# Patient Record
Sex: Female | Born: 1935 | Race: White | Hispanic: No | Smoking: Former smoker
Health system: Southern US, Community
[De-identification: ages and names within clinical notes are randomized; demographics above are authoritative.]

## PROBLEM LIST (undated history)

## (undated) DIAGNOSIS — C679 Malignant neoplasm of bladder, unspecified: Secondary | ICD-10-CM

## (undated) DIAGNOSIS — I739 Peripheral vascular disease, unspecified: Secondary | ICD-10-CM

## (undated) DIAGNOSIS — H3530 Unspecified macular degeneration: Secondary | ICD-10-CM

## (undated) DIAGNOSIS — M316 Other giant cell arteritis: Secondary | ICD-10-CM

## (undated) DIAGNOSIS — D649 Anemia, unspecified: Secondary | ICD-10-CM

## (undated) DIAGNOSIS — I700 Atherosclerosis of aorta: Secondary | ICD-10-CM

## (undated) DIAGNOSIS — E785 Hyperlipidemia, unspecified: Secondary | ICD-10-CM

## (undated) DIAGNOSIS — Z955 Presence of coronary angioplasty implant and graft: Secondary | ICD-10-CM

## (undated) DIAGNOSIS — E119 Type 2 diabetes mellitus without complications: Secondary | ICD-10-CM

## (undated) DIAGNOSIS — I10 Essential (primary) hypertension: Secondary | ICD-10-CM

## (undated) DIAGNOSIS — M810 Age-related osteoporosis without current pathological fracture: Secondary | ICD-10-CM

## (undated) DIAGNOSIS — M069 Rheumatoid arthritis, unspecified: Secondary | ICD-10-CM

## (undated) DIAGNOSIS — Z87891 Personal history of nicotine dependence: Secondary | ICD-10-CM

## (undated) DIAGNOSIS — I519 Heart disease, unspecified: Secondary | ICD-10-CM

## (undated) DIAGNOSIS — I2510 Atherosclerotic heart disease of native coronary artery without angina pectoris: Secondary | ICD-10-CM

## (undated) HISTORY — PX: CATARACT EXTRACTION: SUR2

## (undated) HISTORY — PX: AORTO-FEMORAL BYPASS GRAFT: SHX885

## (undated) HISTORY — DX: Anemia, unspecified: D64.9

## (undated) HISTORY — DX: Other giant cell arteritis: M31.6

## (undated) HISTORY — DX: Personal history of nicotine dependence: Z87.891

## (undated) HISTORY — DX: Heart disease, unspecified: I51.9

## (undated) HISTORY — DX: Peripheral vascular disease, unspecified: I73.9

## (undated) HISTORY — DX: Presence of coronary angioplasty implant and graft: Z95.5

## (undated) HISTORY — DX: Malignant neoplasm of bladder, unspecified: C67.9

## (undated) HISTORY — DX: Type 2 diabetes mellitus without complications: E11.9

## (undated) HISTORY — DX: Age-related osteoporosis without current pathological fracture: M81.0

## (undated) HISTORY — PX: HERNIA REPAIR: SHX51

## (undated) HISTORY — DX: Atherosclerosis of aorta: I70.0

## (undated) HISTORY — DX: Unspecified macular degeneration: H35.30

## (undated) HISTORY — DX: Hyperlipidemia, unspecified: E78.5

## (undated) HISTORY — DX: Essential (primary) hypertension: I10

## (undated) HISTORY — DX: Rheumatoid arthritis, unspecified: M06.9

## (undated) HISTORY — DX: Atherosclerotic heart disease of native coronary artery without angina pectoris: I25.10

## (undated) LAB — HM DIABETES EYE EXAM

---

## 2010-02-10 DIAGNOSIS — C679 Malignant neoplasm of bladder, unspecified: Secondary | ICD-10-CM | POA: Insufficient documentation

## 2010-02-10 HISTORY — DX: Malignant neoplasm of bladder, unspecified: C67.9

## 2011-04-06 HISTORY — PX: KIDNEY SURGERY: SHX687

## 2011-05-05 HISTORY — PX: URETER SURGERY: SHX823

## 2016-02-10 DIAGNOSIS — Z0000 Encounter for general adult medical examination without abnormal findings: Secondary | ICD-10-CM | POA: Diagnosis not present

## 2016-02-18 DIAGNOSIS — I259 Chronic ischemic heart disease, unspecified: Secondary | ICD-10-CM | POA: Diagnosis not present

## 2016-02-18 DIAGNOSIS — F79 Unspecified intellectual disabilities: Secondary | ICD-10-CM | POA: Diagnosis not present

## 2016-02-18 DIAGNOSIS — D519 Vitamin B12 deficiency anemia, unspecified: Secondary | ICD-10-CM | POA: Diagnosis not present

## 2016-02-18 DIAGNOSIS — N390 Urinary tract infection, site not specified: Secondary | ICD-10-CM | POA: Diagnosis not present

## 2016-02-18 DIAGNOSIS — N183 Chronic kidney disease, stage 3 (moderate): Secondary | ICD-10-CM | POA: Diagnosis not present

## 2016-02-18 DIAGNOSIS — E119 Type 2 diabetes mellitus without complications: Secondary | ICD-10-CM | POA: Diagnosis not present

## 2016-02-18 DIAGNOSIS — I739 Peripheral vascular disease, unspecified: Secondary | ICD-10-CM | POA: Diagnosis not present

## 2016-02-26 DIAGNOSIS — M316 Other giant cell arteritis: Secondary | ICD-10-CM | POA: Diagnosis not present

## 2016-03-01 DIAGNOSIS — I70203 Unspecified atherosclerosis of native arteries of extremities, bilateral legs: Secondary | ICD-10-CM | POA: Diagnosis not present

## 2016-03-01 DIAGNOSIS — I6529 Occlusion and stenosis of unspecified carotid artery: Secondary | ICD-10-CM | POA: Diagnosis not present

## 2016-03-01 DIAGNOSIS — I739 Peripheral vascular disease, unspecified: Secondary | ICD-10-CM | POA: Diagnosis not present

## 2016-03-09 DIAGNOSIS — Z961 Presence of intraocular lens: Secondary | ICD-10-CM | POA: Diagnosis not present

## 2016-03-09 DIAGNOSIS — C679 Malignant neoplasm of bladder, unspecified: Secondary | ICD-10-CM | POA: Diagnosis not present

## 2016-03-09 DIAGNOSIS — H35372 Puckering of macula, left eye: Secondary | ICD-10-CM | POA: Diagnosis not present

## 2016-03-09 DIAGNOSIS — R8299 Other abnormal findings in urine: Secondary | ICD-10-CM | POA: Diagnosis not present

## 2016-03-09 DIAGNOSIS — H353131 Nonexudative age-related macular degeneration, bilateral, early dry stage: Secondary | ICD-10-CM | POA: Diagnosis not present

## 2016-03-10 DIAGNOSIS — Z0000 Encounter for general adult medical examination without abnormal findings: Secondary | ICD-10-CM | POA: Diagnosis not present

## 2016-03-24 DIAGNOSIS — C679 Malignant neoplasm of bladder, unspecified: Secondary | ICD-10-CM | POA: Diagnosis not present

## 2016-06-15 DIAGNOSIS — I739 Peripheral vascular disease, unspecified: Secondary | ICD-10-CM | POA: Diagnosis not present

## 2016-06-15 DIAGNOSIS — E119 Type 2 diabetes mellitus without complications: Secondary | ICD-10-CM | POA: Diagnosis not present

## 2016-06-15 DIAGNOSIS — Z6822 Body mass index (BMI) 22.0-22.9, adult: Secondary | ICD-10-CM | POA: Diagnosis not present

## 2016-06-15 DIAGNOSIS — I10 Essential (primary) hypertension: Secondary | ICD-10-CM | POA: Diagnosis not present

## 2016-06-25 DIAGNOSIS — R52 Pain, unspecified: Secondary | ICD-10-CM | POA: Diagnosis not present

## 2016-06-25 DIAGNOSIS — S43421D Sprain of right rotator cuff capsule, subsequent encounter: Secondary | ICD-10-CM | POA: Diagnosis not present

## 2016-06-29 DIAGNOSIS — M25562 Pain in left knee: Secondary | ICD-10-CM | POA: Diagnosis not present

## 2016-06-29 DIAGNOSIS — G8929 Other chronic pain: Secondary | ICD-10-CM | POA: Diagnosis not present

## 2016-06-29 DIAGNOSIS — M25561 Pain in right knee: Secondary | ICD-10-CM | POA: Diagnosis not present

## 2016-07-14 DIAGNOSIS — G8929 Other chronic pain: Secondary | ICD-10-CM | POA: Diagnosis not present

## 2016-07-14 DIAGNOSIS — M25561 Pain in right knee: Secondary | ICD-10-CM | POA: Diagnosis not present

## 2016-07-14 DIAGNOSIS — M25562 Pain in left knee: Secondary | ICD-10-CM | POA: Diagnosis not present

## 2016-07-14 DIAGNOSIS — S43421D Sprain of right rotator cuff capsule, subsequent encounter: Secondary | ICD-10-CM | POA: Diagnosis not present

## 2016-08-25 DIAGNOSIS — Z1231 Encounter for screening mammogram for malignant neoplasm of breast: Secondary | ICD-10-CM | POA: Diagnosis not present

## 2016-09-02 DIAGNOSIS — I739 Peripheral vascular disease, unspecified: Secondary | ICD-10-CM | POA: Diagnosis not present

## 2016-09-02 DIAGNOSIS — I6523 Occlusion and stenosis of bilateral carotid arteries: Secondary | ICD-10-CM | POA: Diagnosis not present

## 2016-09-02 DIAGNOSIS — I70311 Atherosclerosis of unspecified type of bypass graft(s) of the extremities with intermittent claudication, right leg: Secondary | ICD-10-CM | POA: Diagnosis not present

## 2016-09-20 DIAGNOSIS — H538 Other visual disturbances: Secondary | ICD-10-CM | POA: Diagnosis not present

## 2016-09-20 DIAGNOSIS — H35372 Puckering of macula, left eye: Secondary | ICD-10-CM | POA: Diagnosis not present

## 2016-09-20 DIAGNOSIS — H353131 Nonexudative age-related macular degeneration, bilateral, early dry stage: Secondary | ICD-10-CM | POA: Diagnosis not present

## 2016-09-20 DIAGNOSIS — H04123 Dry eye syndrome of bilateral lacrimal glands: Secondary | ICD-10-CM | POA: Diagnosis not present

## 2016-09-21 DIAGNOSIS — Z823 Family history of stroke: Secondary | ICD-10-CM | POA: Diagnosis not present

## 2016-09-21 DIAGNOSIS — I10 Essential (primary) hypertension: Secondary | ICD-10-CM | POA: Diagnosis not present

## 2016-09-21 DIAGNOSIS — G43509 Persistent migraine aura without cerebral infarction, not intractable, without status migrainosus: Secondary | ICD-10-CM | POA: Diagnosis not present

## 2016-09-21 DIAGNOSIS — Z888 Allergy status to other drugs, medicaments and biological substances status: Secondary | ICD-10-CM | POA: Diagnosis not present

## 2016-09-21 DIAGNOSIS — R110 Nausea: Secondary | ICD-10-CM | POA: Diagnosis not present

## 2016-09-21 DIAGNOSIS — Z906 Acquired absence of other parts of urinary tract: Secondary | ICD-10-CM | POA: Diagnosis not present

## 2016-09-21 DIAGNOSIS — G43501 Persistent migraine aura without cerebral infarction, not intractable, with status migrainosus: Secondary | ICD-10-CM | POA: Diagnosis not present

## 2016-09-21 DIAGNOSIS — K219 Gastro-esophageal reflux disease without esophagitis: Secondary | ICD-10-CM | POA: Diagnosis not present

## 2016-09-21 DIAGNOSIS — H538 Other visual disturbances: Secondary | ICD-10-CM | POA: Diagnosis not present

## 2016-09-21 DIAGNOSIS — E119 Type 2 diabetes mellitus without complications: Secondary | ICD-10-CM | POA: Diagnosis not present

## 2016-09-21 DIAGNOSIS — R9082 White matter disease, unspecified: Secondary | ICD-10-CM | POA: Diagnosis not present

## 2016-09-21 DIAGNOSIS — F411 Generalized anxiety disorder: Secondary | ICD-10-CM | POA: Diagnosis not present

## 2016-09-21 DIAGNOSIS — I2510 Atherosclerotic heart disease of native coronary artery without angina pectoris: Secondary | ICD-10-CM | POA: Diagnosis not present

## 2016-09-21 DIAGNOSIS — Z8554 Personal history of malignant neoplasm of ureter: Secondary | ICD-10-CM | POA: Diagnosis not present

## 2016-09-21 DIAGNOSIS — E785 Hyperlipidemia, unspecified: Secondary | ICD-10-CM | POA: Diagnosis not present

## 2016-09-21 DIAGNOSIS — Z8551 Personal history of malignant neoplasm of bladder: Secondary | ICD-10-CM | POA: Diagnosis not present

## 2016-09-21 DIAGNOSIS — R51 Headache: Secondary | ICD-10-CM | POA: Diagnosis not present

## 2016-09-21 DIAGNOSIS — Z7984 Long term (current) use of oral hypoglycemic drugs: Secondary | ICD-10-CM | POA: Diagnosis not present

## 2016-09-23 DIAGNOSIS — E785 Hyperlipidemia, unspecified: Secondary | ICD-10-CM | POA: Diagnosis not present

## 2016-09-23 DIAGNOSIS — Z79899 Other long term (current) drug therapy: Secondary | ICD-10-CM | POA: Diagnosis not present

## 2016-09-23 DIAGNOSIS — Z7984 Long term (current) use of oral hypoglycemic drugs: Secondary | ICD-10-CM | POA: Diagnosis not present

## 2016-09-23 DIAGNOSIS — Z01812 Encounter for preprocedural laboratory examination: Secondary | ICD-10-CM | POA: Diagnosis not present

## 2016-09-23 DIAGNOSIS — Z01810 Encounter for preprocedural cardiovascular examination: Secondary | ICD-10-CM | POA: Diagnosis not present

## 2016-09-23 DIAGNOSIS — Z7982 Long term (current) use of aspirin: Secondary | ICD-10-CM | POA: Diagnosis not present

## 2016-09-23 DIAGNOSIS — Z955 Presence of coronary angioplasty implant and graft: Secondary | ICD-10-CM | POA: Diagnosis not present

## 2016-09-23 DIAGNOSIS — E119 Type 2 diabetes mellitus without complications: Secondary | ICD-10-CM | POA: Diagnosis not present

## 2016-09-23 DIAGNOSIS — I10 Essential (primary) hypertension: Secondary | ICD-10-CM | POA: Diagnosis not present

## 2016-09-23 DIAGNOSIS — I2510 Atherosclerotic heart disease of native coronary artery without angina pectoris: Secondary | ICD-10-CM | POA: Diagnosis not present

## 2016-09-23 DIAGNOSIS — I70311 Atherosclerosis of unspecified type of bypass graft(s) of the extremities with intermittent claudication, right leg: Secondary | ICD-10-CM | POA: Diagnosis not present

## 2016-09-24 DIAGNOSIS — I70211 Atherosclerosis of native arteries of extremities with intermittent claudication, right leg: Secondary | ICD-10-CM | POA: Diagnosis not present

## 2016-09-24 DIAGNOSIS — E785 Hyperlipidemia, unspecified: Secondary | ICD-10-CM | POA: Diagnosis not present

## 2016-09-24 DIAGNOSIS — E119 Type 2 diabetes mellitus without complications: Secondary | ICD-10-CM | POA: Diagnosis not present

## 2016-09-24 DIAGNOSIS — Z955 Presence of coronary angioplasty implant and graft: Secondary | ICD-10-CM | POA: Diagnosis not present

## 2016-09-24 DIAGNOSIS — I10 Essential (primary) hypertension: Secondary | ICD-10-CM | POA: Diagnosis not present

## 2016-09-24 DIAGNOSIS — I70311 Atherosclerosis of unspecified type of bypass graft(s) of the extremities with intermittent claudication, right leg: Secondary | ICD-10-CM | POA: Diagnosis not present

## 2016-09-24 DIAGNOSIS — I2510 Atherosclerotic heart disease of native coronary artery without angina pectoris: Secondary | ICD-10-CM | POA: Diagnosis not present

## 2016-09-27 DIAGNOSIS — Z6823 Body mass index (BMI) 23.0-23.9, adult: Secondary | ICD-10-CM | POA: Diagnosis not present

## 2016-09-27 DIAGNOSIS — E119 Type 2 diabetes mellitus without complications: Secondary | ICD-10-CM | POA: Diagnosis not present

## 2016-09-27 DIAGNOSIS — I739 Peripheral vascular disease, unspecified: Secondary | ICD-10-CM | POA: Diagnosis not present

## 2016-09-27 DIAGNOSIS — R51 Headache: Secondary | ICD-10-CM | POA: Diagnosis not present

## 2016-09-27 DIAGNOSIS — I672 Cerebral atherosclerosis: Secondary | ICD-10-CM | POA: Diagnosis not present

## 2016-09-30 DIAGNOSIS — I70311 Atherosclerosis of unspecified type of bypass graft(s) of the extremities with intermittent claudication, right leg: Secondary | ICD-10-CM | POA: Diagnosis not present

## 2016-10-07 DIAGNOSIS — H538 Other visual disturbances: Secondary | ICD-10-CM | POA: Diagnosis not present

## 2016-10-07 DIAGNOSIS — R51 Headache: Secondary | ICD-10-CM | POA: Diagnosis not present

## 2016-10-12 DIAGNOSIS — Z23 Encounter for immunization: Secondary | ICD-10-CM | POA: Diagnosis not present

## 2016-10-20 DIAGNOSIS — H353131 Nonexudative age-related macular degeneration, bilateral, early dry stage: Secondary | ICD-10-CM | POA: Diagnosis not present

## 2016-10-20 DIAGNOSIS — R739 Hyperglycemia, unspecified: Secondary | ICD-10-CM | POA: Diagnosis not present

## 2016-10-20 DIAGNOSIS — H8110 Benign paroxysmal vertigo, unspecified ear: Secondary | ICD-10-CM | POA: Diagnosis not present

## 2016-10-20 DIAGNOSIS — H538 Other visual disturbances: Secondary | ICD-10-CM | POA: Diagnosis not present

## 2016-10-20 DIAGNOSIS — E1165 Type 2 diabetes mellitus with hyperglycemia: Secondary | ICD-10-CM | POA: Diagnosis not present

## 2016-10-20 DIAGNOSIS — H81399 Other peripheral vertigo, unspecified ear: Secondary | ICD-10-CM | POA: Diagnosis not present

## 2016-10-20 DIAGNOSIS — H35372 Puckering of macula, left eye: Secondary | ICD-10-CM | POA: Diagnosis not present

## 2016-10-21 DIAGNOSIS — Z6822 Body mass index (BMI) 22.0-22.9, adult: Secondary | ICD-10-CM | POA: Diagnosis not present

## 2016-10-21 DIAGNOSIS — I672 Cerebral atherosclerosis: Secondary | ICD-10-CM | POA: Diagnosis not present

## 2016-10-21 DIAGNOSIS — E119 Type 2 diabetes mellitus without complications: Secondary | ICD-10-CM | POA: Diagnosis not present

## 2016-10-25 DIAGNOSIS — E118 Type 2 diabetes mellitus with unspecified complications: Secondary | ICD-10-CM | POA: Diagnosis not present

## 2016-10-25 DIAGNOSIS — H35372 Puckering of macula, left eye: Secondary | ICD-10-CM | POA: Diagnosis not present

## 2016-10-25 DIAGNOSIS — H538 Other visual disturbances: Secondary | ICD-10-CM | POA: Diagnosis not present

## 2016-10-25 DIAGNOSIS — F419 Anxiety disorder, unspecified: Secondary | ICD-10-CM | POA: Diagnosis not present

## 2016-10-25 DIAGNOSIS — I10 Essential (primary) hypertension: Secondary | ICD-10-CM | POA: Diagnosis not present

## 2016-10-25 DIAGNOSIS — I739 Peripheral vascular disease, unspecified: Secondary | ICD-10-CM | POA: Diagnosis not present

## 2016-10-25 DIAGNOSIS — I25118 Atherosclerotic heart disease of native coronary artery with other forms of angina pectoris: Secondary | ICD-10-CM | POA: Diagnosis not present

## 2016-10-25 DIAGNOSIS — H4710 Unspecified papilledema: Secondary | ICD-10-CM | POA: Diagnosis not present

## 2016-10-25 DIAGNOSIS — E1139 Type 2 diabetes mellitus with other diabetic ophthalmic complication: Secondary | ICD-10-CM | POA: Diagnosis not present

## 2016-10-25 DIAGNOSIS — M316 Other giant cell arteritis: Secondary | ICD-10-CM | POA: Diagnosis not present

## 2016-10-26 DIAGNOSIS — K219 Gastro-esophageal reflux disease without esophagitis: Secondary | ICD-10-CM | POA: Diagnosis present

## 2016-10-26 DIAGNOSIS — Z9114 Patient's other noncompliance with medication regimen: Secondary | ICD-10-CM | POA: Diagnosis not present

## 2016-10-26 DIAGNOSIS — Z881 Allergy status to other antibiotic agents status: Secondary | ICD-10-CM | POA: Diagnosis not present

## 2016-10-26 DIAGNOSIS — I2510 Atherosclerotic heart disease of native coronary artery without angina pectoris: Secondary | ICD-10-CM | POA: Diagnosis present

## 2016-10-26 DIAGNOSIS — I776 Arteritis, unspecified: Secondary | ICD-10-CM | POA: Diagnosis not present

## 2016-10-26 DIAGNOSIS — Z859 Personal history of malignant neoplasm, unspecified: Secondary | ICD-10-CM | POA: Diagnosis not present

## 2016-10-26 DIAGNOSIS — I10 Essential (primary) hypertension: Secondary | ICD-10-CM | POA: Diagnosis present

## 2016-10-26 DIAGNOSIS — D720 Genetic anomalies of leukocytes: Secondary | ICD-10-CM | POA: Diagnosis present

## 2016-10-26 DIAGNOSIS — R718 Other abnormality of red blood cells: Secondary | ICD-10-CM | POA: Diagnosis present

## 2016-10-26 DIAGNOSIS — Z7982 Long term (current) use of aspirin: Secondary | ICD-10-CM | POA: Diagnosis not present

## 2016-10-26 DIAGNOSIS — E1151 Type 2 diabetes mellitus with diabetic peripheral angiopathy without gangrene: Secondary | ICD-10-CM | POA: Diagnosis present

## 2016-10-26 DIAGNOSIS — Z79899 Other long term (current) drug therapy: Secondary | ICD-10-CM | POA: Diagnosis not present

## 2016-10-26 DIAGNOSIS — H538 Other visual disturbances: Secondary | ICD-10-CM | POA: Diagnosis not present

## 2016-10-26 DIAGNOSIS — Z95828 Presence of other vascular implants and grafts: Secondary | ICD-10-CM | POA: Diagnosis not present

## 2016-10-26 DIAGNOSIS — Z888 Allergy status to other drugs, medicaments and biological substances status: Secondary | ICD-10-CM | POA: Diagnosis not present

## 2016-10-26 DIAGNOSIS — H53132 Sudden visual loss, left eye: Secondary | ICD-10-CM | POA: Diagnosis not present

## 2016-10-26 DIAGNOSIS — F411 Generalized anxiety disorder: Secondary | ICD-10-CM | POA: Diagnosis present

## 2016-10-26 DIAGNOSIS — E785 Hyperlipidemia, unspecified: Secondary | ICD-10-CM | POA: Diagnosis present

## 2016-10-26 DIAGNOSIS — Z7984 Long term (current) use of oral hypoglycemic drugs: Secondary | ICD-10-CM | POA: Diagnosis not present

## 2016-10-26 DIAGNOSIS — E1139 Type 2 diabetes mellitus with other diabetic ophthalmic complication: Secondary | ICD-10-CM | POA: Diagnosis present

## 2016-10-26 DIAGNOSIS — I25118 Atherosclerotic heart disease of native coronary artery with other forms of angina pectoris: Secondary | ICD-10-CM | POA: Diagnosis not present

## 2016-10-26 DIAGNOSIS — M316 Other giant cell arteritis: Secondary | ICD-10-CM | POA: Diagnosis present

## 2016-10-26 DIAGNOSIS — I739 Peripheral vascular disease, unspecified: Secondary | ICD-10-CM | POA: Diagnosis not present

## 2016-10-26 DIAGNOSIS — D649 Anemia, unspecified: Secondary | ICD-10-CM | POA: Diagnosis present

## 2016-10-26 DIAGNOSIS — E118 Type 2 diabetes mellitus with unspecified complications: Secondary | ICD-10-CM | POA: Diagnosis not present

## 2016-10-26 DIAGNOSIS — Z9889 Other specified postprocedural states: Secondary | ICD-10-CM | POA: Diagnosis not present

## 2016-11-02 DIAGNOSIS — I739 Peripheral vascular disease, unspecified: Secondary | ICD-10-CM | POA: Diagnosis present

## 2016-11-02 DIAGNOSIS — Z8551 Personal history of malignant neoplasm of bladder: Secondary | ICD-10-CM | POA: Diagnosis not present

## 2016-11-02 DIAGNOSIS — D649 Anemia, unspecified: Secondary | ICD-10-CM | POA: Diagnosis present

## 2016-11-02 DIAGNOSIS — R6884 Jaw pain: Secondary | ICD-10-CM | POA: Diagnosis present

## 2016-11-02 DIAGNOSIS — E113291 Type 2 diabetes mellitus with mild nonproliferative diabetic retinopathy without macular edema, right eye: Secondary | ICD-10-CM | POA: Diagnosis not present

## 2016-11-02 DIAGNOSIS — E1165 Type 2 diabetes mellitus with hyperglycemia: Secondary | ICD-10-CM | POA: Diagnosis present

## 2016-11-02 DIAGNOSIS — Z7982 Long term (current) use of aspirin: Secondary | ICD-10-CM | POA: Diagnosis not present

## 2016-11-02 DIAGNOSIS — M316 Other giant cell arteritis: Secondary | ICD-10-CM | POA: Diagnosis present

## 2016-11-02 DIAGNOSIS — E118 Type 2 diabetes mellitus with unspecified complications: Secondary | ICD-10-CM | POA: Diagnosis not present

## 2016-11-02 DIAGNOSIS — E785 Hyperlipidemia, unspecified: Secondary | ICD-10-CM | POA: Diagnosis present

## 2016-11-02 DIAGNOSIS — N179 Acute kidney failure, unspecified: Secondary | ICD-10-CM | POA: Diagnosis present

## 2016-11-02 DIAGNOSIS — H547 Unspecified visual loss: Secondary | ICD-10-CM | POA: Diagnosis present

## 2016-11-02 DIAGNOSIS — Z955 Presence of coronary angioplasty implant and graft: Secondary | ICD-10-CM | POA: Diagnosis not present

## 2016-11-02 DIAGNOSIS — Z7952 Long term (current) use of systemic steroids: Secondary | ICD-10-CM | POA: Diagnosis not present

## 2016-11-02 DIAGNOSIS — Z9842 Cataract extraction status, left eye: Secondary | ICD-10-CM | POA: Diagnosis not present

## 2016-11-02 DIAGNOSIS — I2510 Atherosclerotic heart disease of native coronary artery without angina pectoris: Secondary | ICD-10-CM | POA: Diagnosis present

## 2016-11-02 DIAGNOSIS — Z90711 Acquired absence of uterus with remaining cervical stump: Secondary | ICD-10-CM | POA: Diagnosis not present

## 2016-11-02 DIAGNOSIS — H47012 Ischemic optic neuropathy, left eye: Secondary | ICD-10-CM | POA: Diagnosis not present

## 2016-11-02 DIAGNOSIS — Z9841 Cataract extraction status, right eye: Secondary | ICD-10-CM | POA: Diagnosis not present

## 2016-11-02 DIAGNOSIS — Z7902 Long term (current) use of antithrombotics/antiplatelets: Secondary | ICD-10-CM | POA: Diagnosis not present

## 2016-11-02 DIAGNOSIS — Z87891 Personal history of nicotine dependence: Secondary | ICD-10-CM | POA: Diagnosis not present

## 2016-11-02 DIAGNOSIS — Z95828 Presence of other vascular implants and grafts: Secondary | ICD-10-CM | POA: Diagnosis not present

## 2016-11-02 DIAGNOSIS — I10 Essential (primary) hypertension: Secondary | ICD-10-CM | POA: Diagnosis present

## 2016-11-02 DIAGNOSIS — Z7984 Long term (current) use of oral hypoglycemic drugs: Secondary | ICD-10-CM | POA: Diagnosis not present

## 2016-11-02 DIAGNOSIS — Z951 Presence of aortocoronary bypass graft: Secondary | ICD-10-CM | POA: Diagnosis not present

## 2016-11-09 DIAGNOSIS — M2550 Pain in unspecified joint: Secondary | ICD-10-CM | POA: Diagnosis not present

## 2016-11-09 DIAGNOSIS — Z87891 Personal history of nicotine dependence: Secondary | ICD-10-CM | POA: Diagnosis not present

## 2016-11-09 DIAGNOSIS — M316 Other giant cell arteritis: Secondary | ICD-10-CM | POA: Diagnosis not present

## 2016-11-09 DIAGNOSIS — Z111 Encounter for screening for respiratory tuberculosis: Secondary | ICD-10-CM | POA: Diagnosis not present

## 2016-11-15 DIAGNOSIS — H47012 Ischemic optic neuropathy, left eye: Secondary | ICD-10-CM | POA: Diagnosis not present

## 2016-11-15 DIAGNOSIS — M316 Other giant cell arteritis: Secondary | ICD-10-CM | POA: Diagnosis not present

## 2016-11-16 DIAGNOSIS — Z7952 Long term (current) use of systemic steroids: Secondary | ICD-10-CM | POA: Diagnosis not present

## 2016-11-16 LAB — HM DEXA SCAN

## 2016-11-17 DIAGNOSIS — R5383 Other fatigue: Secondary | ICD-10-CM | POA: Diagnosis not present

## 2016-11-17 DIAGNOSIS — Z125 Encounter for screening for malignant neoplasm of prostate: Secondary | ICD-10-CM | POA: Diagnosis not present

## 2016-11-17 DIAGNOSIS — M0609 Rheumatoid arthritis without rheumatoid factor, multiple sites: Secondary | ICD-10-CM | POA: Diagnosis not present

## 2016-11-24 DIAGNOSIS — R7612 Nonspecific reaction to cell mediated immunity measurement of gamma interferon antigen response without active tuberculosis: Secondary | ICD-10-CM | POA: Diagnosis not present

## 2016-12-02 DIAGNOSIS — Z87891 Personal history of nicotine dependence: Secondary | ICD-10-CM | POA: Diagnosis not present

## 2016-12-02 DIAGNOSIS — Z7952 Long term (current) use of systemic steroids: Secondary | ICD-10-CM | POA: Diagnosis not present

## 2016-12-02 DIAGNOSIS — E119 Type 2 diabetes mellitus without complications: Secondary | ICD-10-CM | POA: Diagnosis not present

## 2016-12-02 DIAGNOSIS — M316 Other giant cell arteritis: Secondary | ICD-10-CM | POA: Diagnosis not present

## 2016-12-08 DIAGNOSIS — Z08 Encounter for follow-up examination after completed treatment for malignant neoplasm: Secondary | ICD-10-CM | POA: Diagnosis not present

## 2016-12-08 DIAGNOSIS — C44619 Basal cell carcinoma of skin of left upper limb, including shoulder: Secondary | ICD-10-CM | POA: Diagnosis not present

## 2016-12-08 DIAGNOSIS — Z85828 Personal history of other malignant neoplasm of skin: Secondary | ICD-10-CM | POA: Diagnosis not present

## 2016-12-08 DIAGNOSIS — L821 Other seborrheic keratosis: Secondary | ICD-10-CM | POA: Diagnosis not present

## 2016-12-16 DIAGNOSIS — D509 Iron deficiency anemia, unspecified: Secondary | ICD-10-CM | POA: Diagnosis not present

## 2016-12-16 DIAGNOSIS — R1013 Epigastric pain: Secondary | ICD-10-CM | POA: Diagnosis not present

## 2016-12-16 DIAGNOSIS — K5900 Constipation, unspecified: Secondary | ICD-10-CM | POA: Diagnosis not present

## 2016-12-16 DIAGNOSIS — Z86010 Personal history of colonic polyps: Secondary | ICD-10-CM | POA: Diagnosis not present

## 2016-12-16 DIAGNOSIS — R12 Heartburn: Secondary | ICD-10-CM | POA: Diagnosis not present

## 2016-12-20 DIAGNOSIS — D509 Iron deficiency anemia, unspecified: Secondary | ICD-10-CM | POA: Diagnosis not present

## 2016-12-20 DIAGNOSIS — Z7952 Long term (current) use of systemic steroids: Secondary | ICD-10-CM | POA: Diagnosis not present

## 2016-12-20 DIAGNOSIS — I10 Essential (primary) hypertension: Secondary | ICD-10-CM | POA: Diagnosis not present

## 2016-12-20 DIAGNOSIS — E785 Hyperlipidemia, unspecified: Secondary | ICD-10-CM | POA: Diagnosis not present

## 2016-12-20 DIAGNOSIS — E1165 Type 2 diabetes mellitus with hyperglycemia: Secondary | ICD-10-CM | POA: Diagnosis not present

## 2016-12-20 DIAGNOSIS — M0609 Rheumatoid arthritis without rheumatoid factor, multiple sites: Secondary | ICD-10-CM | POA: Diagnosis not present

## 2016-12-23 DIAGNOSIS — I70203 Unspecified atherosclerosis of native arteries of extremities, bilateral legs: Secondary | ICD-10-CM | POA: Diagnosis not present

## 2016-12-23 DIAGNOSIS — I70311 Atherosclerosis of unspecified type of bypass graft(s) of the extremities with intermittent claudication, right leg: Secondary | ICD-10-CM | POA: Diagnosis not present

## 2016-12-23 DIAGNOSIS — I6523 Occlusion and stenosis of bilateral carotid arteries: Secondary | ICD-10-CM | POA: Diagnosis not present

## 2016-12-27 DIAGNOSIS — H47012 Ischemic optic neuropathy, left eye: Secondary | ICD-10-CM | POA: Diagnosis not present

## 2016-12-27 DIAGNOSIS — I10 Essential (primary) hypertension: Secondary | ICD-10-CM | POA: Diagnosis not present

## 2016-12-27 DIAGNOSIS — M316 Other giant cell arteritis: Secondary | ICD-10-CM | POA: Diagnosis not present

## 2016-12-27 DIAGNOSIS — H5010 Unspecified exotropia: Secondary | ICD-10-CM | POA: Diagnosis not present

## 2016-12-29 DIAGNOSIS — E785 Hyperlipidemia, unspecified: Secondary | ICD-10-CM | POA: Diagnosis not present

## 2016-12-29 DIAGNOSIS — E1165 Type 2 diabetes mellitus with hyperglycemia: Secondary | ICD-10-CM | POA: Diagnosis not present

## 2017-01-06 DIAGNOSIS — R600 Localized edema: Secondary | ICD-10-CM | POA: Diagnosis not present

## 2017-01-06 DIAGNOSIS — Z87891 Personal history of nicotine dependence: Secondary | ICD-10-CM | POA: Diagnosis not present

## 2017-01-06 DIAGNOSIS — M316 Other giant cell arteritis: Secondary | ICD-10-CM | POA: Diagnosis not present

## 2017-01-06 DIAGNOSIS — Z7952 Long term (current) use of systemic steroids: Secondary | ICD-10-CM | POA: Diagnosis not present

## 2017-01-17 DIAGNOSIS — M316 Other giant cell arteritis: Secondary | ICD-10-CM | POA: Diagnosis not present

## 2017-01-21 DIAGNOSIS — Z7952 Long term (current) use of systemic steroids: Secondary | ICD-10-CM | POA: Diagnosis not present

## 2017-01-21 DIAGNOSIS — E1165 Type 2 diabetes mellitus with hyperglycemia: Secondary | ICD-10-CM | POA: Diagnosis not present

## 2017-01-21 DIAGNOSIS — I10 Essential (primary) hypertension: Secondary | ICD-10-CM | POA: Diagnosis not present

## 2017-01-21 DIAGNOSIS — M316 Other giant cell arteritis: Secondary | ICD-10-CM | POA: Diagnosis not present

## 2017-01-25 DIAGNOSIS — M0609 Rheumatoid arthritis without rheumatoid factor, multiple sites: Secondary | ICD-10-CM | POA: Diagnosis not present

## 2017-02-24 DIAGNOSIS — M316 Other giant cell arteritis: Secondary | ICD-10-CM | POA: Diagnosis not present

## 2017-02-24 DIAGNOSIS — E1165 Type 2 diabetes mellitus with hyperglycemia: Secondary | ICD-10-CM | POA: Diagnosis not present

## 2017-02-24 DIAGNOSIS — R600 Localized edema: Secondary | ICD-10-CM | POA: Diagnosis not present

## 2017-02-24 DIAGNOSIS — M25552 Pain in left hip: Secondary | ICD-10-CM | POA: Diagnosis not present

## 2017-02-24 DIAGNOSIS — M0609 Rheumatoid arthritis without rheumatoid factor, multiple sites: Secondary | ICD-10-CM | POA: Diagnosis not present

## 2017-03-01 DIAGNOSIS — E113292 Type 2 diabetes mellitus with mild nonproliferative diabetic retinopathy without macular edema, left eye: Secondary | ICD-10-CM | POA: Diagnosis not present

## 2017-03-01 DIAGNOSIS — H538 Other visual disturbances: Secondary | ICD-10-CM | POA: Diagnosis not present

## 2017-03-01 DIAGNOSIS — H04123 Dry eye syndrome of bilateral lacrimal glands: Secondary | ICD-10-CM | POA: Diagnosis not present

## 2017-03-01 DIAGNOSIS — M316 Other giant cell arteritis: Secondary | ICD-10-CM | POA: Diagnosis not present

## 2017-03-01 DIAGNOSIS — H47012 Ischemic optic neuropathy, left eye: Secondary | ICD-10-CM | POA: Diagnosis not present

## 2017-03-24 DIAGNOSIS — M0609 Rheumatoid arthritis without rheumatoid factor, multiple sites: Secondary | ICD-10-CM | POA: Diagnosis not present

## 2017-03-28 DIAGNOSIS — I70311 Atherosclerosis of unspecified type of bypass graft(s) of the extremities with intermittent claudication, right leg: Secondary | ICD-10-CM | POA: Diagnosis not present

## 2017-03-29 DIAGNOSIS — I70203 Unspecified atherosclerosis of native arteries of extremities, bilateral legs: Secondary | ICD-10-CM | POA: Diagnosis not present

## 2017-03-29 DIAGNOSIS — I872 Venous insufficiency (chronic) (peripheral): Secondary | ICD-10-CM | POA: Diagnosis not present

## 2017-04-28 DIAGNOSIS — M0609 Rheumatoid arthritis without rheumatoid factor, multiple sites: Secondary | ICD-10-CM | POA: Diagnosis not present

## 2017-04-28 DIAGNOSIS — E1165 Type 2 diabetes mellitus with hyperglycemia: Secondary | ICD-10-CM | POA: Diagnosis not present

## 2017-04-28 DIAGNOSIS — I10 Essential (primary) hypertension: Secondary | ICD-10-CM | POA: Diagnosis not present

## 2017-04-28 DIAGNOSIS — R600 Localized edema: Secondary | ICD-10-CM | POA: Diagnosis not present

## 2017-04-28 DIAGNOSIS — M316 Other giant cell arteritis: Secondary | ICD-10-CM | POA: Diagnosis not present

## 2017-05-02 DIAGNOSIS — I6529 Occlusion and stenosis of unspecified carotid artery: Secondary | ICD-10-CM | POA: Diagnosis not present

## 2017-05-09 DIAGNOSIS — I6523 Occlusion and stenosis of bilateral carotid arteries: Secondary | ICD-10-CM | POA: Diagnosis not present

## 2017-05-17 DIAGNOSIS — R1310 Dysphagia, unspecified: Secondary | ICD-10-CM | POA: Diagnosis not present

## 2017-05-17 DIAGNOSIS — M1990 Unspecified osteoarthritis, unspecified site: Secondary | ICD-10-CM | POA: Diagnosis not present

## 2017-05-17 DIAGNOSIS — E119 Type 2 diabetes mellitus without complications: Secondary | ICD-10-CM | POA: Diagnosis not present

## 2017-05-17 DIAGNOSIS — Z86010 Personal history of colonic polyps: Secondary | ICD-10-CM | POA: Diagnosis not present

## 2017-05-17 DIAGNOSIS — K219 Gastro-esophageal reflux disease without esophagitis: Secondary | ICD-10-CM | POA: Diagnosis not present

## 2017-05-23 DIAGNOSIS — K648 Other hemorrhoids: Secondary | ICD-10-CM | POA: Diagnosis not present

## 2017-05-23 DIAGNOSIS — K5730 Diverticulosis of large intestine without perforation or abscess without bleeding: Secondary | ICD-10-CM | POA: Diagnosis not present

## 2017-05-23 DIAGNOSIS — Z86010 Personal history of colonic polyps: Secondary | ICD-10-CM | POA: Diagnosis not present

## 2017-05-23 DIAGNOSIS — Z1211 Encounter for screening for malignant neoplasm of colon: Secondary | ICD-10-CM | POA: Diagnosis not present

## 2017-05-24 DIAGNOSIS — R1310 Dysphagia, unspecified: Secondary | ICD-10-CM | POA: Diagnosis not present

## 2017-05-24 DIAGNOSIS — K2970 Gastritis, unspecified, without bleeding: Secondary | ICD-10-CM | POA: Diagnosis not present

## 2017-05-24 DIAGNOSIS — K314 Gastric diverticulum: Secondary | ICD-10-CM | POA: Diagnosis not present

## 2017-05-24 DIAGNOSIS — K222 Esophageal obstruction: Secondary | ICD-10-CM | POA: Diagnosis not present

## 2017-05-24 DIAGNOSIS — K449 Diaphragmatic hernia without obstruction or gangrene: Secondary | ICD-10-CM | POA: Diagnosis not present

## 2017-06-02 DIAGNOSIS — M0609 Rheumatoid arthritis without rheumatoid factor, multiple sites: Secondary | ICD-10-CM | POA: Diagnosis not present

## 2017-06-07 DIAGNOSIS — K219 Gastro-esophageal reflux disease without esophagitis: Secondary | ICD-10-CM | POA: Diagnosis not present

## 2017-06-07 DIAGNOSIS — K222 Esophageal obstruction: Secondary | ICD-10-CM | POA: Diagnosis not present

## 2017-06-07 DIAGNOSIS — K5900 Constipation, unspecified: Secondary | ICD-10-CM | POA: Diagnosis not present

## 2017-06-07 DIAGNOSIS — K2970 Gastritis, unspecified, without bleeding: Secondary | ICD-10-CM | POA: Diagnosis not present

## 2017-07-05 DIAGNOSIS — I739 Peripheral vascular disease, unspecified: Secondary | ICD-10-CM | POA: Diagnosis not present

## 2017-07-05 DIAGNOSIS — I70203 Unspecified atherosclerosis of native arteries of extremities, bilateral legs: Secondary | ICD-10-CM | POA: Diagnosis not present

## 2017-07-14 DIAGNOSIS — M0609 Rheumatoid arthritis without rheumatoid factor, multiple sites: Secondary | ICD-10-CM | POA: Diagnosis not present

## 2017-07-22 DIAGNOSIS — E782 Mixed hyperlipidemia: Secondary | ICD-10-CM | POA: Diagnosis not present

## 2017-07-22 DIAGNOSIS — E119 Type 2 diabetes mellitus without complications: Secondary | ICD-10-CM | POA: Diagnosis not present

## 2017-07-22 DIAGNOSIS — D509 Iron deficiency anemia, unspecified: Secondary | ICD-10-CM | POA: Diagnosis not present

## 2017-07-22 DIAGNOSIS — I10 Essential (primary) hypertension: Secondary | ICD-10-CM | POA: Diagnosis not present

## 2017-08-25 DIAGNOSIS — C671 Malignant neoplasm of dome of bladder: Secondary | ICD-10-CM | POA: Diagnosis not present

## 2017-08-25 DIAGNOSIS — C661 Malignant neoplasm of right ureter: Secondary | ICD-10-CM | POA: Diagnosis not present

## 2017-08-31 DIAGNOSIS — Z1231 Encounter for screening mammogram for malignant neoplasm of breast: Secondary | ICD-10-CM | POA: Diagnosis not present

## 2017-09-05 DIAGNOSIS — S22080A Wedge compression fracture of T11-T12 vertebra, initial encounter for closed fracture: Secondary | ICD-10-CM | POA: Diagnosis not present

## 2017-09-05 DIAGNOSIS — K439 Ventral hernia without obstruction or gangrene: Secondary | ICD-10-CM | POA: Diagnosis not present

## 2017-09-05 DIAGNOSIS — C671 Malignant neoplasm of dome of bladder: Secondary | ICD-10-CM | POA: Diagnosis not present

## 2017-09-05 DIAGNOSIS — K5730 Diverticulosis of large intestine without perforation or abscess without bleeding: Secondary | ICD-10-CM | POA: Diagnosis not present

## 2017-09-05 DIAGNOSIS — N281 Cyst of kidney, acquired: Secondary | ICD-10-CM | POA: Diagnosis not present

## 2017-09-05 DIAGNOSIS — C661 Malignant neoplasm of right ureter: Secondary | ICD-10-CM | POA: Diagnosis not present

## 2017-09-05 DIAGNOSIS — S72052A Unspecified fracture of head of left femur, initial encounter for closed fracture: Secondary | ICD-10-CM | POA: Diagnosis not present

## 2017-09-07 DIAGNOSIS — D649 Anemia, unspecified: Secondary | ICD-10-CM | POA: Diagnosis not present

## 2017-09-08 DIAGNOSIS — D509 Iron deficiency anemia, unspecified: Secondary | ICD-10-CM | POA: Diagnosis not present

## 2017-09-30 DIAGNOSIS — D509 Iron deficiency anemia, unspecified: Secondary | ICD-10-CM | POA: Diagnosis not present

## 2017-09-30 DIAGNOSIS — E119 Type 2 diabetes mellitus without complications: Secondary | ICD-10-CM | POA: Diagnosis not present

## 2017-09-30 DIAGNOSIS — I10 Essential (primary) hypertension: Secondary | ICD-10-CM | POA: Diagnosis not present

## 2017-09-30 DIAGNOSIS — M316 Other giant cell arteritis: Secondary | ICD-10-CM | POA: Diagnosis not present

## 2017-10-05 DIAGNOSIS — M0609 Rheumatoid arthritis without rheumatoid factor, multiple sites: Secondary | ICD-10-CM | POA: Diagnosis not present

## 2017-10-06 DIAGNOSIS — I70203 Unspecified atherosclerosis of native arteries of extremities, bilateral legs: Secondary | ICD-10-CM | POA: Diagnosis not present

## 2017-10-06 DIAGNOSIS — I70311 Atherosclerosis of unspecified type of bypass graft(s) of the extremities with intermittent claudication, right leg: Secondary | ICD-10-CM | POA: Diagnosis not present

## 2017-10-09 DIAGNOSIS — I10 Essential (primary) hypertension: Secondary | ICD-10-CM | POA: Diagnosis not present

## 2017-10-09 DIAGNOSIS — S299XXA Unspecified injury of thorax, initial encounter: Secondary | ICD-10-CM | POA: Diagnosis not present

## 2017-10-09 DIAGNOSIS — R079 Chest pain, unspecified: Secondary | ICD-10-CM | POA: Diagnosis not present

## 2017-10-09 DIAGNOSIS — Z87891 Personal history of nicotine dependence: Secondary | ICD-10-CM | POA: Diagnosis not present

## 2017-10-09 DIAGNOSIS — M545 Low back pain: Secondary | ICD-10-CM | POA: Diagnosis not present

## 2017-10-09 DIAGNOSIS — W19XXXA Unspecified fall, initial encounter: Secondary | ICD-10-CM | POA: Diagnosis not present

## 2017-10-09 DIAGNOSIS — Z955 Presence of coronary angioplasty implant and graft: Secondary | ICD-10-CM | POA: Diagnosis not present

## 2017-10-09 DIAGNOSIS — Z951 Presence of aortocoronary bypass graft: Secondary | ICD-10-CM | POA: Diagnosis not present

## 2017-10-09 DIAGNOSIS — S20211A Contusion of right front wall of thorax, initial encounter: Secondary | ICD-10-CM | POA: Diagnosis not present

## 2017-10-09 DIAGNOSIS — E785 Hyperlipidemia, unspecified: Secondary | ICD-10-CM | POA: Diagnosis not present

## 2017-10-09 DIAGNOSIS — E1151 Type 2 diabetes mellitus with diabetic peripheral angiopathy without gangrene: Secondary | ICD-10-CM | POA: Diagnosis not present

## 2017-10-11 DIAGNOSIS — Z23 Encounter for immunization: Secondary | ICD-10-CM | POA: Diagnosis not present

## 2017-10-12 DIAGNOSIS — M0609 Rheumatoid arthritis without rheumatoid factor, multiple sites: Secondary | ICD-10-CM | POA: Diagnosis not present

## 2017-10-18 DIAGNOSIS — H353131 Nonexudative age-related macular degeneration, bilateral, early dry stage: Secondary | ICD-10-CM | POA: Diagnosis not present

## 2017-10-18 DIAGNOSIS — Z961 Presence of intraocular lens: Secondary | ICD-10-CM | POA: Diagnosis not present

## 2017-10-18 DIAGNOSIS — H3561 Retinal hemorrhage, right eye: Secondary | ICD-10-CM | POA: Diagnosis not present

## 2017-11-03 DIAGNOSIS — M0609 Rheumatoid arthritis without rheumatoid factor, multiple sites: Secondary | ICD-10-CM | POA: Diagnosis not present

## 2017-11-03 DIAGNOSIS — M316 Other giant cell arteritis: Secondary | ICD-10-CM | POA: Diagnosis not present

## 2017-11-03 DIAGNOSIS — M810 Age-related osteoporosis without current pathological fracture: Secondary | ICD-10-CM | POA: Diagnosis not present

## 2017-11-03 DIAGNOSIS — D509 Iron deficiency anemia, unspecified: Secondary | ICD-10-CM | POA: Diagnosis not present

## 2017-11-16 DIAGNOSIS — I10 Essential (primary) hypertension: Secondary | ICD-10-CM | POA: Diagnosis not present

## 2017-11-16 DIAGNOSIS — I739 Peripheral vascular disease, unspecified: Secondary | ICD-10-CM | POA: Diagnosis not present

## 2017-11-16 DIAGNOSIS — I2510 Atherosclerotic heart disease of native coronary artery without angina pectoris: Secondary | ICD-10-CM | POA: Diagnosis not present

## 2017-11-16 DIAGNOSIS — E782 Mixed hyperlipidemia: Secondary | ICD-10-CM | POA: Diagnosis not present

## 2017-11-16 DIAGNOSIS — D649 Anemia, unspecified: Secondary | ICD-10-CM | POA: Diagnosis not present

## 2017-11-16 DIAGNOSIS — H3560 Retinal hemorrhage, unspecified eye: Secondary | ICD-10-CM | POA: Diagnosis not present

## 2017-12-05 DIAGNOSIS — E1165 Type 2 diabetes mellitus with hyperglycemia: Secondary | ICD-10-CM | POA: Diagnosis not present

## 2017-12-05 DIAGNOSIS — M0609 Rheumatoid arthritis without rheumatoid factor, multiple sites: Secondary | ICD-10-CM | POA: Diagnosis not present

## 2017-12-05 DIAGNOSIS — R05 Cough: Secondary | ICD-10-CM | POA: Diagnosis not present

## 2017-12-05 DIAGNOSIS — I700 Atherosclerosis of aorta: Secondary | ICD-10-CM | POA: Diagnosis not present

## 2017-12-05 DIAGNOSIS — Z111 Encounter for screening for respiratory tuberculosis: Secondary | ICD-10-CM | POA: Diagnosis not present

## 2017-12-08 ENCOUNTER — Encounter: Payer: Self-pay | Admitting: Family Medicine

## 2017-12-08 ENCOUNTER — Ambulatory Visit (INDEPENDENT_AMBULATORY_CARE_PROVIDER_SITE_OTHER): Payer: Medicare Other | Admitting: Family Medicine

## 2017-12-08 VITALS — BP 149/75 | HR 73 | Ht 61.0 in | Wt 103.2 lb

## 2017-12-08 DIAGNOSIS — I10 Essential (primary) hypertension: Secondary | ICD-10-CM | POA: Insufficient documentation

## 2017-12-08 DIAGNOSIS — Z955 Presence of coronary angioplasty implant and graft: Secondary | ICD-10-CM | POA: Diagnosis not present

## 2017-12-08 DIAGNOSIS — I152 Hypertension secondary to endocrine disorders: Secondary | ICD-10-CM | POA: Insufficient documentation

## 2017-12-08 DIAGNOSIS — F4322 Adjustment disorder with anxiety: Secondary | ICD-10-CM

## 2017-12-08 DIAGNOSIS — Z7689 Persons encountering health services in other specified circumstances: Secondary | ICD-10-CM | POA: Diagnosis not present

## 2017-12-08 DIAGNOSIS — E1169 Type 2 diabetes mellitus with other specified complication: Secondary | ICD-10-CM

## 2017-12-08 DIAGNOSIS — E1159 Type 2 diabetes mellitus with other circulatory complications: Secondary | ICD-10-CM | POA: Diagnosis not present

## 2017-12-08 DIAGNOSIS — C679 Malignant neoplasm of bladder, unspecified: Secondary | ICD-10-CM

## 2017-12-08 DIAGNOSIS — I739 Peripheral vascular disease, unspecified: Secondary | ICD-10-CM | POA: Insufficient documentation

## 2017-12-08 DIAGNOSIS — Z95828 Presence of other vascular implants and grafts: Secondary | ICD-10-CM | POA: Diagnosis not present

## 2017-12-08 DIAGNOSIS — E782 Mixed hyperlipidemia: Secondary | ICD-10-CM | POA: Diagnosis not present

## 2017-12-08 DIAGNOSIS — E0822 Diabetes mellitus due to underlying condition with diabetic chronic kidney disease: Secondary | ICD-10-CM | POA: Insufficient documentation

## 2017-12-08 DIAGNOSIS — E119 Type 2 diabetes mellitus without complications: Secondary | ICD-10-CM | POA: Insufficient documentation

## 2017-12-08 DIAGNOSIS — G4700 Insomnia, unspecified: Secondary | ICD-10-CM | POA: Diagnosis not present

## 2017-12-08 NOTE — Patient Instructions (Signed)

## 2017-12-08 NOTE — Progress Notes (Signed)
New patient office visit note:  Impression and Recommendations:    1. Establishing care with new doctor, encounter for   2. Type 2 diabetes mellitus with other specified complication, without long-term current use of insulin ([place])   3. Hypertension associated with diabetes ([place])   4. Mixed diabetic hyperlipidemia associated with type 2 diabetes mellitus ([place])   5. Peripheral vascular disease ([place])   6. Peripheral arterial disease ([place])   7. Malignant neoplasm of urinary bladder, unspecified site ([place])   8. Presence of stent in left circumflex coronary artery   9. S/P aorto-bifemoral bypass surgery   10. Adjustment disorder with anxious mood   11. Insomnia, unspecified type     Cardiological & Vascular Health - (CAD, PVD) - New patient with several stents in place, in need of cardiological follow-up. - Referral placed today (Dr. Gwenlyn Found).  Need for evaluation of CAD and LE vascular disease. - Encouraged patient to establish with Cardiology and follow up as recommended.  - Strongly encouraged patient to call cardiology office and ask to send medical records there.  Diabetes - Stable at this time. - Continue treatment as established.  See med list today. - Will continue to monitor and evaluate at next appointment. - Patient tolerating meds well without complication.  Denies S-E  Hypertension - Ambulatory blood pressure monitoring encouraged. - Sit quietly for 15-20 minutes, then check it, with no stimulation prior. - Check it on Monday morning, Wednesday afternoon, Friday night. - If needed, encouraged patient to have HTN managed by cardiology.  - Discussed goal of 150/90 and less, at her age.  Xanax Use - Sleep Aid, Anxious Mood - Advised patient to discontinue use of xanax. - Reduce xanax to half dose and wean down slowly to discontinue use. - Educated patient in office today about importance of avoiding use of xanax.  - Encouraged patient to use melatonin as  recommended.  BMI Counseling Explained to patient what BMI refers to, and what it means medically.  Told patient to think about it as a "medical risk stratification measurement" and how increasing BMI (or very low BMI) is associated with increasing risk/ or worsening state of various diseases, including premature OA, depression etc.  American Heart Association guidelines for healthy diet, basically Mediterranean diet, and exercise guidelines of 30 minutes 5 days per week or more discussed in detail.  Health counseling performed.  All questions answered.  Lifestyle & Preventative Health Maintenance - Advised patient to continue working toward exercising to improve overall mental, physical, and emotional health.    - Encouraged patient to engage in daily physical activity, especially a formal exercise routine.  Recommended that the patient eventually strive for at least 150 minutes of moderate cardiovascular activity per week according to guidelines established by the [facility]   - Healthy dietary habits encouraged, including low-carb, and high amounts of lean protein in diet.   - Patient should also consume adequate amounts of water.   Education and routine counseling performed. Handouts provided.  Follow-Up - Obtain fasting lab work as recommended. - Patient will establish with rheumatology as desired.  - Strongly advised patient to follow up with specialists as recommended. - Otherwise, continue to return for CPE and chronic follow-up as scheduled.   - Patient knows to call in sooner if desired to address acute concerns.  Pt was in the office today for 32.5+ minutes, with over 50% time spent in face to face counseling of patients various medical conditions, treatment  plans of those medical conditions including medicine management and lifestyle modification, strategies to improve health and well being; and in coordination of care. SEE ABOVE TREATMENT PLAN FOR DETAILS    Do not remove these  below:  Orders Placed This Encounter  Procedures  . Ambulatory referral to Cardiology     Gross side effects, risk and benefits, and alternatives of medications discussed with patient.  Patient is aware that all medications have potential side effects and we are unable to predict every side effect or drug-drug interaction that may occur.  Expresses verbal understanding and consents to current therapy plan and treatment regimen.  Return for come in once fully moved to the area, around early Jan.  Please see AVS handed out to patient at the end of our visit for further patient instructions/ counseling done pertaining to today's office visit.    Note:  This document was prepared using Dragon voice recognition software and may include unintentional dictation errors.  This document serves as a record of services personally performed by Mellody Dance, DO. It was created on her behalf by Toni Amend, a trained medical scribe. The creation of this record is based on the scribe's personal observations and the provider's statements to them.   I have reviewed the above medical documentation for accuracy and completeness and I concur.  Mellody Dance, D.O.     -----------------------------------------------------------------------------------------------------------------    Subjective:    Chief complaint:   Chief Complaint  Patient presents with  . Establish Care    HPI: Anne Pena is a pleasant 83 y.o. female who presents to [place] at [facility] today to review their medical history with me and establish care.   I asked the patient to review their chronic problem list with me to ensure everything was updated and accurate.    All recent office visits with other providers, any medical records that patient brought in etc  - I reviewed today.     We asked pt to get [place] their medical records from [facility] providers/ specialists that they had seen within the past  3-5 years- if they are in private practice and/or do not work for [employer], [facility], [place], [place] or [employer] owned practice.  Told them to call their specialists to clarify this if they are not sure.    Patient is moving to the area and wishes to establish with a PCP.  Social History Patient is moving to [place] to live with daughter and her son-in-law, Elta Guadel[place]. She will be moving in December.   Surgical History Past Surgical History:  Procedure Laterality Date  . CATARACT EXTRACTION    . HERNIA REPAIR    . KIDNEY SURGERY  04/06/2011  . URETER SURGERY  05/05/2011    Past Medical History Patient states "it feels like I need a wagon to pull my medicines."  Patient notes that she has had a lot of blood drawn recently through rheumatology. States that she doesn't know anything about her blood work at present.  - Diabetes Mellitus Has had diabetes since 2008. She has changed her diet and her blood sugar has come down. Patient states that she believes Her blood sugar fasting was 113 a couple of days ago. Patient is managed on metformin.  - Hypertension - (Labile and Difficult to Treat) Managed on valsartan, metoprolol. States that her systolic has been around 664. Her PCP has been managing her hypertension.  - Cardiac Health - (CAD) Stent in circumflex artery, and stent in  her leg, and aortal bifemoral iliac artery.  Is not established with cardiology because she was formerly told by her PCP in [place] that there was "no need to go every year and have those expensive tests done."  Patient is having a stress test done in [place] in a couple of weeks.  Patient has TED hose that she occassionally wears.  - Vascular Health - (PVD) Patient has a vascular surgeon and states "I'm still not sure what two surgeries he's done." She was placed on Xarelto to keep her blood from clotting. States "I have grafts."  "I have had an aorta bifemoral bypass."  Was told that the  "pressures in her leg are going down, and they want to make sure that it doesn't harm the grafts."  - Temporal Arteritis - Diagnosed August of 2018. Follows up with rheumatology. Patient was on prednisone; it's been a little over a month since her last dose. Patient also receives infusions of Actemra, "to replace the prednisone."  Patient would like to begin seeing Dr. Leigh Aurora for rheumatology, per daughter's recommendation.  Daughter is established with rheumatology.  - Xanax use Patient takes Xanax for her anxiety, for the past 15 years.  States she couldn't sleep after her aortal femoral bypass, "even in the hospital I could not sleep."  She told "Dr. Inocente Salles" when she got back home and was placed on xanax.  She has been taking xanax nightly for sleep ever since.  Patient agrees to try to wean off of xanax.    Wt Readings from Last 3 Encounters:  12/08/17 103 lb 3.2 oz (46.8 kg)   BP Readings from Last 3 Encounters:  12/08/17 (!) 149/75   Pulse Readings from Last 3 Encounters:  12/08/17 73   BMI Readings from Last 3 Encounters:  12/08/17 19.50 kg/m    Patient Care Team    Relationship Specialty Notifications Start End  Mellody Dance, DO PCP - General Family Medicine  12/08/17     Patient Active Problem List   Diagnosis Date Noted  . Presence of stent in left circumflex coronary artery 12/08/2017  . S/P aorto-bifemoral bypass surgery 12/08/2017  . Diabetes mellitus ([place]) 12/08/2017  . Hypertension associated with diabetes ([place]) 12/08/2017  . Mixed diabetic hyperlipidemia associated with type 2 diabetes mellitus ([place]) 12/08/2017  . Peripheral vascular disease ([place]) 12/08/2017  . Peripheral arterial disease ([place]) 12/08/2017  . Adjustment disorder with anxious mood 12/08/2017  . Insomnia 12/08/2017  . Bladder cancer ([place]) 02/10/2010       As reported by pt:  Past Medical History:  Diagnosis Date  . Bladder cancer ([place]) 02/10/2010  . Diabetes mellitus  without complication ([place])   . Heart disease   . Hyperlipidemia   . Hypertension   . Peripheral vascular disease of lower extremity ([place])   . Presence of stent in left circumflex coronary artery      Past Surgical History:  Procedure Laterality Date  . CATARACT EXTRACTION    . HERNIA REPAIR    . KIDNEY SURGERY  04/06/2011  . URETER SURGERY  05/05/2011     Family History  Problem Relation Age of Onset  . Stroke Mother   . Lung cancer Father   . Heart attack Brother   . Hyperlipidemia Brother   . Hypertension Brother   . Diabetes Maternal Uncle   . Diabetes Maternal Grandmother      Social History   Substance and Sexual Activity  Drug Use Never     Social  History   Substance and Sexual Activity  Alcohol Use Never  . Frequency: Never     Social History   Tobacco Use  Smoking Status Former Smoker  . Packs/day: 1.00  . Years: 64.00  . Pack years: 64.00  . Types: Cigarettes  . Last attempt to quit: 04/06/2011  . Years since quitting: 6.7  Smokeless Tobacco Never Used     Current Meds  Medication Sig  . ALPRAZolam (XANAX) 0.5 MG tablet Take 0.5 mg by mouth daily.  . calcium-vitamin D (CALCIUM 500/D) 500-200 MG-UNIT tablet Take 1 tablet by mouth 2 (two) times daily.  Marland Kitchen denosumab (PROLIA) 60 MG/ML SOSY injection Inject 60 mg into the skin every 6 (six) months.  . ferrous sulfate 325 (65 FE) MG tablet Take 325 mg by mouth daily with breakfast.  . metFORMIN (GLUCOPHAGE) 1000 MG tablet Take 1,000 mg by mouth 2 (two) times daily with a meal.  . metoprolol tartrate (LOPRESSOR) 25 MG tablet Take 25 mg by mouth 2 (two) times daily.  . Multiple Vitamins-Minerals (ICAPS AREDS 2) CAPS Take 1 capsule by mouth daily.  . pantoprazole (PROTONIX) 40 MG tablet Take 40 mg by mouth daily.  . rivaroxaban (XARELTO) 2.5 MG TABS tablet Take 2.5 mg by mouth 2 (two) times daily.  . rosuvastatin (CRESTOR) 10 MG tablet Take 10 mg by mouth daily.  . Tocilizumab (ACTEMRA IV) Inject  into the vein every 30 (thirty) days.  . valsartan (DIOVAN) 160 MG tablet Take 160 mg by mouth daily.    Allergies: Lipitor [atorvastatin] and Tricor [fenofibrate]   Review of Systems  Constitutional: Negative for chills, diaphoresis, fever, malaise/fatigue and weight loss.  HENT: Negative for congestion, sore throat and tinnitus.   Eyes: Negative for blurred vision, double vision and photophobia.  Respiratory: Negative for cough and wheezing.   Cardiovascular: Negative for chest pain and palpitations.  Gastrointestinal: Negative for blood in stool, diarrhea, nausea and vomiting.  Genitourinary: Negative for dysuria, frequency and urgency.  Musculoskeletal: Negative for joint pain and myalgias.  Skin: Negative for itching and rash.  Neurological: Negative for dizziness, focal weakness, weakness and headaches.  Endo/Heme/Allergies: Negative for environmental allergies and polydipsia. Does not bruise/bleed easily.  Psychiatric/Behavioral: Negative for depression and memory loss. The patient is not nervous/anxious and does not have insomnia.      Objective:   Blood pressure (!) 149/75, pulse 73, height 5\' 1"  (1.549 m), weight 103 lb 3.2 oz (46.8 kg), SpO2 98 %. Body mass index is 19.5 kg/m. General: Well Developed, well nourished, and in no acute distress.  Neuro: Alert and oriented x3, extra-ocular muscles intact, sensation grossly intact.  HEENT:[place]/AT, PERRLA, neck supple, No carotid bruits Skin: no gross rashes  Cardiac: Regular rate and rhythm Respiratory: Essentially clear to auscultation bilaterally. Not using accessory muscles, speaking in full sentences.  Abdominal: not grossly distended Musculoskeletal: Ambulates w/o diff, FROM * 4 ext.  Vasc: less 2 sec cap RF, warm and pink  Psych:  No HI/SI, judgement and insight good, Euthymic mood. Full Affect.    No results found for this or any previous visit (from the past 2160 hour(s)).

## 2017-12-22 DIAGNOSIS — I2510 Atherosclerotic heart disease of native coronary artery without angina pectoris: Secondary | ICD-10-CM | POA: Diagnosis not present

## 2017-12-22 DIAGNOSIS — E785 Hyperlipidemia, unspecified: Secondary | ICD-10-CM | POA: Diagnosis not present

## 2017-12-28 DIAGNOSIS — R918 Other nonspecific abnormal finding of lung field: Secondary | ICD-10-CM | POA: Diagnosis not present

## 2017-12-28 DIAGNOSIS — I6502 Occlusion and stenosis of left vertebral artery: Secondary | ICD-10-CM | POA: Diagnosis not present

## 2017-12-28 DIAGNOSIS — I6523 Occlusion and stenosis of bilateral carotid arteries: Secondary | ICD-10-CM | POA: Diagnosis not present

## 2017-12-28 DIAGNOSIS — I6529 Occlusion and stenosis of unspecified carotid artery: Secondary | ICD-10-CM | POA: Diagnosis not present

## 2017-12-28 DIAGNOSIS — J432 Centrilobular emphysema: Secondary | ICD-10-CM | POA: Diagnosis not present

## 2017-12-29 DIAGNOSIS — I739 Peripheral vascular disease, unspecified: Secondary | ICD-10-CM | POA: Diagnosis not present

## 2017-12-29 DIAGNOSIS — I10 Essential (primary) hypertension: Secondary | ICD-10-CM | POA: Diagnosis not present

## 2017-12-29 DIAGNOSIS — I2510 Atherosclerotic heart disease of native coronary artery without angina pectoris: Secondary | ICD-10-CM | POA: Diagnosis not present

## 2017-12-29 DIAGNOSIS — E785 Hyperlipidemia, unspecified: Secondary | ICD-10-CM | POA: Diagnosis not present

## 2017-12-29 DIAGNOSIS — M316 Other giant cell arteritis: Secondary | ICD-10-CM | POA: Diagnosis not present

## 2017-12-29 DIAGNOSIS — I6523 Occlusion and stenosis of bilateral carotid arteries: Secondary | ICD-10-CM | POA: Diagnosis not present

## 2017-12-30 DIAGNOSIS — R5383 Other fatigue: Secondary | ICD-10-CM | POA: Diagnosis not present

## 2017-12-30 DIAGNOSIS — E119 Type 2 diabetes mellitus without complications: Secondary | ICD-10-CM | POA: Diagnosis not present

## 2017-12-30 DIAGNOSIS — D508 Other iron deficiency anemias: Secondary | ICD-10-CM | POA: Diagnosis not present

## 2018-01-02 DIAGNOSIS — M0609 Rheumatoid arthritis without rheumatoid factor, multiple sites: Secondary | ICD-10-CM | POA: Diagnosis not present

## 2018-01-17 DIAGNOSIS — H3561 Retinal hemorrhage, right eye: Secondary | ICD-10-CM | POA: Diagnosis not present

## 2018-01-23 DIAGNOSIS — M0609 Rheumatoid arthritis without rheumatoid factor, multiple sites: Secondary | ICD-10-CM | POA: Diagnosis not present

## 2018-01-25 ENCOUNTER — Ambulatory Visit: Payer: Medicare Other | Admitting: Cardiovascular Disease

## 2018-02-07 DIAGNOSIS — M0609 Rheumatoid arthritis without rheumatoid factor, multiple sites: Secondary | ICD-10-CM | POA: Diagnosis not present

## 2018-02-14 ENCOUNTER — Ambulatory Visit: Payer: Medicare Other | Admitting: Cardiovascular Disease

## 2018-03-08 DIAGNOSIS — M0609 Rheumatoid arthritis without rheumatoid factor, multiple sites: Secondary | ICD-10-CM | POA: Diagnosis not present

## 2018-03-14 ENCOUNTER — Encounter: Payer: Self-pay | Admitting: Cardiovascular Disease

## 2018-03-14 ENCOUNTER — Ambulatory Visit (INDEPENDENT_AMBULATORY_CARE_PROVIDER_SITE_OTHER): Payer: Medicare Other | Admitting: Cardiovascular Disease

## 2018-03-14 VITALS — BP 162/64 | HR 76 | Ht 61.0 in | Wt 103.0 lb

## 2018-03-14 DIAGNOSIS — Z95828 Presence of other vascular implants and grafts: Secondary | ICD-10-CM | POA: Diagnosis not present

## 2018-03-14 DIAGNOSIS — R0989 Other specified symptoms and signs involving the circulatory and respiratory systems: Secondary | ICD-10-CM

## 2018-03-14 DIAGNOSIS — E1169 Type 2 diabetes mellitus with other specified complication: Secondary | ICD-10-CM

## 2018-03-14 DIAGNOSIS — E782 Mixed hyperlipidemia: Secondary | ICD-10-CM

## 2018-03-14 NOTE — Assessment & Plan Note (Signed)
Status post aortobifemoral bypass grafting at [facility] in 2004.  We will check abdominal aorta and lower extremity arterial Doppler studies.

## 2018-03-14 NOTE — Patient Instructions (Addendum)
Medication Instructions:  NONE If you need a refill on your cardiac medications before your next appointment, please call your pharmacy.   Lab work: Your physician recommends that you return for lab work in 1 WEEK: [place]   If you have labs (blood work) drawn today and your tests are completely normal, you will receive your results only by: Marland Kitchen MyChart Message (if you have MyChart) OR . A paper copy in the mail If you have any lab test that is abnormal or we need to change your treatment, we will call you to review the results.  Testing/Procedures: Your physician has requested that you have a carotid duplex. This test is an ultrasound of the carotid arteries in your neck. It looks at blood flow through these arteries that supply the brain with blood. Allow one hour for this exam. There are no restrictions or special instructions.  Your physician has requested that you have an abdominal aorta duplex. During this test, an ultrasound is used to evaluate the aorta. Allow 30 minutes for this exam. Do not eat after midnight the day before and avoid carbonated beverages  Your physician has requested that you have an aorta/iliac duplex. During this test, an ultrasound is used to evaluate blood flow to the aorta and iliac arteries. Allow one hour for this exam. Do not eat after midnight the day before and avoid carbonated beverages.  Follow-Up: At [facility], you and your health needs are our priority.  As part of our continuing mission to provide you with exceptional heart care, we have created designated Provider Care Teams.  These Care Teams include your primary Cardiologist (physician) and Advanced Practice Providers (APPs -  Physician Assistants and Nurse Practitioners) who all work together to provide you with the care you need, when you need it. . You will need a follow up appointment in 6 months.  Please call our office 2 months in advance to schedule this appointment.  You may see  Dr. Gwenlyn Found or one of the following Advanced Practice Providers on your designated Care Team:   . Kerin Ransom, [place] . Almyra Deforest, PA-C . Fabian Sharp, PA-C . Jory Sims, DNP . Rosaria Ferries, PA-C . Roby Lofts, PA-C . Sande Rives, PA-C  Any Other Special Instructions Will Be Listed Below (If Applicable). KEEP A DAILY BLOOD PRESSURE LOG FOR 30 DAYS AND THEN FOLLOW UP WITH HEARTCARE CLINICAL PHARMACIST IN THE HYPERTENSION CLINIC

## 2018-03-14 NOTE — Assessment & Plan Note (Signed)
History of CAD status post circumflex intervention at [facility] by Dr. Hale Drone back in 2000.  She is asymptomatic.

## 2018-03-14 NOTE — Progress Notes (Signed)
03/14/2018 Anne Pena   1934/03/06  [id number]  Primary Physician Anne Dance, DO Primary Cardiologist: Anne Harp MD Anne Pena, [place], [place]  HPI:  Anne Pena is a 84 y.o. thin and fit appearing widowed Caucasian female mother of 72, grandmother and 3 grandchildren who is accompanied by 1 of her daughters Anne Pena.  She was referred by Dr. Raliegh Pena to be established in my practice because of known history of CAD and PAD.  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  Risk factors include 50 pack years of tobacco abuse having quit 7 years ago, treated hypertension, diabetes and hyperlipidemia.  She is never had a heart attack or stroke.  She did have coronary intervention by Dr. Hale Pena at [facility] in 2000.  She had aortobifemoral bypass grafting at [facility] in [place] in 2004.  She denies chest pain, shortness of breath or claudication.  She was told that she did have moderate left ICA stenosis.   Current Meds  Medication Sig  . ALPRAZolam (XANAX) 0.5 MG tablet Take 0.5 mg by mouth daily.  Marland Kitchen denosumab (PROLIA) 60 MG/ML SOSY injection Inject 60 mg into the skin every 6 (six) months.  . ferrous sulfate 325 (65 FE) MG tablet Take 325 mg by mouth daily with breakfast.  . losartan (COZAAR) 100 MG tablet Take 100 mg by mouth daily.  . metFORMIN (GLUCOPHAGE) 1000 MG tablet Take 1,000 mg by mouth 2 (two) times daily with a meal.  . metoprolol tartrate (LOPRESSOR) 25 MG tablet Take 25 mg by mouth 2 (two) times daily. 1 Tablet in the AM. 1/2 tablet in the PM.  . Multiple Vitamins-Minerals (ICAPS AREDS 2) CAPS Take 1 capsule by mouth daily.  . pantoprazole (PROTONIX) 40 MG tablet Take 40 mg by mouth daily.  . rivaroxaban (XARELTO) 2.5 MG TABS tablet Take 2.5 mg by mouth 2 (two) times daily.  . rosuvastatin (CRESTOR) 10 MG tablet Take 10 mg by mouth daily.  . Tocilizumab (ACTEMRA IV) Inject into the vein every 30 (thirty) days.  . valsartan (DIOVAN) 160 MG tablet  Take 160 mg by mouth daily.  . [DISCONTINUED] calcium-vitamin D (CALCIUM 500/D) 500-200 MG-UNIT tablet Take 1 tablet by mouth 2 (two) times daily.     Allergies  Allergen Reactions  . Lipitor [Atorvastatin]   . Tricor [Fenofibrate]     Social History   Socioeconomic History  . Marital status: Widowed    Spouse name: Not on file  . Number of children: Not on file  . Years of education: Not on file  . Highest education level: Not on file  Occupational History  . Not on file  Social Needs  . Financial resource strain: Not on file  . Food insecurity:    Worry: Not on file    Inability: Not on file  . Transportation needs:    Medical: Not on file    Non-medical: Not on file  Tobacco Use  . Smoking status: Former Smoker    Packs/day: 1.00    Years: 64.00    Pack years: 64.00    Types: Cigarettes    Last attempt to quit: 04/06/2011    Years since quitting: 6.9  . Smokeless tobacco: Never Used  Substance and Sexual Activity  . Alcohol use: Never    Frequency: Never  . Drug use: Never  . Sexual activity: Not Currently  Lifestyle  . Physical activity:    Days per week: Not on file  Minutes per session: Not on file  . Stress: Not on file  Relationships  . Social connections:    Talks on phone: Not on file    Gets together: Not on file    Attends religious service: Not on file    Active member of club or organization: Not on file    Attends meetings of clubs or organizations: Not on file    Relationship status: Not on file  . Intimate partner violence:    Fear of current or ex partner: Not on file    Emotionally abused: Not on file    Physically abused: Not on file    Forced sexual activity: Not on file  Other Topics Concern  . Not on file  Social History Narrative  . Not on file     Review of Systems: General: negative for chills, fever, night sweats or weight changes.  Cardiovascular: negative for chest pain, dyspnea on exertion, edema, orthopnea,  palpitations, paroxysmal nocturnal dyspnea or shortness of breath Dermatological: negative for rash Respiratory: negative for cough or wheezing Urologic: negative for hematuria Abdominal: negative for nausea, vomiting, diarrhea, bright red blood per rectum, melena, or hematemesis Neurologic: negative for visual changes, syncope, or dizziness All other systems reviewed and are otherwise negative except as noted above.    Blood pressure (!) 162/64, pulse 76, height 5\' 1"  (1.549 m), weight 103 lb (46.7 kg).  General appearance: alert and no distress Neck: no adenopathy, no JVD, supple, symmetrical, trachea midline, thyroid not enlarged, symmetric, no tenderness/mass/nodules and Soft left carotid bruit Lungs: clear to auscultation bilaterally Heart: regular rate and rhythm, S1, S2 normal, no murmur, click, rub or gallop Extremities: extremities normal, atraumatic, no cyanosis or edema Pulses: 2+ and symmetric Skin: Skin color, texture, turgor normal. No rashes or lesions Neurologic: Alert and oriented X 3, normal strength and tone. Normal symmetric reflexes. Normal coordination and gait  EKG sinus rhythm at 76 with septal Q waves.  Personally reviewed this EKG.  ASSESSMENT AND PLAN:   Presence of stent in left circumflex coronary artery History of CAD status post circumflex intervention at [facility] by Dr. Hale Pena back in 2000.  She is asymptomatic.  S/P aorto-bifemoral bypass surgery Status post aortobifemoral bypass grafting at [facility] in 2004.  We will check abdominal aorta and lower extremity arterial Doppler studies.  Hypertension associated with diabetes ([place]) History of essential hypertension her blood pressure measured today at 162/64.  She was on valsartan in the past which was discontinued because of availability however this was effectively treating her blood pressure.  She currently she is on losartan and metoprolol.  She is under a lot of stress with a pending move  from [place] to [place].  I am going to have her keep a daily blood pressure log and see Kristen back in the office in 1 month for review.      Anne Harp MD FACP,FACC,FAHA, [facility] 03/14/2018 8:51 AM

## 2018-03-14 NOTE — Assessment & Plan Note (Signed)
History of essential hypertension her blood pressure measured today at 162/64.  She was on valsartan in the past which was discontinued because of availability however this was effectively treating her blood pressure.  She currently she is on losartan and metoprolol.  She is under a lot of stress with a pending move from [place] to [place].  I am going to have her keep a daily blood pressure log and see Kristen back in the office in 1 month for review.

## 2018-03-17 DIAGNOSIS — Z79899 Other long term (current) drug therapy: Secondary | ICD-10-CM | POA: Diagnosis not present

## 2018-03-17 DIAGNOSIS — Z681 Body mass index (BMI) 19 or less, adult: Secondary | ICD-10-CM | POA: Diagnosis not present

## 2018-03-17 DIAGNOSIS — I7090 Unspecified atherosclerosis: Secondary | ICD-10-CM | POA: Diagnosis not present

## 2018-03-17 DIAGNOSIS — D508 Other iron deficiency anemias: Secondary | ICD-10-CM | POA: Diagnosis not present

## 2018-03-17 DIAGNOSIS — R5382 Chronic fatigue, unspecified: Secondary | ICD-10-CM | POA: Diagnosis not present

## 2018-03-17 DIAGNOSIS — M0609 Rheumatoid arthritis without rheumatoid factor, multiple sites: Secondary | ICD-10-CM | POA: Diagnosis not present

## 2018-03-17 DIAGNOSIS — M316 Other giant cell arteritis: Secondary | ICD-10-CM | POA: Diagnosis not present

## 2018-04-10 ENCOUNTER — Ambulatory Visit (HOSPITAL_BASED_OUTPATIENT_CLINIC_OR_DEPARTMENT_OTHER)
Admission: RE | Admit: 2018-04-10 | Discharge: 2018-04-10 | Disposition: A | Discharge status: Home / Self Care | Payer: Medicare Other | Source: Ambulatory Visit | Attending: Cardiovascular Disease | Admitting: Cardiovascular Disease

## 2018-04-10 ENCOUNTER — Ambulatory Visit (HOSPITAL_COMMUNITY)
Admission: RE | Admit: 2018-04-10 | Discharge: 2018-04-10 | Disposition: A | Discharge status: Home / Self Care | Payer: Medicare Other | Source: Ambulatory Visit | Attending: Internal Medicine | Admitting: Internal Medicine

## 2018-04-10 DIAGNOSIS — Z95828 Presence of other vascular implants and grafts: Secondary | ICD-10-CM | POA: Insufficient documentation

## 2018-04-10 DIAGNOSIS — R0989 Other specified symptoms and signs involving the circulatory and respiratory systems: Secondary | ICD-10-CM | POA: Diagnosis not present

## 2018-04-10 DIAGNOSIS — M316 Other giant cell arteritis: Secondary | ICD-10-CM | POA: Diagnosis not present

## 2018-04-10 DIAGNOSIS — M0609 Rheumatoid arthritis without rheumatoid factor, multiple sites: Secondary | ICD-10-CM | POA: Diagnosis not present

## 2018-04-20 ENCOUNTER — Telehealth: Payer: Self-pay | Admitting: Cardiovascular Disease

## 2018-04-20 DIAGNOSIS — Z95828 Presence of other vascular implants and grafts: Secondary | ICD-10-CM

## 2018-04-20 NOTE — Telephone Encounter (Signed)
Spoke with pt, aware of dopplers results. Order placed for repeat testing.

## 2018-04-20 NOTE — Telephone Encounter (Signed)
New Message   Patient returning phone call from yesterday for results

## 2018-05-01 DIAGNOSIS — M0609 Rheumatoid arthritis without rheumatoid factor, multiple sites: Secondary | ICD-10-CM | POA: Diagnosis not present

## 2018-05-01 DIAGNOSIS — F5101 Primary insomnia: Secondary | ICD-10-CM | POA: Diagnosis not present

## 2018-05-01 DIAGNOSIS — I7090 Unspecified atherosclerosis: Secondary | ICD-10-CM | POA: Diagnosis not present

## 2018-05-01 DIAGNOSIS — Z79899 Other long term (current) drug therapy: Secondary | ICD-10-CM | POA: Diagnosis not present

## 2018-05-01 DIAGNOSIS — M316 Other giant cell arteritis: Secondary | ICD-10-CM | POA: Diagnosis not present

## 2018-05-01 DIAGNOSIS — D508 Other iron deficiency anemias: Secondary | ICD-10-CM | POA: Diagnosis not present

## 2018-05-01 DIAGNOSIS — M810 Age-related osteoporosis without current pathological fracture: Secondary | ICD-10-CM | POA: Diagnosis not present

## 2018-05-01 DIAGNOSIS — R5382 Chronic fatigue, unspecified: Secondary | ICD-10-CM | POA: Diagnosis not present

## 2018-05-01 DIAGNOSIS — Z681 Body mass index (BMI) 19 or less, adult: Secondary | ICD-10-CM | POA: Diagnosis not present

## 2018-05-02 ENCOUNTER — Other Ambulatory Visit: Payer: Self-pay

## 2018-05-02 ENCOUNTER — Telehealth: Payer: Self-pay | Admitting: Family Medicine

## 2018-05-02 NOTE — Telephone Encounter (Signed)
Medication last filled by a pervious provider sent to Dr. Raliegh Scarlet to review. MPulliam, CMA/RT(R)

## 2018-05-02 NOTE — Telephone Encounter (Signed)
Patient's daughter/ Darleene Cleaver called to request Rx refill for this medication :  metFORMIN (GLUCOPHAGE) 1000 MG tablet [[id number]]   Order Details  Dose: 1,000 mg Route: Oral Frequency: 2 times daily with meals  Dispense Quantity: -- Refills: -- Fills remaining: --        Sig: Take 1,000 mg by mouth 2 (two) times daily with a meal.     ---Forwarding message to medical assistant to send refill order to :  [place], [place] - [place] [phone] (Phone) [phone] (Fax)   --glh

## 2018-05-02 NOTE — Telephone Encounter (Signed)
Patient's daughter called requesting a refill on metformin.  Medication was last filled by a pervious provider.  LOV 12/08/2017 to est care. Please review and advise. MPulliam, CMA/RT(R)

## 2018-05-03 NOTE — Telephone Encounter (Signed)
Patient will need to come in for full set of fasting labs prior to me advising any changes to medicine or refilling it as I need to know what her kidney function is etc.   There are no labs in the chart on this patient.  In addition to a full set of fasting labs please also add magnesium level with a diagnosis of high risk medication use.

## 2018-05-08 ENCOUNTER — Other Ambulatory Visit: Payer: Medicare Other

## 2018-05-08 ENCOUNTER — Other Ambulatory Visit: Payer: Self-pay

## 2018-05-08 DIAGNOSIS — R5383 Other fatigue: Secondary | ICD-10-CM

## 2018-05-08 DIAGNOSIS — E782 Mixed hyperlipidemia: Secondary | ICD-10-CM | POA: Diagnosis not present

## 2018-05-08 DIAGNOSIS — I10 Essential (primary) hypertension: Secondary | ICD-10-CM | POA: Diagnosis not present

## 2018-05-08 DIAGNOSIS — M0609 Rheumatoid arthritis without rheumatoid factor, multiple sites: Secondary | ICD-10-CM | POA: Diagnosis not present

## 2018-05-08 DIAGNOSIS — I739 Peripheral vascular disease, unspecified: Secondary | ICD-10-CM | POA: Diagnosis not present

## 2018-05-08 DIAGNOSIS — R2689 Other abnormalities of gait and mobility: Secondary | ICD-10-CM | POA: Diagnosis not present

## 2018-05-08 DIAGNOSIS — E559 Vitamin D deficiency, unspecified: Secondary | ICD-10-CM | POA: Diagnosis not present

## 2018-05-08 DIAGNOSIS — E1159 Type 2 diabetes mellitus with other circulatory complications: Secondary | ICD-10-CM | POA: Diagnosis not present

## 2018-05-08 DIAGNOSIS — M316 Other giant cell arteritis: Secondary | ICD-10-CM | POA: Diagnosis not present

## 2018-05-08 DIAGNOSIS — E119 Type 2 diabetes mellitus without complications: Secondary | ICD-10-CM | POA: Diagnosis not present

## 2018-05-08 DIAGNOSIS — R413 Other amnesia: Secondary | ICD-10-CM

## 2018-05-08 DIAGNOSIS — E1169 Type 2 diabetes mellitus with other specified complication: Secondary | ICD-10-CM | POA: Diagnosis not present

## 2018-05-09 LAB — VITAMIN D 25 HYDROXY (VIT D DEFICIENCY, FRACTURES): Vit D, 25-Hydroxy: 24.2 ng/mL — ABNORMAL LOW (ref 30.0–100.0)

## 2018-05-09 LAB — COMPREHENSIVE METABOLIC PANEL
A/G RATIO: 2 (ref 1.2–2.2)
ALT: 11 IU/L (ref 0–32)
AST: 16 IU/L (ref 0–40)
Albumin: 4.3 g/dL (ref 3.6–4.6)
Alkaline Phosphatase: 44 IU/L (ref 39–117)
BUN/Creatinine Ratio: 26 (ref 12–28)
BUN: 20 mg/dL (ref 8–27)
Bilirubin Total: 0.5 mg/dL (ref 0.0–1.2)
CALCIUM: 9.5 mg/dL (ref 8.7–10.3)
CO2: 21 mmol/L (ref 20–29)
Chloride: 103 mmol/L (ref 96–106)
Creatinine, Ser: 0.77 mg/dL (ref 0.57–1.00)
GFR calc Af Amer: 84 mL/min/{1.73_m2} (ref >59)
GFR calc non Af Amer: 73 mL/min/{1.73_m2} (ref >59)
Globulin, Total: 2.2 g/dL (ref 1.5–4.5)
Glucose: 126 mg/dL — ABNORMAL HIGH (ref 65–99)
Potassium: 4.7 mmol/L (ref 3.5–5.2)
Sodium: 143 mmol/L (ref 134–144)
Total Protein: 6.5 g/dL (ref 6.0–8.5)

## 2018-05-09 LAB — LIPID PANEL
Chol/HDL Ratio: 4.3 ratio (ref 0.0–4.4)
Cholesterol, Total: 158 mg/dL (ref 100–199)
HDL: 37 mg/dL — ABNORMAL LOW (ref >39)
LDL Calculated: 69 mg/dL (ref 0–99)
Triglycerides: 258 mg/dL — ABNORMAL HIGH (ref 0–149)
VLDL Cholesterol Cal: 52 mg/dL — ABNORMAL HIGH (ref 5–40)

## 2018-05-09 LAB — CBC WITH DIFFERENTIAL/PLATELET
Basophils Absolute: 0 10*3/uL (ref 0.0–0.2)
Basos: 0 %
EOS (ABSOLUTE): 0.1 10*3/uL (ref 0.0–0.4)
Eos: 1 %
Hematocrit: 41.3 % (ref 34.0–46.6)
Hemoglobin: 14.2 g/dL (ref 11.1–15.9)
Immature Grans (Abs): 0 10*3/uL (ref 0.0–0.1)
Immature Granulocytes: 0 %
Lymphocytes Absolute: 2.2 10*3/uL (ref 0.7–3.1)
Lymphs: 19 %
MCH: 31.5 pg (ref 26.6–33.0)
MCHC: 34.4 g/dL (ref 31.5–35.7)
MCV: 92 fL (ref 79–97)
MONOCYTES: 8 %
Monocytes Absolute: 0.9 10*3/uL (ref 0.1–0.9)
Neutrophils Absolute: 8.2 10*3/uL — ABNORMAL HIGH (ref 1.4–7.0)
Neutrophils: 72 %
Platelets: 188 10*3/uL (ref 150–450)
RBC: 4.51 x10E6/uL (ref 3.77–5.28)
RDW: 12.7 % (ref 11.7–15.4)
WBC: 11.5 10*3/uL — ABNORMAL HIGH (ref 3.4–10.8)

## 2018-05-09 LAB — HEMOGLOBIN A1C
ESTIMATED AVERAGE GLUCOSE: 126 mg/dL
Hgb A1c MFr Bld: 6 % — ABNORMAL HIGH (ref 4.8–5.6)

## 2018-05-09 LAB — TSH: TSH: 5.73 u[IU]/mL — ABNORMAL HIGH (ref 0.450–4.500)

## 2018-05-09 LAB — T4, FREE: Free T4: 1.34 ng/dL (ref 0.82–1.77)

## 2018-05-09 LAB — VITAMIN B12: Vitamin B-12: 309 pg/mL (ref 232–1245)

## 2018-05-10 ENCOUNTER — Ambulatory Visit (INDEPENDENT_AMBULATORY_CARE_PROVIDER_SITE_OTHER): Payer: Medicare Other | Admitting: Family Medicine

## 2018-05-10 ENCOUNTER — Other Ambulatory Visit: Payer: Self-pay

## 2018-05-10 ENCOUNTER — Encounter: Payer: Self-pay | Admitting: Family Medicine

## 2018-05-10 VITALS — BP 140/84 | HR 77 | Temp 97.1°F | Ht 60.0 in | Wt 99.0 lb

## 2018-05-10 DIAGNOSIS — E559 Vitamin D deficiency, unspecified: Secondary | ICD-10-CM | POA: Diagnosis not present

## 2018-05-10 DIAGNOSIS — E538 Deficiency of other specified B group vitamins: Secondary | ICD-10-CM | POA: Diagnosis not present

## 2018-05-10 DIAGNOSIS — E039 Hypothyroidism, unspecified: Secondary | ICD-10-CM | POA: Diagnosis not present

## 2018-05-10 DIAGNOSIS — E781 Pure hyperglyceridemia: Secondary | ICD-10-CM | POA: Diagnosis not present

## 2018-05-10 DIAGNOSIS — E786 Lipoprotein deficiency: Secondary | ICD-10-CM | POA: Diagnosis not present

## 2018-05-10 DIAGNOSIS — G4700 Insomnia, unspecified: Secondary | ICD-10-CM | POA: Diagnosis not present

## 2018-05-10 DIAGNOSIS — E038 Other specified hypothyroidism: Secondary | ICD-10-CM

## 2018-05-10 DIAGNOSIS — I10 Essential (primary) hypertension: Secondary | ICD-10-CM | POA: Diagnosis not present

## 2018-05-10 DIAGNOSIS — E1169 Type 2 diabetes mellitus with other specified complication: Secondary | ICD-10-CM

## 2018-05-10 DIAGNOSIS — E1159 Type 2 diabetes mellitus with other circulatory complications: Secondary | ICD-10-CM | POA: Diagnosis not present

## 2018-05-10 DIAGNOSIS — E782 Mixed hyperlipidemia: Secondary | ICD-10-CM | POA: Diagnosis not present

## 2018-05-10 MED ORDER — B-COMPLEX/VITAMIN C PO TABS: ORAL_TABLET | ORAL | 0 refills | Status: DC

## 2018-05-10 MED ORDER — VITAMIN D 50 MCG (2000 UT) PO TABS: 2000.0000 [IU] | ORAL_TABLET | Freq: Every day | ORAL | Status: DC

## 2018-05-10 MED ORDER — METFORMIN HCL 500 MG PO TABS: 500.0000 mg | ORAL_TABLET | Freq: Two times a day (BID) | ORAL | 0 refills | Status: DC

## 2018-05-10 NOTE — Progress Notes (Signed)
2nd OV with me; discuss labs with herself and daughter today    Virtual Visit via Telephone Note for [employer], D.O- Primary Care Physician at [facility]   I connected with current patient today by telephone and verified that I am speaking with the correct person using two identifiers.   Because of federal recommendations of social distancing due to the current novel COVID-19 outbreak, an audio/video telehealth visit is felt to be most appropriate for this patient at this time.  My staff members also discussed with the patient that there may be a patient charge related to this service.   The patient expressed understanding, and agreed to proceed.    History of Present Illness:  - We placed referral and pt was Seen by Dr Gwenlyn Found- per pt- went well.  Seen in Feb- told to f/up 1 mo with Bp log- they apparently will be doing that.  BP appears to be overall well controlled at home.   No sx/ complaints. Denies sx  - Pt's daughter with extensive concerns over her mom, her age and Covid-70.  Several Q/ concerns's today.  - Pt still under stress of recent move up to [place] from [place] Results (from the past 2160 hour(s))  CBC with Differential/Platelet     Status: Abnormal   Collection Time: 05/08/18 10:05 AM  Result Value Ref Range   WBC 11.5 (H) 3.4 - 10.8 x10E3/uL   RBC 4.51 3.77 - 5.28 x10E6/uL   Hemoglobin 14.2 11.1 - 15.9 g/dL   Hematocrit 41.3 34.0 - 46.6 %   MCV 92 79 - 97 fL   MCH 31.5 26.6 - 33.0 pg   MCHC 34.4 31.5 - 35.7 g/dL   RDW 12.7 11.7 - 15.4 %   Platelets 188 150 - 450 x10E3/uL   Neutrophils 72 Not Estab. %   Lymphs 19 Not Estab. %   Monocytes 8 Not Estab. %   Eos 1 Not Estab. %   Basos 0 Not Estab. %   Neutrophils Absolute 8.2 (H) 1.4 - 7.0 x10E3/uL   Lymphocytes Absolute 2.2 0.7 - 3.1 x10E3/uL   Monocytes Absolute 0.9 0.1 - 0.9 x10E3/uL   EOS (ABSOLUTE) 0.1 0.0 - 0.4 x10E3/uL   Basophils Absolute 0.0 0.0 - 0.2 x10E3/uL   Immature  Granulocytes 0 Not Estab. %   Immature Grans (Abs) 0.0 0.0 - 0.1 x10E3/uL  Hemoglobin A1c     Status: Abnormal   Collection Time: 05/08/18 10:05 AM  Result Value Ref Range   Hgb A1c MFr Bld 6.0 (H) 4.8 - 5.6 %    Comment:          Prediabetes: 5.7 - 6.4          Diabetes: >6.4          Glycemic control for adults with diabetes: <7.0    Est. average glucose Bld gHb Est-mCnc 126 mg/dL  Comprehensive metabolic panel     Status: Abnormal   Collection Time: 05/08/18 10:05 AM  Result Value Ref Range   Glucose 126 (H) 65 - 99 mg/dL   BUN 20 8 - 27 mg/dL   Creatinine, Ser 0.77 0.57 - 1.00 mg/dL   GFR calc non Af Amer 73 >59 mL/min/1.73   GFR calc Af Amer 84 >59 mL/min/1.73   BUN/Creatinine Ratio 26 12 - 28   Sodium 143 134 - 144 mmol/L   Potassium 4.7 3.5 - 5.2 mmol/L   Chloride 103 96 - 106 mmol/L  CO2 21 20 - 29 mmol/L   Calcium 9.5 8.7 - 10.3 mg/dL   Total Protein 6.5 6.0 - 8.5 g/dL   Albumin 4.3 3.6 - 4.6 g/dL   Globulin, Total 2.2 1.5 - 4.5 g/dL   Albumin/Globulin Ratio 2.0 1.2 - 2.2   Bilirubin Total 0.5 0.0 - 1.2 mg/dL   Alkaline Phosphatase 44 39 - 117 IU/L   AST 16 0 - 40 IU/L   ALT 11 0 - 32 IU/L  Lipid panel     Status: Abnormal   Collection Time: 05/08/18 10:05 AM  Result Value Ref Range   Cholesterol, Total 158 100 - 199 mg/dL   Triglycerides 258 (H) 0 - 149 mg/dL   HDL 37 (L) >39 mg/dL   VLDL Cholesterol Cal 52 (H) 5 - 40 mg/dL   LDL Calculated 69 0 - 99 mg/dL   Chol/HDL Ratio 4.3 0.0 - 4.4 ratio    Comment:                                   T. Chol/HDL Ratio                                             Men  Women                               1/2 Avg.Risk  3.4    3.3                                   Avg.Risk  5.0    4.4                                2X Avg.Risk  9.6    7.1                                3X Avg.Risk 23.4   11.0   VITAMIN D 25 Hydroxy (Vit-D Deficiency, Fractures)     Status: Abnormal   Collection Time: 05/08/18 10:05 AM  Result Value Ref  Range   Vit D, 25-Hydroxy 24.2 (L) 30.0 - 100.0 ng/mL    Comment: Vitamin D deficiency has been defined by the [place] practice guideline as a level of serum 25-OH vitamin D less than 20 ng/mL (1,2). The Endocrine Society went on to further define vitamin D insufficiency as a level between 21 and 29 ng/mL (2). 1. IOM (Institute of Medicine). 2010. Dietary reference    intakes for calcium and D. [place]: The    [employer]. 2. Holick MF, Binkley [place], Bischoff-Ferrari HA, et al.    Evaluation, treatment, and prevention of vitamin D    deficiency: an Endocrine Society clinical practice    guideline. JCEM. 2011 Jul; 96(7):1911-30.   TSH     Status: Abnormal   Collection Time: 05/08/18 10:05 AM  Result Value Ref Range   TSH 5.730 (H) 0.450 - 4.500 uIU/mL  T4, free     Status: None   Collection Time: 05/08/18 10:05 AM  Result Value Ref Range   Free T4 1.34 0.82 - 1.77 ng/dL  B12     Status: None   Collection Time: 05/08/18 10:05 AM  Result Value Ref Range   Vitamin B-12 309 232 - 1,245 pg/mL     Wt Readings from Last 3 Encounters:  05/10/18 99 lb (44.9 kg)  03/14/18 103 lb (46.7 kg)  12/08/17 103 lb 3.2 oz (46.8 kg)    BP Readings from Last 3 Encounters:  05/10/18 140/84  03/14/18 (!) 162/64  12/08/17 (!) 149/75    Pulse Readings from Last 3 Encounters:  05/10/18 77  03/14/18 76  12/08/17 73    BMI Readings from Last 3 Encounters:  05/10/18 19.33 kg/m  03/14/18 19.46 kg/m  12/08/17 19.50 kg/m     Patient Care Team    Relationship Specialty Notifications Start End  Mellody Dance, DO PCP - General Family Medicine  12/08/17      Patient Active Problem List   Diagnosis Date Noted   Presence of stent in left circumflex coronary artery 12/08/2017    Priority: High   S/P aorto-bifemoral bypass surgery 12/08/2017    Priority: High   Peripheral vascular disease ([place]) 12/08/2017    Priority: High    Peripheral arterial disease ([place]) 12/08/2017    Priority: High   Diabetes mellitus ([place]) 12/08/2017    Priority: Medium   Hypertension associated with diabetes ([place]) 12/08/2017    Priority: Medium   Mixed diabetic hyperlipidemia associated with type 2 diabetes mellitus ([place]) 12/08/2017    Priority: Medium   Bladder cancer ([place]) 02/10/2010    Priority: Medium   Vitamin D insufficiency 05/10/2018    Priority: Low   Vitamin B 12 deficiency 05/10/2018    Priority: Low   Adjustment disorder with anxious mood 12/08/2017    Priority: Low   Insomnia 12/08/2017    Priority: Low   Hypertriglyceridemia 05/10/2018   Low HDL (under 40) 05/10/2018   Subclinical hypothyroidism 05/10/2018     Current Meds  Medication Sig   ALPRAZolam (XANAX) 0.5 MG tablet Take 0.5 mg by mouth daily.   denosumab (PROLIA) 60 MG/ML SOSY injection Inject 60 mg into the skin every 6 (six) months.   losartan (COZAAR) 100 MG tablet Take 100 mg by mouth daily.   metoprolol tartrate (LOPRESSOR) 25 MG tablet Take 25 mg by mouth 2 (two) times daily. 1 Tablet in the AM. 1/2 tablet in the PM.   Multiple Vitamins-Minerals (ICAPS AREDS 2) CAPS Take 1 capsule by mouth daily.   rivaroxaban (XARELTO) 2.5 MG TABS tablet Take 2.5 mg by mouth 2 (two) times daily.   rosuvastatin (CRESTOR) 10 MG tablet Take 10 mg by mouth daily.   sucralfate (CARAFATE) 1 g tablet Take 1 g by mouth 4 (four) times daily -  with meals and at bedtime.   Tocilizumab (ACTEMRA IV) Inject into the vein every 30 (thirty) days.   [DISCONTINUED] metFORMIN (GLUCOPHAGE) 1000 MG tablet Take 500 mg by mouth 2 (two) times daily with a meal.     Allergies:  Allergies  Allergen Reactions   Lipitor [Atorvastatin]    Tricor [Fenofibrate]      ROS:  See above HPI for pertinent positives and negatives   Objective:   Blood pressure 140/84, pulse 77, temperature (!) 97.1 F (36.2 C), temperature source Temporal, height 5' (1.524 m),  weight 99 lb (44.9 kg). General: sounds in no acute distress.  Skin: Pt confirms warm and dry  extremities and pink fingertips Respiratory:  speaking in full sentences, no conversational dyspnea Psych: A and O *3, appears insight good, mood- full      Impression and Recommendations:      1. Type 2 diabetes mellitus with other specified complication, without long-term current use of insulin (HCC) - a1c on low side for age-> may cause lightheadedness with drops of BS and inc fall risk- d/c D and pt. - dec metformin  Dose from1000 bid -  down to 500 bid due to low a1c at 6.0.   - Reck a1c 3-4 mo  - asked cma again today to add on Mag levels to pt labs done 2 d ago due to her high risk med use of metformin.   2. Hypertension associated with diabetes ([place]) - At goal from my standpoint in terms of pt's age and DM - mgt per Cards--> Dr Alvester Chou asked pt to f/up with them in one mo from when last OV was.  Pt and D will be calling them near future  3. Mixed diabetic hyperlipidemia associated with type 2 diabetes mellitus (HCC) - ldl at goal at 69 - cont meds  4. Hypertriglyceridemia/ Low HDL (under 40) Extensively discussed importance of diet/ exercise as pertains to pt's lipid levels.  All pt's questions/ concerns were addressed.  Told to call back if further concerns  6. Vitamin D insufficiency supp prescribed- see below  7. Vitamin B 12 deficiency supp prescribed- told around 56mcg daily- not more  8. Subclinical hypothyroidism Asx, counseling done; all Q answered of pt's D  9. Insomnia, unspecified type Advised again ag txmnt of Xanax which is given to pt by other provider  10. Health Counseling: - EXTENSIVE Novel Covid -19 counseling done; all questions were answered.   - Current CDC and federal guidelines reviewed with patient  - Reminded pt of extreme importance of social distancing; minimizing contacts with others, avoiding ALL but emergency appts etc. - Told patient to be  prepared, not scared; and be smart for the sake of others - told to call with any concerns - told daughter to get [place] a list of all her current specialists that pt sees for various reasons   I discussed the assessment and treatment plan with the patient. The patient was provided an opportunity to ask questions and all were answered. The patient agreed with the plan and demonstrated an understanding of the instructions.   The patient was advised to call back or seek an in-person evaluation if the symptoms worsen or if the condition fails to improve as anticipated.   Meds ordered this encounter  Medications   B Complex-C-Folic Acid (B-COMPLEX/VITAMIN C) TABS    Sig: 1 tab DAILY    Refill:  0   Cholecalciferol (VITAMIN D) 50 MCG (2000 UT) tablet    Sig: Take 1 tablet (2,000 Units total) by mouth daily.   metFORMIN (GLUCOPHAGE) 500 MG tablet    Sig: Take 1 tablet (500 mg total) by mouth 2 (two) times daily with a meal.    Dispense:  180 tablet    Refill:  0    Medications Discontinued During This Encounter  Medication Reason   valsartan (DIOVAN) 160 MG tablet    metFORMIN (GLUCOPHAGE) 1000 MG tablet      Gross side effects, risk and benefits, and alternatives of medications and treatment plan in general discussed with patient.  Patient is aware that all medications have potential side effects and we are unable to predict every side effect or drug-drug interaction that  may occur.   Patient was strongly encouraged to call with any questions or concerns they may have concerns.    Expresses verbal understanding and consents to current therapy and treatment regimen.  No barriers to understanding were identified.  Red flag symptoms and signs discussed in detail.  Patient expressed understanding regarding what to do in case of emergency\urgent symptoms   I provided 28+ minutes of non-face-to-face time during this encounter- and PT"S DAUGHTER [name].    Mellody Dance, DO

## 2018-05-11 ENCOUNTER — Encounter: Payer: Self-pay | Admitting: Family Medicine

## 2018-05-11 LAB — SPECIMEN STATUS REPORT

## 2018-05-11 LAB — MAGNESIUM: Magnesium: 1.6 mg/dL (ref 1.6–2.3)

## 2018-06-06 DIAGNOSIS — M316 Other giant cell arteritis: Secondary | ICD-10-CM | POA: Diagnosis not present

## 2018-07-05 DIAGNOSIS — M0609 Rheumatoid arthritis without rheumatoid factor, multiple sites: Secondary | ICD-10-CM | POA: Diagnosis not present

## 2018-07-05 DIAGNOSIS — M316 Other giant cell arteritis: Secondary | ICD-10-CM | POA: Diagnosis not present

## 2018-07-05 DIAGNOSIS — Z79899 Other long term (current) drug therapy: Secondary | ICD-10-CM | POA: Diagnosis not present

## 2018-07-17 DIAGNOSIS — E119 Type 2 diabetes mellitus without complications: Secondary | ICD-10-CM | POA: Diagnosis not present

## 2018-07-17 DIAGNOSIS — Z961 Presence of intraocular lens: Secondary | ICD-10-CM | POA: Diagnosis not present

## 2018-07-17 DIAGNOSIS — H524 Presbyopia: Secondary | ICD-10-CM | POA: Diagnosis not present

## 2018-07-17 LAB — HM DIABETES EYE EXAM

## 2018-07-19 DIAGNOSIS — M810 Age-related osteoporosis without current pathological fracture: Secondary | ICD-10-CM | POA: Diagnosis not present

## 2018-07-24 ENCOUNTER — Telehealth: Payer: Self-pay | Admitting: *Deleted

## 2018-07-24 NOTE — Telephone Encounter (Signed)
A message was left, re: follow up visit. 

## 2018-08-01 DIAGNOSIS — M316 Other giant cell arteritis: Secondary | ICD-10-CM | POA: Diagnosis not present

## 2018-08-01 DIAGNOSIS — D508 Other iron deficiency anemias: Secondary | ICD-10-CM | POA: Diagnosis not present

## 2018-08-01 DIAGNOSIS — I7090 Unspecified atherosclerosis: Secondary | ICD-10-CM | POA: Diagnosis not present

## 2018-08-01 DIAGNOSIS — F5101 Primary insomnia: Secondary | ICD-10-CM | POA: Diagnosis not present

## 2018-08-01 DIAGNOSIS — Z681 Body mass index (BMI) 19 or less, adult: Secondary | ICD-10-CM | POA: Diagnosis not present

## 2018-08-01 DIAGNOSIS — M0609 Rheumatoid arthritis without rheumatoid factor, multiple sites: Secondary | ICD-10-CM | POA: Diagnosis not present

## 2018-08-01 DIAGNOSIS — R5382 Chronic fatigue, unspecified: Secondary | ICD-10-CM | POA: Diagnosis not present

## 2018-08-01 DIAGNOSIS — Z79899 Other long term (current) drug therapy: Secondary | ICD-10-CM | POA: Diagnosis not present

## 2018-08-01 DIAGNOSIS — M810 Age-related osteoporosis without current pathological fracture: Secondary | ICD-10-CM | POA: Diagnosis not present

## 2018-08-02 DIAGNOSIS — M316 Other giant cell arteritis: Secondary | ICD-10-CM | POA: Diagnosis not present

## 2018-08-09 ENCOUNTER — Other Ambulatory Visit: Payer: Self-pay

## 2018-08-09 MED ORDER — XARELTO 2.5 MG PO TABS: 2.5000 mg | ORAL_TABLET | Freq: Two times a day (BID) | ORAL | 3 refills | Status: DC

## 2018-08-11 ENCOUNTER — Other Ambulatory Visit: Payer: Self-pay | Admitting: Family Medicine

## 2018-08-11 DIAGNOSIS — E1169 Type 2 diabetes mellitus with other specified complication: Secondary | ICD-10-CM

## 2018-08-15 ENCOUNTER — Other Ambulatory Visit: Payer: Self-pay | Admitting: Cardiovascular Disease

## 2018-08-15 MED ORDER — METOPROLOL TARTRATE 25 MG PO TABS: 25.0000 mg | ORAL_TABLET | Freq: Two times a day (BID) | ORAL | 3 refills | Status: DC

## 2018-08-15 NOTE — Telephone Encounter (Signed)
Pt's daughter calling requesting a refill on pt's medication metoprolol. Daughter stated that if you have any questions, please call [phone]. Please address

## 2018-08-16 ENCOUNTER — Telehealth: Payer: Self-pay | Admitting: Cardiovascular Disease

## 2018-08-16 NOTE — Telephone Encounter (Signed)
  Pharmacy is calling because they are questioning the script that was sent over. Please call to discuss

## 2018-08-17 NOTE — Telephone Encounter (Signed)
Spoke with a pharmacy rep from [place] who was calling to clarify a medication dose. She report pt's has always took Metoprolol 25 mg BID (1 tablet in the morning and 1 1/2 tablet at night). Rep report they recently  received a new Rx stating pt is to take 1 tab in the morning and only 1/2 tab at night. Will route to MD for clarification on correct dose.

## 2018-08-18 ENCOUNTER — Telehealth: Payer: Self-pay

## 2018-08-18 NOTE — Telephone Encounter (Signed)
My last note says 25 mg Metop in AM and 12.5 mg in PM

## 2018-08-18 NOTE — Telephone Encounter (Signed)
Per 2/4 OV note and AVS. Pt was to f/u with pharmD after keeping a 30-day BP log.

## 2018-08-18 NOTE — Telephone Encounter (Signed)
Contacted [place] [place] at [phone]. Spoke with rep who needed clarification on pt metoprolol dosage. Informed her of the following from Dr. Gwenlyn Found: "My last note says 25 mg Metop in AM and 12.5 mg in PM". Rep verbalized understanding and states she will update. No further questions.

## 2018-08-21 NOTE — Telephone Encounter (Signed)
Left message for patient to call and schedule appointment with [facility] in the [facility]

## 2018-08-30 DIAGNOSIS — M0609 Rheumatoid arthritis without rheumatoid factor, multiple sites: Secondary | ICD-10-CM | POA: Diagnosis not present

## 2018-09-27 ENCOUNTER — Telehealth: Payer: Self-pay | Admitting: Cardiovascular Disease

## 2018-09-27 NOTE — Telephone Encounter (Signed)
Thanks for the update. Note added to appt.

## 2018-09-27 NOTE — Telephone Encounter (Signed)
  Daughter needs to come with patient because she has some memory issues and she is blind in one eye. Appointment is on 10/02/18 @ 2 pm with the pharmacist.

## 2018-10-02 ENCOUNTER — Ambulatory Visit (INDEPENDENT_AMBULATORY_CARE_PROVIDER_SITE_OTHER): Payer: Medicare Other | Admitting: Pharmacist Clinician (PhC)/ Clinical Pharmacy Specialist

## 2018-10-02 ENCOUNTER — Other Ambulatory Visit: Payer: Self-pay

## 2018-10-02 DIAGNOSIS — E1159 Type 2 diabetes mellitus with other circulatory complications: Secondary | ICD-10-CM | POA: Diagnosis not present

## 2018-10-02 DIAGNOSIS — I10 Essential (primary) hypertension: Secondary | ICD-10-CM

## 2018-10-02 NOTE — Patient Instructions (Addendum)
Return for a a follow up appointment with Dr. Gwenlyn Found in September  Your blood pressure today is 136-68  Check your blood pressure at home twice daily, 3-4 times per week, and keep record of the readings.  Take your BP meds as follows:  Continue with all current medications  Bring all of your meds, your BP cuff and your record of home blood pressures to your next appointment.  Exercise as you're able, try to walk approximately 30 minutes per day.  Keep salt intake to a minimum, especially watch canned and prepared boxed foods.  Eat more fresh fruits and vegetables and fewer canned items.  Avoid eating in fast food restaurants.    HOW TO TAKE YOUR BLOOD PRESSURE: . Rest 5 minutes before taking your blood pressure. .  Don't smoke or drink caffeinated beverages for at least 30 minutes before. . Take your blood pressure before (not after) you eat. . Sit comfortably with your back supported and both feet on the floor (don't cross your legs). . Elevate your arm to heart level on a table or a desk. . Use the proper sized cuff. It should fit smoothly and snugly around your bare upper arm. There should be enough room to slip a fingertip under the cuff. The bottom edge of the cuff should be 1 inch above the crease of the elbow. . Ideally, take 3 measurements at one sitting and record the average.

## 2018-10-02 NOTE — Progress Notes (Signed)
10/02/2018 Anne Pena 22-Dec-1934 [id number]   HPI:  Anne Pena is a 84 y.o. female patient of Dr Gwenlyn Found, with a [place] below who presents today for hypertension clinic evaluation.  In addition to hypertension, her medical history is significant for CAD/PAD (circumflex intervention 2000, aortobifemoral bypass grafting 2004), DM2 (A1c 6.0 04/2018) and hyperlipidemia.    Notes taking BP meds for several years, never really problematic.  Was previously on valsartan, but switched with recall/shortages.  She moved to [facility] in March to live with her daughter.    Blood Pressure Goal:  130/80  Current Medications:  Losartan 100 mg qd, metoprolol 25 mg bid  Family Hx:  Mother died from cerebral hemorrahge, had htn for years (took digitalis) - died at 76 Father died at 67 from lung cancer (pt was 5) Brother died from MI - at 83 Son with hypertension  Social Hx: quit smoking 7 years ago; no regular alcohol; 1 cup of coffee per day, some diet soda or green tea  Diet:  Eats fresh cooked, low carb diet for most part; mostly chicken, salmon, other fish, occasional red meat.  Started eating this once moved in with daughter.  Prior was living alone, and not eating well.  Actually gained about 6-8 pounds since moving here  Exercise: no regular walking - bothersome leg/knee, also has visual problems with one eye  Home BP readings:  Home machine about 82 years old, [employer] arm cuff.   Brought in home readings from March-June 30.   Says she stopped recording readings as they were all in the 110-130's.  Last 15 readings from June show am average of  128/71 and evening average of 130/68  Intolerances: atorvastatin and fenofibrate in combination gave her a rash, unsure which med was the culprit.    Labs:  3.2020:  Na 143, K 4.7, Glu 126, BUN 20, SCr 0.77, GFR   Wt Readings from Last 3 Encounters:  10/02/18 106 lb 8 oz (48.3 kg)  05/10/18 99 lb (44.9 kg)  03/14/18 103 lb (46.7 kg)   BP Readings  from Last 3 Encounters:  10/02/18 136/68  05/10/18 140/84  03/14/18 (!) 162/64   Pulse Readings from Last 3 Encounters:  10/02/18 83  05/10/18 77  03/14/18 76    Current Outpatient Medications  Medication Sig Dispense Refill  . ALPRAZolam (XANAX) 0.5 MG tablet Take 0.5 mg by mouth daily.    . B Complex-C-Folic Acid (B-COMPLEX/VITAMIN C) TABS 1 tab DAILY  0  . Cholecalciferol (VITAMIN D) 50 MCG (2000 UT) tablet Take 1 tablet (2,000 Units total) by mouth daily.    [place] denosumab (PROLIA) 60 MG/ML SOSY injection Inject 60 mg into the skin every 6 (six) months.    . ferrous sulfate 325 (65 FE) MG tablet Take 325 mg by mouth daily with breakfast.    . losartan (COZAAR) 100 MG tablet Take 100 mg by mouth daily.    . metFORMIN (GLUCOPHAGE) 500 MG tablet TAKE 1 TABLET BY MOUTH 2 TIMES DAILY WITH A MEAL. 180 tablet 0  . metoprolol tartrate (LOPRESSOR) 25 MG tablet Take 1 tablet (25 mg total) by mouth 2 (two) times daily. 1 Tablet in the AM. 1/2 tablet in the PM. 60 tablet 3  . Multiple Vitamins-Minerals (ICAPS AREDS 2) CAPS Take 1 capsule by mouth daily.    . pantoprazole (PROTONIX) 40 MG tablet Take 40 mg by mouth daily.    . rivaroxaban (XARELTO) 2.5 MG TABS tablet Take 1 tablet (  2.5 mg total) by mouth 2 (two) times daily. 60 tablet 3  . rosuvastatin (CRESTOR) 10 MG tablet Take 10 mg by mouth daily.    . sucralfate (CARAFATE) 1 g tablet Take 1 g by mouth 4 (four) times daily -  with meals and at bedtime.    . Tocilizumab (ACTEMRA IV) Inject into the vein every 30 (thirty) days.     No current facility-administered medications for this visit.     Allergies  Allergen Reactions  . Lipitor [Atorvastatin]   . Tricor [Fenofibrate]     Past Medical History:  Diagnosis Date  . Bladder cancer ([place]) 02/10/2010  . Diabetes mellitus without complication ([place])   . Heart disease   . Hyperlipidemia   . Hypertension   . Peripheral vascular disease of lower extremity ([place])   . Presence of stent in  left circumflex coronary artery     Blood pressure 136/68, pulse 83, resp. rate 15, height 5\' 1"  (1.549 m), weight 106 lb 8 oz (48.3 kg), SpO2 96 %.  Hypertension associated with diabetes [facility]) Patient with essential hypertension, doing well with current regimen.   She is to continue with losartan and metoprolol.  Continue checking home BP readings 3-4 times per week.  Follow up with Dr. Gwenlyn Found in September.     Tommy Medal PharmD CPP [facility] [address] [place], [place] [address] [phone]

## 2018-10-02 NOTE — Assessment & Plan Note (Signed)
Patient with essential hypertension, doing well with current regimen.   She is to continue with losartan and metoprolol.  Continue checking home BP readings 3-4 times per week.  Follow up with Dr. Gwenlyn Found in September.

## 2018-10-04 DIAGNOSIS — M316 Other giant cell arteritis: Secondary | ICD-10-CM | POA: Diagnosis not present

## 2018-10-24 ENCOUNTER — Encounter: Payer: Self-pay | Admitting: Cardiovascular Disease

## 2018-10-24 ENCOUNTER — Other Ambulatory Visit: Payer: Self-pay

## 2018-10-24 ENCOUNTER — Other Ambulatory Visit (HOSPITAL_COMMUNITY)
Admission: RE | Admit: 2018-10-24 | Discharge: 2018-10-24 | Disposition: A | Discharge status: Home / Self Care | Payer: Medicare Other | Source: Other Acute Inpatient Hospital | Attending: Ophthalmology | Admitting: Ophthalmology

## 2018-10-24 ENCOUNTER — Ambulatory Visit (INDEPENDENT_AMBULATORY_CARE_PROVIDER_SITE_OTHER): Payer: Medicare Other | Admitting: Cardiovascular Disease

## 2018-10-24 VITALS — BP 124/62 | HR 68 | Temp 96.8°F | Ht 61.0 in | Wt 103.0 lb

## 2018-10-24 DIAGNOSIS — Z955 Presence of coronary angioplasty implant and graft: Secondary | ICD-10-CM

## 2018-10-24 DIAGNOSIS — Z95828 Presence of other vascular implants and grafts: Secondary | ICD-10-CM

## 2018-10-24 DIAGNOSIS — M316 Other giant cell arteritis: Secondary | ICD-10-CM | POA: Diagnosis not present

## 2018-10-24 DIAGNOSIS — Z008 Encounter for other general examination: Secondary | ICD-10-CM | POA: Diagnosis not present

## 2018-10-24 DIAGNOSIS — E781 Pure hyperglyceridemia: Secondary | ICD-10-CM

## 2018-10-24 DIAGNOSIS — I10 Essential (primary) hypertension: Secondary | ICD-10-CM | POA: Diagnosis not present

## 2018-10-24 DIAGNOSIS — E1159 Type 2 diabetes mellitus with other circulatory complications: Secondary | ICD-10-CM | POA: Diagnosis not present

## 2018-10-24 DIAGNOSIS — H5310 Unspecified subjective visual disturbances: Secondary | ICD-10-CM | POA: Diagnosis not present

## 2018-10-24 LAB — CBC WITH DIFFERENTIAL/PLATELET
Abs Immature Granulocytes: 0.02 10*3/uL (ref 0.00–0.07)
Basophils Absolute: 0 10*3/uL (ref 0.0–0.1)
Basophils Relative: 0 %
Eosinophils Absolute: 0.5 10*3/uL (ref 0.0–0.5)
Eosinophils Relative: 7 %
HCT: 43.4 % (ref 36.0–46.0)
Hemoglobin: 14.2 g/dL (ref 12.0–15.0)
Immature Granulocytes: 0 %
Lymphocytes Relative: 27 %
Lymphs Abs: 1.8 10*3/uL (ref 0.7–4.0)
MCH: 31.5 pg (ref 26.0–34.0)
MCHC: 32.7 g/dL (ref 30.0–36.0)
MCV: 96.2 fL (ref 80.0–100.0)
Monocytes Absolute: 0.7 10*3/uL (ref 0.1–1.0)
Monocytes Relative: 10 %
Neutro Abs: 3.9 10*3/uL (ref 1.7–7.7)
Neutrophils Relative %: 56 %
Platelets: 160 10*3/uL (ref 150–400)
RBC: 4.51 MIL/uL (ref 3.87–5.11)
RDW: 12.8 % (ref 11.5–15.5)
WBC: 6.9 10*3/uL (ref 4.0–10.5)
nRBC: 0 % (ref 0.0–0.2)

## 2018-10-24 LAB — C-REACTIVE PROTEIN: CRP: <0.8 mg/dL (ref <1.0)

## 2018-10-24 LAB — SEDIMENTATION RATE: Sed Rate: 1 mm/hr (ref 0–22)

## 2018-10-24 NOTE — Assessment & Plan Note (Signed)
History of left circumflex stenting by Dr. Hale Drone at [facility] in 2000.  She is remained asymptomatic since.

## 2018-10-24 NOTE — Assessment & Plan Note (Signed)
History of aortobifemoral bypass grafting at [facility] in [place] in 2004.  Recent Doppler studies revealed revealed this to be patent although she did have right and left anastomotic stenoses.  She really denies claudication.

## 2018-10-24 NOTE — Assessment & Plan Note (Signed)
History of essential hypertension with blood pressure measured today at 124/62.  She is on losartan and metoprolol.

## 2018-10-24 NOTE — Progress Notes (Signed)
10/24/2018 Anne Pena   1934/03/26  [id number]  Primary Physician Mellody Dance, DO Primary Cardiologist: [name] MD Garret Reddish, [place], [place]  HPI:  Anne Pena is a 84 y.o.  thin and fit appearing widowed Caucasian female mother of 4, grandmother and 3 grandchildren who is accompanied by 1 of her daughters Anne Pena.  She was referred by Dr. Raliegh Scarlet to be established in my practice because of known history of CAD and PAD.  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    I last saw her in the office 03/14/2018.  Risk factors include 50 pack years of tobacco abuse having quit 7 years ago, treated hypertension, diabetes and hyperlipidemia.  She is never had a heart attack or stroke.  She did have coronary intervention by Dr. Hale Drone at [facility] in 2000.  She had aortobifemoral bypass grafting at [facility] in [place] in 2004.  She denies chest pain, shortness of breath or claudication.  She was told that she did have moderate left ICA stenosis.  Since I saw her 6 months ago she is remained stable.  She denies chest pain or shortness of breath.  Carotid Doppler showed no evidence of ICA stenosis.  Aortoiliac.Doppler showed a patent aortobifemoral bypass graft with distal anastomotic stenoses although she denies claudication.  Current Meds  Medication Sig  . ALPRAZolam (XANAX) 0.5 MG tablet Take 0.5 mg by mouth daily.  . B Complex-C-Folic Acid (B-COMPLEX/VITAMIN C) TABS 1 tab DAILY  . Calcium Carbonate-Vitamin D (CALCIUM 500/D PO) Take 500 mg by mouth 2 (two) times daily.  . Cholecalciferol (VITAMIN D) 50 MCG (2000 UT) tablet Take 1 tablet (2,000 Units total) by mouth daily.  Marland Kitchen denosumab (PROLIA) 60 MG/ML SOSY injection Inject 60 mg into the skin every 6 (six) months.  Marland Kitchen losartan (COZAAR) 100 MG tablet Take 100 mg by mouth daily.  . metFORMIN (GLUCOPHAGE) 500 MG tablet TAKE 1 TABLET BY MOUTH 2 TIMES DAILY WITH A MEAL.  . metoprolol tartrate (LOPRESSOR) 25 MG  tablet Take 1 tablet (25 mg total) by mouth 2 (two) times daily. 1 Tablet in the AM. 1/2 tablet in the PM.  . Multiple Vitamins-Minerals (ICAPS AREDS 2) CAPS Take 1 capsule by mouth daily.  . Omega-3 300 MG CAPS Take 300 mg by mouth daily.  . Omega-3 Fatty Acids (FISH OIL) 1000 MG CPDR Take 1,000 mg by mouth daily.  . rivaroxaban (XARELTO) 2.5 MG TABS tablet Take 1 tablet (2.5 mg total) by mouth 2 (two) times daily.  . rosuvastatin (CRESTOR) 10 MG tablet Take 10 mg by mouth daily.  . Tocilizumab (ACTEMRA IV) Inject into the vein every 30 (thirty) days.  . [DISCONTINUED] ferrous sulfate 325 (65 FE) MG tablet Take 325 mg by mouth daily with breakfast.  . [DISCONTINUED] pantoprazole (PROTONIX) 40 MG tablet Take 40 mg by mouth daily.  . [DISCONTINUED] sucralfate (CARAFATE) 1 g tablet Take 1 g by mouth 4 (four) times daily -  with meals and at bedtime.     Allergies  Allergen Reactions  . Lipitor [Atorvastatin]   . Tricor [Fenofibrate]     Social History   Socioeconomic History  . Marital status: Widowed    Spouse name: Not on file  . Number of children: Not on file  . Years of education: Not on file  . Highest education level: Not on file  Occupational History  . Not on file  Social Needs  . Financial resource strain:  Not on file  . Food insecurity    Worry: Not on file    Inability: Not on file  . Transportation needs    Medical: Not on file    Non-medical: Not on file  Tobacco Use  . Smoking status: Former Smoker    Packs/day: 1.00    Years: 64.00    Pack years: 64.00    Types: Cigarettes    Quit date: 04/06/2011    Years since quitting: 7.5  . Smokeless tobacco: Never Used  Substance and Sexual Activity  . Alcohol use: Never    Frequency: Never  . Drug use: Never  . Sexual activity: Not Currently  Lifestyle  . Physical activity    Days per week: Not on file    Minutes per session: Not on file  . Stress: Not on file  Relationships  . Social Herbalist  on phone: Not on file    Gets together: Not on file    Attends religious service: Not on file    Active member of club or organization: Not on file    Attends meetings of clubs or organizations: Not on file    Relationship status: Not on file  . Intimate partner violence    Fear of current or ex partner: Not on file    Emotionally abused: Not on file    Physically abused: Not on file    Forced sexual activity: Not on file  Other Topics Concern  . Not on file  Social History Narrative  . Not on file     Review of Systems: General: negative for chills, fever, night sweats or weight changes.  Cardiovascular: negative for chest pain, dyspnea on exertion, edema, orthopnea, palpitations, paroxysmal nocturnal dyspnea or shortness of breath Dermatological: negative for rash Respiratory: negative for cough or wheezing Urologic: negative for hematuria Abdominal: negative for nausea, vomiting, diarrhea, bright red blood per rectum, melena, or hematemesis Neurologic: negative for visual changes, syncope, or dizziness All other systems reviewed and are otherwise negative except as noted above.    Blood pressure 124/62, pulse 68, temperature (!) 96.8 F (36 C), height 5\' 1"  (1.549 m), weight 103 lb (46.7 kg).  General appearance: alert and no distress Neck: no adenopathy, no carotid bruit, no JVD, supple, symmetrical, trachea midline and thyroid not enlarged, symmetric, no tenderness/mass/nodules Lungs: clear to auscultation bilaterally Heart: regular rate and rhythm, S1, S2 normal, no murmur, click, rub or gallop Extremities: extremities normal, atraumatic, no cyanosis or edema Pulses: 2+ and symmetric Skin: Skin color, texture, turgor normal. No rashes or lesions Neurologic: Alert and oriented X 3, normal strength and tone. Normal symmetric reflexes. Normal coordination and gait  EKG sinus rhythm at 68 without ST or T wave changes.  I personally reviewed this EKG.  ASSESSMENT AND PLAN:    Presence of stent in left circumflex coronary artery History of left circumflex stenting by Dr. Hale Drone at [facility] in [facility].  She is remained asymptomatic since.  S/P aorto-bifemoral bypass surgery History of aortobifemoral bypass grafting at [facility] in [place] in 2004.  Recent Doppler studies revealed revealed this to be patent although she did have right and left anastomotic stenoses.  She really denies claudication.  Hypertension associated with diabetes ([place]) History of essential hypertension with blood pressure measured today at 124/62.  She is on losartan and metoprolol.  Hypertriglyceridemia History of hyperlipidemia on Crestor with lipid profile performed 05/08/2018 revealing total cholesterol 158, LDL 69 HDL 37.  [name] MD FACP,FACC,FAHA, [facility] 10/24/2018 3:28 PM

## 2018-10-24 NOTE — Patient Instructions (Signed)

## 2018-10-24 NOTE — Assessment & Plan Note (Signed)
History of hyperlipidemia on Crestor with lipid profile performed 05/08/2018 revealing total cholesterol 158, LDL 69 HDL 37.

## 2018-11-01 DIAGNOSIS — M316 Other giant cell arteritis: Secondary | ICD-10-CM | POA: Diagnosis not present

## 2018-11-08 ENCOUNTER — Other Ambulatory Visit: Payer: Self-pay | Admitting: Family Medicine

## 2018-11-08 DIAGNOSIS — E1169 Type 2 diabetes mellitus with other specified complication: Secondary | ICD-10-CM

## 2018-11-17 ENCOUNTER — Other Ambulatory Visit: Payer: Self-pay | Admitting: Cardiovascular Disease

## 2018-11-29 DIAGNOSIS — M0609 Rheumatoid arthritis without rheumatoid factor, multiple sites: Secondary | ICD-10-CM | POA: Diagnosis not present

## 2018-11-29 DIAGNOSIS — M316 Other giant cell arteritis: Secondary | ICD-10-CM | POA: Diagnosis not present

## 2018-12-04 DIAGNOSIS — R5382 Chronic fatigue, unspecified: Secondary | ICD-10-CM | POA: Diagnosis not present

## 2018-12-04 DIAGNOSIS — I7090 Unspecified atherosclerosis: Secondary | ICD-10-CM | POA: Diagnosis not present

## 2018-12-04 DIAGNOSIS — M0609 Rheumatoid arthritis without rheumatoid factor, multiple sites: Secondary | ICD-10-CM | POA: Diagnosis not present

## 2018-12-04 DIAGNOSIS — M810 Age-related osteoporosis without current pathological fracture: Secondary | ICD-10-CM | POA: Diagnosis not present

## 2018-12-04 DIAGNOSIS — F5101 Primary insomnia: Secondary | ICD-10-CM | POA: Diagnosis not present

## 2018-12-04 DIAGNOSIS — Z79899 Other long term (current) drug therapy: Secondary | ICD-10-CM | POA: Diagnosis not present

## 2018-12-04 DIAGNOSIS — D508 Other iron deficiency anemias: Secondary | ICD-10-CM | POA: Diagnosis not present

## 2018-12-04 DIAGNOSIS — M316 Other giant cell arteritis: Secondary | ICD-10-CM | POA: Diagnosis not present

## 2018-12-04 DIAGNOSIS — Z6820 Body mass index (BMI) 20.0-20.9, adult: Secondary | ICD-10-CM | POA: Diagnosis not present

## 2018-12-05 ENCOUNTER — Other Ambulatory Visit: Payer: Self-pay | Admitting: Cardiovascular Disease

## 2018-12-05 NOTE — Telephone Encounter (Signed)
40f 46.7kg 0.77scr (05/08/18) ccr 42.2 Lovw/berry 10/24/18

## 2018-12-05 NOTE — Telephone Encounter (Signed)
Please review for refill. Thanks!  

## 2018-12-05 NOTE — Telephone Encounter (Signed)
41f 46.7kg 0.77scr (05/08/18) ccr 42.2 Lovw/berry 10/24/18

## 2018-12-08 DIAGNOSIS — Z23 Encounter for immunization: Secondary | ICD-10-CM | POA: Diagnosis not present

## 2018-12-19 ENCOUNTER — Other Ambulatory Visit: Payer: Self-pay

## 2018-12-19 ENCOUNTER — Encounter: Payer: Self-pay | Admitting: Family Medicine

## 2018-12-19 ENCOUNTER — Ambulatory Visit (INDEPENDENT_AMBULATORY_CARE_PROVIDER_SITE_OTHER): Payer: Medicare Other | Admitting: Family Medicine

## 2018-12-19 VITALS — BP 166/79 | HR 76 | Temp 98.3°F | Resp 12 | Ht 61.0 in | Wt 113.4 lb

## 2018-12-19 DIAGNOSIS — E782 Mixed hyperlipidemia: Secondary | ICD-10-CM | POA: Diagnosis not present

## 2018-12-19 DIAGNOSIS — E559 Vitamin D deficiency, unspecified: Secondary | ICD-10-CM

## 2018-12-19 DIAGNOSIS — E1159 Type 2 diabetes mellitus with other circulatory complications: Secondary | ICD-10-CM | POA: Diagnosis not present

## 2018-12-19 DIAGNOSIS — E1169 Type 2 diabetes mellitus with other specified complication: Secondary | ICD-10-CM

## 2018-12-19 DIAGNOSIS — I10 Essential (primary) hypertension: Secondary | ICD-10-CM

## 2018-12-19 LAB — POCT GLYCOSYLATED HEMOGLOBIN (HGB A1C): Hemoglobin A1C: 6.5 % — AB (ref 4.0–5.6)

## 2018-12-19 LAB — POCT UA - MICROALBUMIN
Albumin/Creatinine Ratio, Urine, POC: <30
Creatinine, POC: 200 mg/dL
Microalbumin Ur, POC: 10 mg/L

## 2018-12-19 MED ORDER — METFORMIN HCL 500 MG PO TABS: 500.0000 mg | ORAL_TABLET | Freq: Two times a day (BID) | ORAL | 1 refills | Status: DC

## 2018-12-19 NOTE — Progress Notes (Signed)
Impression and Recommendations:    1. Type 2 diabetes mellitus with other specified complication, without long-term current use of insulin ([place])   2. Hypertension associated with diabetes ([place])   3. Mixed diabetic hyperlipidemia associated with type 2 diabetes mellitus ([place])   4. Vitamin D deficiency      Type 2 Diabetes Mellitus - Well controlled, stable at this time. - A1c today measured at 6.5, at goal. - No significant amount of protein found in urine today.  - Continue treatment plan as established.  - Counseled patient on pathophysiology of disease and discussed various treatment options, which always includes dietary and lifestyle modification as first line.    - Importance of low carb, heart-healthy diet discussed with patient in addition to regular aerobic exercise of 38min 5d/week or more.   - Check FBS and 2 hours after the biggest meal of your day.  Keep log and bring in next OV for my review.     - Also told patient if you ever feel poorly, please check your blood pressure and blood sugar, as one or the other could be the cause of your symptoms.  - Pt reminded about need for yearly eye and foot exams.  Told patient to make appt.for diabetic eye exam, CMAs here will do foot exams  - Handouts provided at patient's desire and or told to go online at the American Diabetes Association website for further information  - Will continue to monitor.  Hypertension associated with DM - Treatment plan managed by Dr. Gwenlyn Found. - Stable at this time.  - Continue management and follow-up with cardiology as established.  - Lifestyle changes such as dash and heart healthy diets and engaging in a regular exercise program discussed extensively with patient.   - Discussed prudent ambulatory blood pressure monitoring during appointment today. - Advised her to check BP after sitting still for 15-20 minutes, and avoiding stimulation.  - Ambulatory blood pressure monitoring  encouraged at least 3 times weekly.  Keep log and bring in every office visit.  Reminded patient that if they ever feel poorly in any way, to check their blood pressure and pulse.  - Handout provided today.  - Will continue to monitor.  Mixed Diabetic Hyperlipidemia - Treatment plan managed by Dr. Gwenlyn Found. - Continue medications as established. - Tolerating well without S-E.  - Cholesterol at goal last check. - Last LDL less than 70, last checked 05/08/2018  - Will continue to monitor and re-check as recommended.  Vitamin D Deficiency - Continue supplementation as established. - Will continue to monitor and re-check as recommended.  Lifestyle & Preventative Health Maintenance - Advised patient to continue working toward exercising to improve overall mental, physical, and emotional health.    - To avoid hurting her back, encouraged the patient to walk on flat, even, forgiving surfaces.  - At night, to avoid screen exposure, advised patient to engage in activities other than watching TV, such as reading from an actual book instead of an e-book.  - Reviewed the "spokes of the wheel" of mood and health management.  Stressed the importance of ongoing prudent habits, including regular exercise, appropriate sleep hygiene, healthful dietary habits, and prayer/meditation to relax.  - Encouraged patient to engage in daily physical activity as tolerated, especially a formal exercise routine.  Recommended that the patient eventually strive for at least 150 minutes of moderate cardiovascular activity per week according to guidelines established by the [facility].   - Healthy dietary  habits encouraged, including low-carb, and high amounts of lean protein in diet.   - Patient should also consume adequate amounts of water.  - Health counseling performed.  All questions answered.  Recommendations - Return in 3 months for Medicare Wellness. - In 6 months, return for DM chronic f/up.   Orders Placed  This Encounter  Procedures  . POC Hemoglobin A1c (dx code Z13.1)  . POCT UA - Microalbumin    Meds ordered this encounter  Medications  . metFORMIN (GLUCOPHAGE) 500 MG tablet    Sig: Take 1 tablet (500 mg total) by mouth 2 (two) times daily with a meal.    Dispense:  180 tablet    Refill:  1    Ov needed for future RF    Medications Discontinued During This Encounter  Medication Reason  . Omega-3 300 MG CAPS Error  . metFORMIN (GLUCOPHAGE) 500 MG tablet Reorder     Gross side effects, risk and benefits, and alternatives of medications and treatment plan in general discussed with patient.  Patient is aware that all medications have potential side effects and we are unable to predict every side effect or drug-drug interaction that may occur.   Patient will call with any questions prior to using medication if they have concerns.    Expresses verbal understanding and consents to current therapy and treatment regimen.  No barriers to understanding were identified.  Red flag symptoms and signs discussed in detail.  Patient expressed understanding regarding what to do in case of emergency\urgent symptoms  Please see AVS handed out to patient at the end of our visit for further patient instructions/ counseling done pertaining to today's office visit.   Return for Medicare Wellness in 3 months; and 6 months for chronic f/up- Bp, DM,.     Note:  This note was prepared with assistance of Dragon voice recognition software. Occasional wrong-word or sound-a-like substitutions may have occurred due to the inherent limitations of voice recognition software.   This document serves as a record of services personally performed by Mellody Dance, DO. It was created on her behalf by Toni Amend, a trained medical scribe. The creation of this record is based on the scribe's personal observations and the provider's statements to them.   This case required medical decision making of at least  moderate complexity. The above documentation has been reviewed to be accurate and was completed by Marjory Sneddon, D.O.      --------------------------------------------------------------------------------------------------------------------------------------------------------------------------------------------------------------------------------------------    Subjective:    Phillips Odor, am serving as scribe for Dr. Mellody Dance.  HPI: Anne Pena is a 84 y.o. female who presents to [facility] [facility] today for issues as discussed below.  - Eating Notes she doesn't typically eat much for lunch because she usually eats raisin bran with blueberries around 10 AM for breakfast.  Says for lunch she may eat a yogurt.  She snacks on peanuts sometimes; "I like Ritz crackers."  Notes her main concern in life nowadays has been her adjustment to living with family members other than her husband.  - Physical Activity For activity and fun at home, notes "I'm kinda lazy on that."  Says "I don't walk like I should because I'm afraid of dogs."  - Sleep Notes likes to go to bed around 10-11.  Doesn't go to sleep as quickly after using her ipad, and recognizes that the issue is screen time.  Notes "gets up at least once during the night" and  usually sleeps until 9 AM the next morning.  HPI:  Hypertension:  -  Her blood pressure at home has been running: "Maybe 140's-150's."  Says "to be honest with you, I kept it for two months for [Dr. Berry] and took it to him and he was not concerned with anything he saw."  Says "since I did that, I don't ever think about checking it anymore.  Every now and then, I get a little lightheaded, and I take my blood sugar and blood pressure."  Thinks it's "usually 140-158 or something like that."  Dr. Gwenlyn Found continues to manage her treatment plan.  - Patient reports good compliance with medication and/or lifestyle modification   - Her denies acute concerns or problems related to treatment plan  - She denies new onset of: chest pain, exercise intolerance, shortness of breath, dizziness, visual changes, headache, lower extremity swelling or claudication.   Last 3 blood pressure readings in our office are as follows: BP Readings from Last 3 Encounters:  12/19/18 (!) 166/79  10/24/18 124/62  10/02/18 136/68   Filed Weights   12/19/18 1457  Weight: 113 lb 6.4 oz (51.4 kg)    HPI:   Diabetes Mellitus:  Home glucose readings:  Doesn't check it every day; notes "when I try to check it, it runs 130-140."  Denies low blood sugars.   - Patient reports good compliance with therapy plan: medication and/or lifestyle modification  - Her denies acute concerns or problems related to treatment plan  - She denies new concerns.  Denies polyuria/polydipsia, hypo/ hyperglycemia symptoms.  Denies new onset of: chest pain, exercise intolerance, shortness of breath, dizziness, visual changes, headache, lower extremity swelling or claudication.   Last A1C in the office was:  Lab Results  Component Value Date   HGBA1C 6.5 (A) 12/19/2018   HGBA1C 6.0 (H) 05/08/2018   Lab Results  Component Value Date   MICROALBUR 10 12/19/2018   LDLCALC 69 05/08/2018   CREATININE 0.77 05/08/2018   BP Readings from Last 3 Encounters:  12/19/18 (!) 166/79  10/24/18 124/62  10/02/18 136/68   Wt Readings from Last 3 Encounters:  12/19/18 113 lb 6.4 oz (51.4 kg)  10/24/18 103 lb (46.7 kg)  10/02/18 106 lb 8 oz (48.3 kg)      Wt Readings from Last 3 Encounters:  12/19/18 113 lb 6.4 oz (51.4 kg)  10/24/18 103 lb (46.7 kg)  10/02/18 106 lb 8 oz (48.3 kg)   BP Readings from Last 3 Encounters:  12/19/18 (!) 166/79  10/24/18 124/62  10/02/18 136/68   Pulse Readings from Last 3 Encounters:  12/19/18 76  10/24/18 68  10/02/18 83   BMI Readings from Last 3 Encounters:  12/19/18 21.43 kg/m  10/24/18 19.46 kg/m  10/02/18 20.12  kg/m     Patient Care Team    Relationship Specialty Notifications Start End  Mellody Dance, DO PCP - General Family Medicine  12/08/17      Patient Active Problem List   Diagnosis Date Noted  . Presence of stent in left circumflex coronary artery 12/08/2017    Priority: High  . S/P aorto-bifemoral bypass surgery 12/08/2017    Priority: High  . Peripheral vascular disease ([place]) 12/08/2017    Priority: High  . Peripheral arterial disease ([place]) 12/08/2017    Priority: High  . Diabetes mellitus ([place]) 12/08/2017    Priority: Medium  . Hypertension associated with diabetes ([place]) 12/08/2017    Priority: Medium  . Mixed diabetic hyperlipidemia associated with  type 2 diabetes mellitus ([place]) 12/08/2017    Priority: Medium  . Bladder cancer ([place]) 02/10/2010    Priority: Medium  . Vitamin D deficiency 05/10/2018    Priority: Low  . Vitamin B 12 deficiency 05/10/2018    Priority: Low  . Adjustment disorder with anxious mood 12/08/2017    Priority: Low  . Insomnia 12/08/2017    Priority: Low  . Hypertriglyceridemia 05/10/2018  . Low HDL (under 40) 05/10/2018  . Subclinical hypothyroidism 05/10/2018    Past Medical history, Surgical history, Family history, Social history, Allergies and Medications have been entered into the medical record, reviewed and changed as needed.    Current Meds  Medication Sig  . ALPRAZolam (XANAX) 0.5 MG tablet Take 0.5 mg by mouth daily.  . B Complex-C-Folic Acid (B-COMPLEX/VITAMIN C) TABS 1 tab DAILY  . Calcium Carbonate-Vitamin D (CALCIUM 500/D PO) Take 500 mg by mouth 2 (two) times daily.  . Cholecalciferol (VITAMIN D) 50 MCG (2000 UT) tablet Take 1 tablet (2,000 Units total) by mouth daily.  Marland Kitchen denosumab (PROLIA) 60 MG/ML SOSY injection Inject 60 mg into the skin every 6 (six) months.  Marland Kitchen losartan (COZAAR) 100 MG tablet TAKE 1 TABLET BY MOUTH DAILY. (PRELACES VALSARTAN THAT WAS ON BACKORDER)  . metFORMIN (GLUCOPHAGE) 500 MG tablet Take 1 tablet  (500 mg total) by mouth 2 (two) times daily with a meal.  . metoprolol tartrate (LOPRESSOR) 25 MG tablet Take 1 tablet (25 mg total) by mouth 2 (two) times daily. 1 Tablet in the AM. 1/2 tablet in the PM.  . Multiple Vitamins-Minerals (ICAPS AREDS 2) CAPS Take 1 capsule by mouth daily.  . Omega-3 Fatty Acids (FISH OIL) 1000 MG CPDR Take 1,000 mg by mouth daily.  . rosuvastatin (CRESTOR) 10 MG tablet Take 10 mg by mouth daily.  . Tocilizumab (ACTEMRA IV) Inject into the vein every 30 (thirty) days.  Alveda Reasons 2.5 MG TABS tablet TAKE 1 TABLET (2.5 MG TOTAL) BY MOUTH 2 (TWO) TIMES DAILY  . [DISCONTINUED] metFORMIN (GLUCOPHAGE) 500 MG tablet Take 1 tablet (500 mg total) by mouth 2 (two) times daily with a meal. OFFICE VISIT REQUIRED PRIOR TO ANY FURTHER REFILLS    Allergies:  Allergies  Allergen Reactions  . Lipitor [Atorvastatin]   . Tricor [Fenofibrate]      Review of Systems:  A fourteen system review of systems was performed and found to be positive as per HPI.   Objective:   Blood pressure (!) 166/79, pulse 76, temperature 98.3 F (36.8 C), temperature source Oral, resp. rate 12, height 5\' 1"  (1.549 m), weight 113 lb 6.4 oz (51.4 kg), SpO2 97 %. Body mass index is 21.43 kg/m. General:  Well Developed, well nourished, appropriate for stated age.  Neuro:  Alert and oriented,  extra-ocular muscles intact  HEENT:  Normocephalic, atraumatic, neck supple, no carotid bruits appreciated  Skin:  no gross rash, warm, pink. Cardiac:  RRR, S1 S2 Respiratory:  ECTA B/L and A/P, Not using accessory muscles, speaking in full sentences- unlabored. Vascular:  Ext warm, no cyanosis apprec.; cap RF less 2 sec. Psych:  No HI/SI, judgement and insight good, Euthymic mood. Full Affect.

## 2018-12-19 NOTE — Patient Instructions (Addendum)
Check your blood pressure and pulse (heart rate) at home once weekly at least. Write your blood pressure and pulse down in a notebook that you keep and bring to all doctor visits.  Your goal blood pressure should be 135/85 or less on a regular basis, her medications should be started.  Normal blood pressure is 120/80 or less.    Hypertension Hypertension, commonly called high blood pressure, is when the force of blood pumping through the arteries is too strong. The arteries are the blood vessels that carry blood from the heart throughout the body. Hypertension forces the heart to work harder to pump blood and may cause arteries to become narrow or stiff. Having untreated or uncontrolled hypertension can cause heart attacks, strokes, kidney disease, and other problems. A blood pressure reading consists of a higher number over a lower number. Ideally, your blood pressure should be below 120/80. The first ("top") number is called the systolic pressure. It is a measure of the pressure in your arteries as your heart beats. The second ("bottom") number is called the diastolic pressure. It is a measure of the pressure in your arteries as the heart relaxes. What are the causes? The cause of this condition is not known. What increases the risk? Some risk factors for high blood pressure are under your control. Others are not. Factors you can change  Smoking.  Having type 2 diabetes mellitus, high cholesterol, or both.  Not getting enough exercise or physical activity.  Being overweight.  Having too much fat, sugar, calories, or salt (sodium) in your diet.  Drinking too much alcohol. Factors that are difficult or impossible to change  Having chronic kidney disease.  Having a family history of high blood pressure.  Age. Risk increases with age.  Race. You may be at higher risk if you are African-American.  Gender. Men are at higher risk than women before age 90. After age 57, women are at  higher risk than men.  Having obstructive sleep apnea.  Stress. What are the signs or symptoms? Extremely high blood pressure (hypertensive crisis) may cause:  Headache.  Anxiety.  Shortness of breath.  Nosebleed.  Nausea and vomiting.  Severe chest pain.  Jerky movements you cannot control (seizures).  How is this diagnosed? This condition is diagnosed by measuring your blood pressure while you are seated, with your arm resting on a surface. The cuff of the blood pressure monitor will be placed directly against the skin of your upper arm at the level of your heart. It should be measured at least twice using the same arm. Certain conditions can cause a difference in blood pressure between your right and left arms. Certain factors can cause blood pressure readings to be lower or higher than normal (elevated) for a short period of time:  When your blood pressure is higher when you are in a health care provider's office than when you are at home, this is called white coat hypertension. Most people with this condition do not need medicines.  When your blood pressure is higher at home than when you are in a health care provider's office, this is called masked hypertension. Most people with this condition may need medicines to control blood pressure.  If you have a high blood pressure reading during one visit or you have normal blood pressure with other risk factors:  You may be asked to return on a different day to have your blood pressure checked again.  You may be asked to monitor your  blood pressure at home for 1 week or longer.  If you are diagnosed with hypertension, you may have other blood or imaging tests to help your health care provider understand your overall risk for other conditions. How is this treated? This condition is treated by making healthy lifestyle changes, such as eating healthy foods, exercising more, and reducing your alcohol intake. Your health care provider  may prescribe medicine if lifestyle changes are not enough to get your blood pressure under control, and if:  Your systolic blood pressure is above 130.  Your diastolic blood pressure is above 80.  Your personal target blood pressure may vary depending on your medical conditions, your age, and other factors. Follow these instructions at home: Eating and drinking  Eat a diet that is high in fiber and potassium, and low in sodium, added sugar, and fat. An example eating plan is called the DASH (Dietary Approaches to Stop Hypertension) diet. To eat this way: ? Eat plenty of fresh fruits and vegetables. Try to fill half of your plate at each meal with fruits and vegetables. ? Eat whole grains, such as whole wheat pasta, brown rice, or whole grain bread. Fill about one quarter of your plate with whole grains. ? Eat or drink low-fat dairy products, such as skim milk or low-fat yogurt. ? Avoid fatty cuts of meat, processed or cured meats, and poultry with skin. Fill about one quarter of your plate with lean proteins, such as fish, chicken without skin, beans, eggs, and tofu. ? Avoid premade and processed foods. These tend to be higher in sodium, added sugar, and fat.  Reduce your daily sodium intake. Most people with hypertension should eat less than 1,500 mg of sodium a day.  Limit alcohol intake to no more than 1 drink a day for nonpregnant women and 2 drinks a day for men. One drink equals 12 oz of beer, 5 oz of wine, or 1 oz of hard liquor. Lifestyle  Work with your health care provider to maintain a healthy body weight or to lose weight. Ask what an ideal weight is for you.  Get at least 30 minutes of exercise that causes your heart to beat faster (aerobic exercise) most days of the week. Activities may include walking, swimming, or biking.  Include exercise to strengthen your muscles (resistance exercise), such as pilates or lifting weights, as part of your weekly exercise routine. Try to  do these types of exercises for 30 minutes at least 3 days a week.  Do not use any products that contain nicotine or tobacco, such as cigarettes and e-cigarettes. If you need help quitting, ask your health care provider.  Monitor your blood pressure at home as told by your health care provider.  Keep all follow-up visits as told by your health care provider. This is important. Medicines  Take over-the-counter and prescription medicines only as told by your health care provider. Follow directions carefully. Blood pressure medicines must be taken as prescribed.  Do not skip doses of blood pressure medicine. Doing this puts you at risk for problems and can make the medicine less effective.  Ask your health care provider about side effects or reactions to medicines that you should watch for. Contact a health care provider if:  You think you are having a reaction to a medicine you are taking.  You have headaches that keep coming back (recurring).  You feel dizzy.  You have swelling in your ankles.  You have trouble with your vision. Get  help right away if:  You develop a severe headache or confusion.  You have unusual weakness or numbness.  You feel faint.  You have severe pain in your chest or abdomen.  You vomit repeatedly.  You have trouble breathing. Summary  Hypertension is when the force of blood pumping through your arteries is too strong. If this condition is not controlled, it may put you at risk for serious complications.  Your personal target blood pressure may vary depending on your medical conditions, your age, and other factors. For most people, a normal blood pressure is less than 120/80.  Hypertension is treated with lifestyle changes, medicines, or a combination of both. Lifestyle changes include weight loss, eating a healthy, low-sodium diet, exercising more, and limiting alcohol. This information is not intended to replace advice given to you by your health  care provider. Make sure you discuss any questions you have with your health care provider. Document Released: 01/25/2005 Document Revised: 12/24/2015 Document Reviewed: 12/24/2015 Elsevier Interactive Patient Education  2018 [employer].    How to Take Your Blood Pressure   Blood pressure is a measurement of how strongly your blood is pressing against the walls of your arteries. Arteries are blood vessels that carry blood from your heart throughout your body. Your health care provider takes your blood pressure at each office visit. You can also take your own blood pressure at home with a blood pressure machine. You may need to take your own blood pressure:  To confirm a diagnosis of high blood pressure (hypertension).  To monitor your blood pressure over time.  To make sure your blood pressure medicine is working.  Supplies needed: To take your blood pressure, you will need a blood pressure machine. You can buy a blood pressure machine, or blood pressure monitor, at most drugstores or online. There are several types of home blood pressure monitors. When choosing one, consider the following:  Choose a monitor that has an arm cuff.  Choose a monitor that wraps snugly around your upper arm. You should be able to fit only one finger between your arm and the cuff.  Do not choose a monitor that measures your blood pressure from your wrist or finger.  Your health care provider can suggest a reliable monitor that will meet your needs. How to prepare To get the most accurate reading, avoid the following for 30 minutes before you check your blood pressure:  Drinking caffeine.  Drinking alcohol.  Eating.  Smoking.  Exercising.  Five minutes before you check your blood pressure:  Empty your bladder.  Sit quietly without talking in a dining chair, rather than in a soft couch or armchair.  How to take your blood pressure To check your blood pressure, follow the instructions in the  manual that came with your blood pressure monitor. If you have a digital blood pressure monitor, the instructions may be as follows: 1. Sit up straight. 2. Place your feet on the floor. Do not cross your ankles or legs. 3. Rest your left arm at the level of your heart on a table or desk or on the arm of a chair. 4. Pull up your shirt sleeve. 5. Wrap the blood pressure cuff around the upper part of your left arm, 1 inch (2.5 cm) above your elbow. It is best to wrap the cuff around bare skin. 6. Fit the cuff snugly around your arm. You should be able to place only one finger between the cuff and your arm. 7.  Position the cord inside the groove of your elbow. 8. Press the power button. 9. Sit quietly while the cuff inflates and deflates. 10. Read the digital reading on the monitor screen and write it down (record it). 11. Wait 2-3 minutes, then repeat the steps, starting at step 1.  What does my blood pressure reading mean? A blood pressure reading consists of a higher number over a lower number. Ideally, your blood pressure should be below 120/80. The first ("top") number is called the systolic pressure. It is a measure of the pressure in your arteries as your heart beats. The second ("bottom") number is called the diastolic pressure. It is a measure of the pressure in your arteries as the heart relaxes. Blood pressure is classified into four stages. The following are the stages for adults who do not have a short-term serious illness or a chronic condition. Systolic pressure and diastolic pressure are measured in a unit called mm Hg. Normal  Systolic pressure: below 123456.  Diastolic pressure: below 80. Elevated  Systolic pressure: Q000111Q.  Diastolic pressure: below 80. Hypertension stage 1  Systolic pressure: 0000000.  Diastolic pressure: [id number]. Hypertension stage 2  Systolic pressure: XX123456 or above.  Diastolic pressure: 90 or above. You can have prehypertension or hypertension even  if only the systolic or only the diastolic number in your reading is higher than normal. Follow these instructions at home:  Check your blood pressure as often as recommended by your health care provider.  Take your monitor to the next appointment with your health care provider to make sure: ? That you are using it correctly. ? That it provides accurate readings.  Be sure you understand what your goal blood pressure numbers are.  Tell your health care provider if you are having any side effects from blood pressure medicine. Contact a health care provider if:  Your blood pressure is consistently high. Get help right away if:  Your systolic blood pressure is higher than 180.  Your diastolic blood pressure is higher than 110. This information is not intended to replace advice given to you by your health care provider. Make sure you discuss any questions you have with your health care provider. Document Released: 07/04/2015 Document Revised: 09/16/2015 Document Reviewed: 07/04/2015 Elsevier Interactive Patient Education  [employer].

## 2018-12-27 DIAGNOSIS — M0609 Rheumatoid arthritis without rheumatoid factor, multiple sites: Secondary | ICD-10-CM | POA: Diagnosis not present

## 2018-12-27 DIAGNOSIS — Z79899 Other long term (current) drug therapy: Secondary | ICD-10-CM | POA: Diagnosis not present

## 2018-12-27 DIAGNOSIS — M316 Other giant cell arteritis: Secondary | ICD-10-CM | POA: Diagnosis not present

## 2018-12-27 LAB — CBC AND DIFFERENTIAL
HCT: 41 (ref 36–46)
Hemoglobin: 14.3 (ref 12.0–16.0)
Platelets: 162 (ref 150–399)
WBC: 8

## 2018-12-27 LAB — HEPATIC FUNCTION PANEL
ALT: 20 (ref 7–35)
AST: 18 (ref 13–35)

## 2018-12-27 LAB — BASIC METABOLIC PANEL
CO2: 20 (ref 13–22)
Chloride: 104 (ref 99–108)
Creatinine: 1 (ref 0.5–1.1)
Glucose: 140
Potassium: 4.7 (ref 3.4–5.3)
Sodium: 142 (ref 137–147)

## 2018-12-27 LAB — COMPREHENSIVE METABOLIC PANEL
Calcium: 10 (ref 8.7–10.7)
GFR calc non Af Amer: 53

## 2019-01-10 ENCOUNTER — Other Ambulatory Visit: Payer: Self-pay

## 2019-01-11 ENCOUNTER — Telehealth: Payer: Self-pay | Admitting: Cardiovascular Disease

## 2019-01-11 MED ORDER — ROSUVASTATIN CALCIUM 10 MG PO TABS: 10.0000 mg | ORAL_TABLET | Freq: Every day | ORAL | 2 refills | Status: DC

## 2019-01-11 NOTE — Telephone Encounter (Signed)
Requested Prescriptions   Signed Prescriptions Disp Refills  . rosuvastatin (CRESTOR) 10 MG tablet 90 tablet 2    Sig: Take 1 tablet (10 mg total) by mouth daily.    Authorizing Provider: [name]    Ordering User: Raelene Bott, BRANDY L

## 2019-01-11 NOTE — Telephone Encounter (Signed)
Pharmacy called following up on refill of ROSUVASTATIN  For this pt please.

## 2019-01-26 ENCOUNTER — Other Ambulatory Visit: Payer: Self-pay | Admitting: Cardiovascular Disease

## 2019-01-29 ENCOUNTER — Telehealth: Payer: Self-pay

## 2019-01-29 NOTE — Telephone Encounter (Signed)
   [facility] Pre-operative Risk Assessment    Request for surgical clearance:  1. What type of surgery is being performed? EXTRACTUION OF 1   2. When is this surgery scheduled? TBD   3. What type of clearance is required (medical clearance vs. Pharmacy clearance to hold med vs. Both)? MEDICAL  4. Are there any medications that need to be held prior to surgery and how long?NONE   5. Practice name and name of physician performing surgery? [place]    6. What is your office phone number  [phone]    7.   What is your office fax number  [phone]  8.   Anesthesia type (None, local, MAC, general) ? GENERAL

## 2019-01-30 ENCOUNTER — Encounter: Payer: Self-pay | Admitting: Cardiology

## 2019-01-30 ENCOUNTER — Other Ambulatory Visit: Payer: Self-pay

## 2019-01-30 MED ORDER — METOPROLOL TARTRATE 25 MG PO TABS: ORAL_TABLET | ORAL | 5 refills | Status: DC

## 2019-01-30 NOTE — Telephone Encounter (Signed)
Pharmacy called in and stated they had not received prescription. Resending electronically.

## 2019-01-30 NOTE — Telephone Encounter (Signed)
   Primary Cardiologist: Quay Burow, MD  Chart reviewed as part of pre-operative protocol coverage. Simple dental extractions are considered low risk procedures per guidelines and generally do not require any specific cardiac clearance. It is also generally accepted that for simple extractions and dental cleanings, there is no need to interrupt blood thinner therapy.   SBE prophylaxis is not required for the patient.  I will route this recommendation to the requesting party via Epic fax function and remove from pre-op pool.  Please call with questions.  Daune Perch, NP 01/30/2019, 8:26 AM

## 2019-01-31 NOTE — Telephone Encounter (Signed)
    Dental provider requesting office note and labs

## 2019-01-31 NOTE — Telephone Encounter (Signed)
Labs

## 2019-01-31 NOTE — Telephone Encounter (Signed)
Office note from 10/24/2018 and labs efaxed to [facility] of the [facility]

## 2019-03-06 DIAGNOSIS — M316 Other giant cell arteritis: Secondary | ICD-10-CM | POA: Diagnosis not present

## 2019-03-09 DIAGNOSIS — Z6820 Body mass index (BMI) 20.0-20.9, adult: Secondary | ICD-10-CM | POA: Diagnosis not present

## 2019-03-09 DIAGNOSIS — F5101 Primary insomnia: Secondary | ICD-10-CM | POA: Diagnosis not present

## 2019-03-09 DIAGNOSIS — I7090 Unspecified atherosclerosis: Secondary | ICD-10-CM | POA: Diagnosis not present

## 2019-03-09 DIAGNOSIS — M810 Age-related osteoporosis without current pathological fracture: Secondary | ICD-10-CM | POA: Diagnosis not present

## 2019-03-09 DIAGNOSIS — M0609 Rheumatoid arthritis without rheumatoid factor, multiple sites: Secondary | ICD-10-CM | POA: Diagnosis not present

## 2019-03-09 DIAGNOSIS — M316 Other giant cell arteritis: Secondary | ICD-10-CM | POA: Diagnosis not present

## 2019-03-09 DIAGNOSIS — Z79899 Other long term (current) drug therapy: Secondary | ICD-10-CM | POA: Diagnosis not present

## 2019-03-09 DIAGNOSIS — R5382 Chronic fatigue, unspecified: Secondary | ICD-10-CM | POA: Diagnosis not present

## 2019-03-09 DIAGNOSIS — D508 Other iron deficiency anemias: Secondary | ICD-10-CM | POA: Diagnosis not present

## 2019-03-27 ENCOUNTER — Other Ambulatory Visit (HOSPITAL_COMMUNITY): Payer: Self-pay | Admitting: Cardiovascular Disease

## 2019-03-27 DIAGNOSIS — Z95828 Presence of other vascular implants and grafts: Secondary | ICD-10-CM

## 2019-04-03 DIAGNOSIS — M0609 Rheumatoid arthritis without rheumatoid factor, multiple sites: Secondary | ICD-10-CM | POA: Diagnosis not present

## 2019-04-03 DIAGNOSIS — Z79899 Other long term (current) drug therapy: Secondary | ICD-10-CM | POA: Diagnosis not present

## 2019-04-06 LAB — HEPATIC FUNCTION PANEL
ALT: 15 (ref 7–35)
AST: 17 (ref 13–35)
Alkaline Phosphatase: 57 (ref 25–125)
Bilirubin, Total: 0.9

## 2019-04-06 LAB — CBC AND DIFFERENTIAL
HCT: 40 (ref 36–46)
Hemoglobin: 14.5 (ref 12.0–16.0)
Platelets: 182 (ref 150–399)
WBC: 10

## 2019-04-06 LAB — BASIC METABOLIC PANEL
BUN: 25 — AB (ref 4–21)
CO2: 23 — AB (ref 13–22)
Chloride: 101 (ref 99–108)
Creatinine: 1.1 (ref 0.5–1.1)
Glucose: 131
Potassium: 4.1 (ref 3.4–5.3)
Sodium: 142 (ref 137–147)

## 2019-04-06 LAB — CBC: RBC: 4.47 (ref 3.87–5.11)

## 2019-04-07 ENCOUNTER — Other Ambulatory Visit: Payer: Self-pay | Admitting: Cardiovascular Disease

## 2019-04-09 ENCOUNTER — Telehealth: Payer: Self-pay | Admitting: Cardiovascular Disease

## 2019-04-09 NOTE — Telephone Encounter (Signed)
Daughter of the patient called and states that Medicare will not cover her Xarelto any more. The daughter wanted to know if she needed to get an rx for a new medication in its place or if there is any paperwork Dr. Gwenlyn Found would need to file so Medicare will cover the medication.  Either way, the patient only has 3 days worth of medication, and will need to come up with a solution soon. Please let the daughter know what needs to happen.

## 2019-04-09 NOTE — Telephone Encounter (Signed)
xarelto 2.5 unsure of how to dose this will route to the pharm d pool

## 2019-04-13 ENCOUNTER — Ambulatory Visit (HOSPITAL_COMMUNITY)
Admission: RE | Admit: 2019-04-13 | Discharge: 2019-04-13 | Disposition: A | Discharge status: Home / Self Care | Payer: Medicare Other | Source: Ambulatory Visit | Attending: Cardiovascular Disease | Admitting: Cardiovascular Disease

## 2019-04-13 ENCOUNTER — Other Ambulatory Visit: Payer: Self-pay

## 2019-04-13 ENCOUNTER — Telehealth: Payer: Self-pay

## 2019-04-13 ENCOUNTER — Other Ambulatory Visit (HOSPITAL_COMMUNITY): Payer: Self-pay | Admitting: Cardiovascular Disease

## 2019-04-13 DIAGNOSIS — Z95828 Presence of other vascular implants and grafts: Secondary | ICD-10-CM | POA: Insufficient documentation

## 2019-04-13 DIAGNOSIS — I739 Peripheral vascular disease, unspecified: Secondary | ICD-10-CM

## 2019-04-13 NOTE — Telephone Encounter (Signed)
Called pt and LVM letting her know that her PA for Xarelto has been approved.

## 2019-04-18 ENCOUNTER — Encounter: Payer: Self-pay | Admitting: Cardiovascular Disease

## 2019-04-18 ENCOUNTER — Other Ambulatory Visit: Payer: Self-pay

## 2019-04-18 ENCOUNTER — Ambulatory Visit (INDEPENDENT_AMBULATORY_CARE_PROVIDER_SITE_OTHER): Payer: Medicare Other | Admitting: Cardiovascular Disease

## 2019-04-18 VITALS — BP 140/64 | HR 80 | Temp 96.4°F | Ht 61.0 in | Wt 112.0 lb

## 2019-04-18 DIAGNOSIS — I739 Peripheral vascular disease, unspecified: Secondary | ICD-10-CM | POA: Diagnosis not present

## 2019-04-18 NOTE — Progress Notes (Signed)
Anne Pena returns a for follow-up of her lower extremity arterial Doppler studies performed 04/13/2019.  Her ABIs were essentially normal.  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  She is on low-dose Xarelto per the [facility] study which I endorse.  I will see her back in 12 months.  [name], M.D., [place], [facility], [place] [address] [place], [place]  [address]  [phone] 04/18/2019 3:10 PM

## 2019-04-18 NOTE — Patient Instructions (Addendum)
Medication Instructions:  Your physician recommends that you continue on your current medications as directed. Please refer to the Current Medication list given to you today.  If you need a refill on your cardiac medications before your next appointment, please call your pharmacy.   Lab work: NONE  Testing/Procedures: Your physician has requested that you have a lower extremity arterial exercise duplex in one year. During this test, exercise and ultrasound are used to evaluate arterial blood flow in the legs. Allow one hour for this exam. There are no restrictions or special instructions.  AND  Your physician has requested that you have an ankle brachial index (ABI). During this test an ultrasound and blood pressure cuff are used to evaluate the arteries that supply the arms and legs with blood. Allow thirty minutes for this exam. There are no restrictions or special instructions.    Follow-Up: At [facility], you and your health needs are our priority.  As part of our continuing mission to provide you with exceptional heart care, we have created designated Provider Care Teams.  These Care Teams include your primary Cardiologist (physician) and Advanced Practice Providers (APPs -  Physician Assistants and Nurse Practitioners) who all work together to provide you with the care you need, when you need it. You may see Quay Burow, MD or one of the following Advanced Practice Providers on your designated Care Team:    Kerin Ransom, PA-C  [place], [place]  Coletta Memos, [place]  Your physician wants you to follow-up in: in ONE year with Dr. Gwenlyn Found. You will receive a reminder letter in the mail two months in advance. If you don't receive a letter, please call our office to schedule the follow-up appointment.

## 2019-05-02 DIAGNOSIS — M316 Other giant cell arteritis: Secondary | ICD-10-CM | POA: Diagnosis not present

## 2019-05-02 DIAGNOSIS — M0609 Rheumatoid arthritis without rheumatoid factor, multiple sites: Secondary | ICD-10-CM | POA: Diagnosis not present

## 2019-05-09 DIAGNOSIS — M810 Age-related osteoporosis without current pathological fracture: Secondary | ICD-10-CM | POA: Diagnosis not present

## 2019-05-31 DIAGNOSIS — M316 Other giant cell arteritis: Secondary | ICD-10-CM | POA: Diagnosis not present

## 2019-06-19 ENCOUNTER — Other Ambulatory Visit: Payer: Self-pay | Admitting: Physician Assistant

## 2019-06-19 DIAGNOSIS — E1169 Type 2 diabetes mellitus with other specified complication: Secondary | ICD-10-CM

## 2019-06-19 MED ORDER — METFORMIN HCL 500 MG PO TABS: 500.0000 mg | ORAL_TABLET | Freq: Two times a day (BID) | ORAL | 0 refills | Status: DC

## 2019-06-19 NOTE — Telephone Encounter (Signed)
#  30 day supply sent to [facility]. AS, CMA

## 2019-06-19 NOTE — Telephone Encounter (Signed)
Pt's daughter called states Patient has only 5 dys left of :   metFORMIN (GLUCOPHAGE) 500 MG tablet [id number]   Order Details Dose: 500 mg Route: Oral Frequency: 2 times daily with meals  Dispense Quantity: 180 tablet Refills: 1   Note to Pharmacy: Ov needed for future RF      Sig: Take 1 tablet (500 mg total) by mouth 2 (two) times daily with a meal.   ---Advised pt's last OV 12/2018 & scheduled 1st available appt for patient on 07/02/19.  --Forwarding request that if refill granted to send refill order to :     [place], [place] [phone] (Phone) [phone] (Fax)   --pls call pt if there are any questions or concerns @ [phone]  --glh

## 2019-06-28 DIAGNOSIS — M316 Other giant cell arteritis: Secondary | ICD-10-CM | POA: Diagnosis not present

## 2019-06-28 DIAGNOSIS — M0609 Rheumatoid arthritis without rheumatoid factor, multiple sites: Secondary | ICD-10-CM | POA: Diagnosis not present

## 2019-07-02 ENCOUNTER — Ambulatory Visit: Payer: Medicare Other | Admitting: Physician Assistant

## 2019-07-11 ENCOUNTER — Encounter: Payer: Self-pay | Admitting: Nurse Practitioner

## 2019-07-11 ENCOUNTER — Ambulatory Visit (INDEPENDENT_AMBULATORY_CARE_PROVIDER_SITE_OTHER): Payer: Medicare Other | Admitting: Nurse Practitioner

## 2019-07-11 ENCOUNTER — Other Ambulatory Visit: Payer: Self-pay

## 2019-07-11 VITALS — BP 140/80 | HR 81 | Temp 97.3°F | Ht 61.0 in | Wt 110.4 lb

## 2019-07-11 DIAGNOSIS — M316 Other giant cell arteritis: Secondary | ICD-10-CM

## 2019-07-11 DIAGNOSIS — G4700 Insomnia, unspecified: Secondary | ICD-10-CM | POA: Diagnosis not present

## 2019-07-11 DIAGNOSIS — M810 Age-related osteoporosis without current pathological fracture: Secondary | ICD-10-CM | POA: Diagnosis not present

## 2019-07-11 DIAGNOSIS — E782 Mixed hyperlipidemia: Secondary | ICD-10-CM

## 2019-07-11 DIAGNOSIS — F320 Major depressive disorder, single episode, mild: Secondary | ICD-10-CM

## 2019-07-11 DIAGNOSIS — Z95828 Presence of other vascular implants and grafts: Secondary | ICD-10-CM | POA: Diagnosis not present

## 2019-07-11 DIAGNOSIS — E1159 Type 2 diabetes mellitus with other circulatory complications: Secondary | ICD-10-CM

## 2019-07-11 DIAGNOSIS — E039 Hypothyroidism, unspecified: Secondary | ICD-10-CM

## 2019-07-11 DIAGNOSIS — E1169 Type 2 diabetes mellitus with other specified complication: Secondary | ICD-10-CM | POA: Diagnosis not present

## 2019-07-11 DIAGNOSIS — K219 Gastro-esophageal reflux disease without esophagitis: Secondary | ICD-10-CM

## 2019-07-11 DIAGNOSIS — E559 Vitamin D deficiency, unspecified: Secondary | ICD-10-CM

## 2019-07-11 DIAGNOSIS — I25118 Atherosclerotic heart disease of native coronary artery with other forms of angina pectoris: Secondary | ICD-10-CM | POA: Diagnosis not present

## 2019-07-11 DIAGNOSIS — E538 Deficiency of other specified B group vitamins: Secondary | ICD-10-CM

## 2019-07-11 DIAGNOSIS — E038 Other specified hypothyroidism: Secondary | ICD-10-CM

## 2019-07-11 DIAGNOSIS — Z8551 Personal history of malignant neoplasm of bladder: Secondary | ICD-10-CM | POA: Diagnosis not present

## 2019-07-11 DIAGNOSIS — I10 Essential (primary) hypertension: Secondary | ICD-10-CM

## 2019-07-11 LAB — POCT URINALYSIS DIPSTICK
Bilirubin, UA: NEGATIVE
Blood, UA: NEGATIVE
Glucose, UA: NEGATIVE
Ketones, UA: NEGATIVE
Leukocytes, UA: NEGATIVE
Nitrite, UA: NEGATIVE
Protein, UA: NEGATIVE
Spec Grav, UA: 1.02 (ref 1.010–1.025)
Urobilinogen, UA: 0.2 E.U./dL
pH, UA: 5 (ref 5.0–8.0)

## 2019-07-11 MED ORDER — OMEPRAZOLE 20 MG PO CPDR: 20.0000 mg | DELAYED_RELEASE_CAPSULE | Freq: Every day | ORAL | 3 refills | Status: DC

## 2019-07-11 MED ORDER — TRAZODONE HCL 50 MG PO TABS: 50.0000 mg | ORAL_TABLET | Freq: Every day | ORAL | 1 refills | Status: DC

## 2019-07-11 NOTE — Patient Instructions (Addendum)
Omeprazole sent to pharmacy for acid reflux  To start trazodone 50 mg by mouth at bedtime for sleep/mood Can decrease alprazolam to 1/2 tablet for 4 nights then take 1/2 tablet every other night for 4 nights then stop   To schedule AWV -via telephone visit  Follow up in 3 months for new patient follow up

## 2019-07-11 NOTE — Progress Notes (Signed)
Careteam: Patient Care Team: [name], NP as PCP - General (Geriatric Medicine) [name], MD as PCP - Cardiology (Cardiology)  PLACE OF SERVICE:  [place] Directive information Does Patient Have a Medical Advance Directive?: Yes, Type of Advance Directive: Living will, Does patient want to make changes to medical advance directive?: No - Patient declined  Allergies  Allergen Reactions  . Lipitor [Atorvastatin]     rash  . Tricor [Fenofibrate]     rash    Chief Complaint  Patient presents with  . Establish Care    New patient to establish care     HPI: Patient is a 85 y.o. female to establish care.  She moved to [facility] from [place] in March 2020. She lives with her daughter and son-in-law.  Daughter here today   htn- does not check blood pressure at home.  Following with Dr Gwenlyn Found, he felt like blood pressure was appropriate for age. Losartan 100 mg daily, lopressor 25 mg in the am and 12.5 mg in the pm  Insomnia- using alprazolam 0.5 mg daily at bedtime for sleep. (prescribed by rheumatologist) insurance no longer going to pay for medication. Less than 10$ a month.   Aorto-femoral bypass graft- on xarelto- insurance not paying for but discussed with cardiologist and he would like her to remain on xarelto.   Osteoporosis- on cal and vit d twice and prolia every 6 months, unable to walk.   Diabetes- metformin 500 mg twice daily- down from 1000 mg twice daily last visit with her prior PCP, check blood sugar occasionally, ~130 fasting.   Hyperlipidemia- crestor 10 mg daily.  Not fasting today.   GCA- actemra IV monthly, blindness in left eye.   Bladder cancer- previously seen by urologist but he retired. She was living in [place]. Previous urologist retired and not able to get records they monitor for blood in urine.   She previously had a GI- had colonoscopy due to hx of polys and endoscopy  Noted to have hx diverticulosis      GERD- problems with heartburn but sometimes has to eat a little spice. Used ranitidine which was helpful. Pepcid not. Omeprazole was effective.    Review of Systems:  Review of Systems  Constitutional: Positive for weight loss. Negative for chills and fever.  Respiratory: Negative for cough, sputum production and shortness of breath.   Cardiovascular: Negative for chest pain, palpitations and leg swelling.  Gastrointestinal: Positive for heartburn. Negative for abdominal pain, constipation and diarrhea.  Genitourinary: Negative for dysuria, frequency and urgency.  Musculoskeletal: Positive for back pain and joint pain. Negative for falls and myalgias.  Skin: Negative.   Neurological: Negative for dizziness and headaches.  Psychiatric/Behavioral: Positive for memory loss. Negative for depression. The patient is nervous/anxious and has insomnia.     Past Medical History:  Diagnosis Date  . Bladder cancer ([place]) 02/10/2010  . CAD (coronary artery disease)    Coronary intervention [facility] in 2000.  . Diabetes mellitus without complication ([place])   . GCA (giant cell arteritis) ([place])   . Hyperlipidemia   . Hypertension   . Peripheral vascular disease of lower extremity ([place])    aortobifemoral bypass grafting at [facility] in [place] in 2004.   . Presence of stent in left circumflex coronary artery    Past Surgical History:  Procedure Laterality Date  . AORTO-FEMORAL BYPASS GRAFT     aortobifemoral bypass grafting at [facility] in [place] in 2004.   Marland Kitchen  CATARACT EXTRACTION    . HERNIA REPAIR    . KIDNEY SURGERY  04/06/2011  . URETER SURGERY  05/05/2011   Social History:   reports that she quit smoking about 8 years ago. Her smoking use included cigarettes. She has a 64.00 pack-year smoking history. She has never used smokeless tobacco. She reports that she does not drink alcohol or use drugs.  Family History  Problem Relation Age of Onset  . Stroke Mother   . Lung cancer  Father   . Heart attack Brother   . Hyperlipidemia Brother   . Hypertension Brother   . Diabetes Maternal Uncle   . Diabetes Maternal Grandmother     Medications: Patient's Medications  New Prescriptions   OMEPRAZOLE (PRILOSEC) 20 MG CAPSULE    Take 1 capsule (20 mg total) by mouth daily.   TRAZODONE (DESYREL) 50 MG TABLET    Take 1 tablet (50 mg total) by mouth at bedtime.  Previous Medications   ALPRAZOLAM (XANAX) 0.5 MG TABLET    Take 0.5 mg by mouth daily.   B COMPLEX-C-FOLIC ACID (B-COMPLEX/VITAMIN C) TABS    1 tab DAILY   CALCIUM CARBONATE-VITAMIN D (CALCIUM 500/D PO)    Take 500 mg by mouth 2 (two) times daily.   DENOSUMAB (PROLIA) 60 MG/ML SOSY INJECTION    Inject 60 mg into the skin every 6 (six) months.   LOSARTAN (COZAAR) 100 MG TABLET    TAKE 1 TABLET BY MOUTH DAILY. (PRELACES VALSARTAN THAT WAS ON BACKORDER)   METFORMIN (GLUCOPHAGE) 500 MG TABLET    Take 1 tablet (500 mg total) by mouth 2 (two) times daily with a meal. **PATIENT NEEDS APT FOR FURTHER REFILLS**   METOPROLOL TARTRATE (LOPRESSOR) 25 MG TABLET    TAKE 1 TABLET (25 MG TOTAL) BY MOUTH IN THE MORNING AND 1/2 (12.5 MG TOTAL) IN THE EVENING   MULTIPLE VITAMINS-MINERALS (ICAPS AREDS 2) CAPS    Take 2 capsules by mouth daily. Every morning and every evening   OMEGA-3 FATTY ACIDS (FISH OIL) 1000 MG CPDR    Take 1,000 mg by mouth daily.   ROSUVASTATIN (CRESTOR) 10 MG TABLET    Take 1 tablet (10 mg total) by mouth daily.   TOCILIZUMAB (ACTEMRA IV)    Inject into the vein every 30 (thirty) days.   XARELTO 2.5 MG TABS TABLET    TAKE 1 TABLET BY MOUTH 2 TIMES DAILY  Modified Medications   No medications on file  Discontinued Medications   CHOLECALCIFEROL (VITAMIN D) 50 MCG (2000 UT) TABLET    Take 1 tablet (2,000 Units total) by mouth daily.    Physical Exam:  Vitals:   07/11/19 1324  BP: 140/80  Pulse: 81  Temp: (!) 97.3 F (36.3 C)  TempSrc: Temporal  SpO2: 98%  Weight: 110 lb 6.4 oz (50.1 kg)  Height: 5' 1"   (1.549 m)   Body mass index is 20.86 kg/m. Wt Readings from Last 3 Encounters:  07/11/19 110 lb 6.4 oz (50.1 kg)  04/18/19 112 lb (50.8 kg)  12/19/18 113 lb 6.4 oz (51.4 kg)    Physical Exam Constitutional:      General: She is not in acute distress.    Appearance: She is well-developed. She is not diaphoretic.  HENT:     Head: Normocephalic and atraumatic.     Nose: Nose normal.     Mouth/Throat:     Mouth: Mucous membranes are moist.  Eyes:     Conjunctiva/sclera: Conjunctivae normal.  Pupils: Pupils are equal, round, and reactive to light.  Cardiovascular:     Rate and Rhythm: Normal rate and regular rhythm.     Heart sounds: Normal heart sounds.  Pulmonary:     Effort: Pulmonary effort is normal.     Breath sounds: Normal breath sounds.  Abdominal:     General: Bowel sounds are normal.     Palpations: Abdomen is soft.  Musculoskeletal:        General: No tenderness.     Cervical back: Normal range of motion and neck supple.     Right lower leg: No edema.     Left lower leg: No edema.  Skin:    General: Skin is warm and dry.  Neurological:     Mental Status: She is alert and oriented to person, place, and time.  Psychiatric:        Mood and Affect: Mood normal.        Behavior: Behavior normal.     Labs reviewed: Basic Metabolic Panel: Recent Labs    12/27/18 0000  NA 142  K 4.7  CL 104  CO2 20  CREATININE 1.0  CALCIUM 10.0   Liver Function Tests: Recent Labs    12/27/18 0000  AST 18  ALT 20   No results for input(s): LIPASE, AMYLASE in the last 8760 hours. No results for input(s): AMMONIA in the last 8760 hours. CBC: Recent Labs    10/24/18 1056 12/27/18 0000  WBC 6.9 8.0  NEUTROABS 3.9  --   HGB 14.2 14.3  HCT 43.4 41  MCV 96.2  --   PLT 160 162   Lipid Panel: No results for input(s): CHOL, HDL, LDLCALC, TRIG, CHOLHDL, LDLDIRECT in the last 8760 hours. TSH: No results for input(s): TSH in the last 8760 hours. A1C: Lab Results   Component Value Date   HGBA1C 6.5 (A) 12/19/2018     Assessment/Plan .1. Type 2 diabetes mellitus with other specified complication, without long-term current use of insulin (HCC) -continues on metformin 500 mg BID reduced from 1000 mg BID 6 months ago. Does not take blood sugars at home. No hypoglycemia noted.  -she is due for foot exam- will get at follow up.  - Hemoglobin A1c  2. Hx of bladder cancer -no longer seeing urologist, UA to monitor for blood in urine - POC Urinalysis Dipstick- neg for blood  3. Insomnia, unspecified type -stable, pt feels like she needs alprazolam to sleep, discussed risk of adverse drug reaction with alprazolam, agreeable to start trazodone and titrate off alprazolam at this time.  - traZODone (DESYREL) 50 MG tablet; Take 1 tablet (50 mg total) by mouth at bedtime.  Dispense: 30 tablet; Refill: 1  4. Depression Moved in with daughter in the last year with COVID has taken a toll on her mood. Will start trazodone to help with sleep and mood.   5. Mixed hyperlipidemia due to type 2 diabetes mellitus ([place]) -no recent lipid, will follow up today, continues on crestor 10 mg daily with dietary modifications.  - CMP with eGFR([facility]) - Lipid Panel  6. GCA (giant cell arteritis) ([place]) Followed by Dr Trudie Reed, continues on Actemra infusion monthly by rheumatologist, will   7. CAD with stable angina -stable, continues on crestor and xarelto due to her aortobifemoral bypass  8. Hx of aorto-femoral bypass Stable, continues to be monitored by cardiologist, recommended to continue on xarelto for anticoagulation.   9. Age-related osteoporosis without current pathological fracture -continues on prolia through  rheumatologist who updates her dexa scan. Encouraged weight bearing activity along with cal and vit d  10. Vitamin D deficiency -continues on cal with D supplement - Vitamin D, 25-hydroxy  11. Subclinical hypothyroidism -will follow up - TSH  12.  Hypertension associated with diabetes ([place]) -marginally elevated but at goal due to age. Continues to follow up with cardiologist, heart healthy diet.  - CMP with eGFR([facility]) - CBC with Differential/Platelet  13. Gastroesophageal reflux disease without esophagitis -ongoing, has used zantac with good relief but since this has been taken off market needs something else. pepcid was not effective. She continues with dietary modifications.  - omeprazole (PRILOSEC) 20 MG capsule; Take 1 capsule (20 mg total) by mouth daily.  Dispense: 30 capsule; Refill: 3  14. Vitamin B12 deficiency -continue on supplement - Vitamin B12  Next appt: due for AWV- telephone visit 3 month for routine follow up . Carlos American. [place], [facility] [phone]

## 2019-07-12 ENCOUNTER — Other Ambulatory Visit: Payer: Self-pay

## 2019-07-12 DIAGNOSIS — E1169 Type 2 diabetes mellitus with other specified complication: Secondary | ICD-10-CM

## 2019-07-12 MED ORDER — METFORMIN HCL 500 MG PO TABS: 500.0000 mg | ORAL_TABLET | Freq: Two times a day (BID) | ORAL | 1 refills | Status: DC

## 2019-07-13 LAB — VITAMIN B12

## 2019-07-13 LAB — COMPLETE METABOLIC PANEL WITH GFR

## 2019-07-13 LAB — TIQ-MISC

## 2019-07-13 LAB — CBC WITH DIFFERENTIAL/PLATELET

## 2019-07-13 LAB — HEMOGLOBIN A1C
Hgb A1c MFr Bld: 6.5 % of total Hgb — ABNORMAL HIGH (ref <5.7)
Mean Plasma Glucose: 140 (calc)
eAG (mmol/L): 7.7 (calc)

## 2019-07-13 LAB — TSH

## 2019-07-13 LAB — VITAMIN D 25 HYDROXY (VIT D DEFICIENCY, FRACTURES)

## 2019-07-13 LAB — LIPID PANEL

## 2019-07-17 DIAGNOSIS — Z961 Presence of intraocular lens: Secondary | ICD-10-CM | POA: Diagnosis not present

## 2019-07-17 DIAGNOSIS — H353132 Nonexudative age-related macular degeneration, bilateral, intermediate dry stage: Secondary | ICD-10-CM | POA: Diagnosis not present

## 2019-07-17 DIAGNOSIS — H524 Presbyopia: Secondary | ICD-10-CM | POA: Diagnosis not present

## 2019-07-17 LAB — HM DIABETES EYE EXAM

## 2019-07-20 ENCOUNTER — Other Ambulatory Visit: Payer: Self-pay | Admitting: Nurse Practitioner

## 2019-07-20 DIAGNOSIS — E559 Vitamin D deficiency, unspecified: Secondary | ICD-10-CM

## 2019-07-21 LAB — COMPLETE METABOLIC PANEL WITH GFR
AG Ratio: 2.7 (calc) — ABNORMAL HIGH (ref 1.0–2.5)
ALT: 12 U/L (ref 6–29)
AST: 12 U/L (ref 10–35)
Albumin: 4.6 g/dL (ref 3.6–5.1)
Alkaline phosphatase (APISO): 41 U/L (ref 37–153)
BUN/Creatinine Ratio: 23 (calc) — ABNORMAL HIGH (ref 6–22)
BUN: 22 mg/dL (ref 7–25)
CO2: 28 mmol/L (ref 20–32)
Calcium: 9.7 mg/dL (ref 8.6–10.4)
Chloride: 105 mmol/L (ref 98–110)
Creat: 0.96 mg/dL — ABNORMAL HIGH (ref 0.60–0.88)
GFR, Est African American: 64 mL/min/{1.73_m2} (ref >=60)
GFR, Est Non African American: 55 mL/min/{1.73_m2} — ABNORMAL LOW (ref >=60)
Globulin: 1.7 g/dL (calc) — ABNORMAL LOW (ref 1.9–3.7)
Glucose, Bld: 88 mg/dL (ref 65–99)
Potassium: 4.5 mmol/L (ref 3.5–5.3)
Sodium: 141 mmol/L (ref 135–146)
Total Bilirubin: 0.8 mg/dL (ref 0.2–1.2)
Total Protein: 6.3 g/dL (ref 6.1–8.1)

## 2019-07-21 LAB — CBC WITH DIFFERENTIAL/PLATELET
Absolute Monocytes: 808 cells/uL (ref 200–950)
Basophils Absolute: 26 cells/uL (ref 0–200)
Basophils Relative: 0.3 %
Eosinophils Absolute: 163 cells/uL (ref 15–500)
Eosinophils Relative: 1.9 %
HCT: 39.5 % (ref 35.0–45.0)
Hemoglobin: 13.5 g/dL (ref 11.7–15.5)
Lymphs Abs: 1935 cells/uL (ref 850–3900)
MCH: 32.2 pg (ref 27.0–33.0)
MCHC: 34.2 g/dL (ref 32.0–36.0)
MCV: 94.3 fL (ref 80.0–100.0)
MPV: 11.5 fL (ref 7.5–12.5)
Monocytes Relative: 9.4 %
Neutro Abs: 5667 cells/uL (ref 1500–7800)
Neutrophils Relative %: 65.9 %
Platelets: 153 10*3/uL (ref 140–400)
RBC: 4.19 10*6/uL (ref 3.80–5.10)
RDW: 12.3 % (ref 11.0–15.0)
Total Lymphocyte: 22.5 %
WBC: 8.6 10*3/uL (ref 3.8–10.8)

## 2019-07-21 LAB — LIPID PANEL
Cholesterol: 157 mg/dL (ref <200)
HDL: 41 mg/dL — ABNORMAL LOW (ref >=50)
LDL Cholesterol (Calc): 74 mg/dL (calc)
Non-HDL Cholesterol (Calc): 116 mg/dL (calc) (ref <130)
Total CHOL/HDL Ratio: 3.8 (calc) (ref <5.0)
Triglycerides: 345 mg/dL — ABNORMAL HIGH (ref <150)

## 2019-07-21 LAB — VITAMIN B12: Vitamin B-12: 392 pg/mL (ref 200–1100)

## 2019-07-21 LAB — TSH: TSH: 3.51 mIU/L (ref 0.40–4.50)

## 2019-07-21 LAB — VITAMIN D 25 HYDROXY (VIT D DEFICIENCY, FRACTURES): Vit D, 25-Hydroxy: 29 ng/mL — ABNORMAL LOW (ref 30–100)

## 2019-07-24 MED ORDER — VITAMIN D (ERGOCALCIFEROL) 1.25 MG (50000 UNIT) PO CAPS: 50000.0000 [IU] | ORAL_CAPSULE | ORAL | 1 refills | Status: DC

## 2019-07-24 NOTE — Addendum Note (Signed)
Addended by: [name] on: 07/24/2019 03:39 PM   Modules accepted: Orders

## 2019-07-26 DIAGNOSIS — M0609 Rheumatoid arthritis without rheumatoid factor, multiple sites: Secondary | ICD-10-CM | POA: Diagnosis not present

## 2019-07-26 DIAGNOSIS — M316 Other giant cell arteritis: Secondary | ICD-10-CM | POA: Diagnosis not present

## 2019-08-10 ENCOUNTER — Encounter: Payer: Medicare Other | Admitting: Nurse Practitioner

## 2019-08-28 ENCOUNTER — Ambulatory Visit: Payer: Medicare Other | Admitting: Nurse Practitioner

## 2019-08-28 DIAGNOSIS — Z13220 Encounter for screening for lipoid disorders: Secondary | ICD-10-CM | POA: Diagnosis not present

## 2019-08-28 DIAGNOSIS — M316 Other giant cell arteritis: Secondary | ICD-10-CM | POA: Diagnosis not present

## 2019-08-28 DIAGNOSIS — Z79899 Other long term (current) drug therapy: Secondary | ICD-10-CM | POA: Diagnosis not present

## 2019-08-28 LAB — LIPID PANEL
Cholesterol: 167 (ref 0–200)
HDL: 37 (ref 35–70)
LDL Cholesterol: 74
Triglycerides: 347 — AB (ref 40–160)

## 2019-09-03 ENCOUNTER — Ambulatory Visit (INDEPENDENT_AMBULATORY_CARE_PROVIDER_SITE_OTHER): Payer: Medicare Other | Admitting: Nurse Practitioner

## 2019-09-03 ENCOUNTER — Encounter: Payer: Self-pay | Admitting: Nurse Practitioner

## 2019-09-03 ENCOUNTER — Other Ambulatory Visit: Payer: Self-pay

## 2019-09-03 VITALS — BP 118/76 | HR 74 | Temp 96.9°F | Ht 61.0 in | Wt 108.0 lb

## 2019-09-03 DIAGNOSIS — R630 Anorexia: Secondary | ICD-10-CM

## 2019-09-03 DIAGNOSIS — I25118 Atherosclerotic heart disease of native coronary artery with other forms of angina pectoris: Secondary | ICD-10-CM

## 2019-09-03 DIAGNOSIS — R42 Dizziness and giddiness: Secondary | ICD-10-CM

## 2019-09-03 DIAGNOSIS — G4700 Insomnia, unspecified: Secondary | ICD-10-CM | POA: Diagnosis not present

## 2019-09-03 MED ORDER — MIRTAZAPINE 7.5 MG PO TABS: 7.5000 mg | ORAL_TABLET | Freq: Every day | ORAL | 1 refills | Status: DC

## 2019-09-03 NOTE — Patient Instructions (Signed)
STOP TRAZODONE  Start remeron tonight (only give xanax if unable to sleep without it)

## 2019-09-03 NOTE — Progress Notes (Signed)
Careteam: Patient Care Team: [name], NP as PCP - General (Geriatric Medicine) [name], MD as PCP - Cardiology (Cardiology)  PLACE OF SERVICE:  [place] Directive information    Allergies  Allergen Reactions   Benadryl [Diphenhydramine]     Side effects per records received from [facility] Rheumatology    Lipitor [Atorvastatin]     rash   Tricor [Fenofibrate]     rash    Chief Complaint  Patient presents with   Acute Visit    Dizziness. Here with daughter Silvestre Gunner    Had eye exam in June 2021, had a good report per patient     HPI: Patient is a 85 y.o. female due to dizziness.   Went about 4 days without dizziness. Reports she quit reading paper in the morning and not on her computer/phone as much but feels like the dizziness is worse when she is on her phone/reading. Feeling increase in fatigue.   Daughter was watching her water intake- over the last 7-8 days. TOTAL 114 oz for 7-8 days Reports she does not eat much. She has lost weight since last visit. Reports decrease in appetite.   Dizziness was gone while she went away but when she came home dizziness got worse.  She was eating 3 full meals when she was away from home but does not eat like that at home. She went out to eat often with her friend.   She could not stop xanax but has cut back to half tablet. She is titration off trazodone because there was no difference noted. Thought that might help the dizziness, no changes in regards to that  Reports Does not feel the dizziness is due to vertigo or room spinning but has the sensation she may fall. Will walk sideways. Happens when she gets up too fast or turns her head. This has been off and on throughout the years, old doctor recommended antivertt before due to vertigo but did not help.  No nausea or vomiting   Review of Systems:  Review of Systems  Constitutional: Positive for malaise/fatigue and weight loss. Negative  for chills and fever.  Respiratory: Negative for cough and shortness of breath.   Cardiovascular: Negative for chest pain and leg swelling.  Neurological: Positive for dizziness and headaches.  Psychiatric/Behavioral: The patient is nervous/anxious and has insomnia.     Past Medical History:  Diagnosis Date   Atherosclerosis of aorta [facility])    s/p AO bypass 2004, Per records received [facility]    Bladder cancer [facility]) 02/10/2010   CAD (coronary artery disease)    Coronary intervention [facility] in 2000.   Diabetes mellitus without complication ([place])    Former tobacco use    Per records received from [place] (giant cell arteritis) ([place])    Giant cell arteritis ([place])    Per records received from [facility] Rheumatology    Hyperlipidemia    Hypertension    Osteoporosis    Prolia injection administered on 05/09/2019, Per records received from [facility] Rheumatology    PAD (peripheral artery disease) [facility])    s/p bilateral iliac-femoral artery bypass 2000, Per records received from [facility] Rheumatology    Peripheral vascular disease of lower extremity ([place])    aortobifemoral bypass grafting at [facility] in [place] in 2004.    Presence of stent in left circumflex coronary artery    Rheumatoid arthritis ([place])    Per  records received from [facility] Rheumatology    Past Surgical History:  Procedure Laterality Date   AORTO-FEMORAL BYPASS GRAFT     aortobifemoral bypass grafting at [facility] in [place] in 2004.    CATARACT EXTRACTION     HERNIA REPAIR     KIDNEY SURGERY  04/06/2011   URETER SURGERY  05/05/2011   Social History:   reports that she quit smoking about 8 years ago. Her smoking use included cigarettes. She has a 64.00 pack-year smoking history. She has never used smokeless tobacco. She reports current alcohol use. She reports that she does not use drugs.  Family History  Problem Relation Age of Onset    Stroke Mother    Hypertension Mother        Per records received from [facility] Rheumatology    Lung cancer Father    Heart attack Brother    Hyperlipidemia Brother    Hypertension Brother    Diabetes Maternal Uncle    Diabetes Maternal Grandmother    Hypertension Son        Per records received from [place] Rheumatology    Medications: Patient's Medications  New Prescriptions   No medications on file  Previous Medications   ALPRAZOLAM (XANAX) 0.5 MG TABLET    Take 0.5 mg by mouth daily.   B COMPLEX VITAMINS TABLET    Take 1 tablet by mouth daily.   CALCIUM CARBONATE (CALCIUM 500 PO)    Take 1 tablet by mouth in the morning and at bedtime. Only 2 weeks prior to Prolia injection and 2 weeks after   DENOSUMAB (PROLIA) 60 MG/ML SOSY INJECTION    Inject 60 mg into the skin every 6 (six) months.   LOSARTAN (COZAAR) 100 MG TABLET    TAKE 1 TABLET BY MOUTH DAILY. (PRELACES VALSARTAN THAT WAS ON BACKORDER)   METFORMIN (GLUCOPHAGE) 500 MG TABLET    Take 1 tablet (500 mg total) by mouth 2 (two) times daily with a meal.   METOPROLOL TARTRATE (LOPRESSOR) 25 MG TABLET    TAKE 1 TABLET (25 MG TOTAL) BY MOUTH IN THE MORNING AND 1/2 (12.5 MG TOTAL) IN THE EVENING   MULTIPLE VITAMINS-MINERALS (ICAPS AREDS 2) CAPS    Take 2 capsules by mouth daily. Every morning and every evening   OMEPRAZOLE (PRILOSEC) 20 MG CAPSULE    Take 1 capsule (20 mg total) by mouth daily.   ROSUVASTATIN (CRESTOR) 10 MG TABLET    Take 1 tablet (10 mg total) by mouth daily.   TOCILIZUMAB (ACTEMRA IV)    Inject into the vein every 30 (thirty) days.   VITAMIN D, ERGOCALCIFEROL, (DRISDOL) 1.25 MG (50000 UNIT) CAPS CAPSULE    Take 1 capsule (50,000 Units total) by mouth every 7 (seven) days.   XARELTO 2.5 MG TABS TABLET    TAKE 1 TABLET BY MOUTH 2 TIMES DAILY  Modified Medications   No medications on file  Discontinued Medications   B COMPLEX-C-FOLIC ACID (B-COMPLEX/VITAMIN C) TABS    1 tab DAILY   CALCIUM  CARBONATE-VITAMIN D (CALCIUM 500/D PO)    Take 500 mg by mouth 2 (two) times daily.   OMEGA-3 FATTY ACIDS (FISH OIL) 1000 MG CPDR    Take 1,000 mg by mouth daily.   TRAZODONE (DESYREL) 50 MG TABLET    Take 1 tablet (50 mg total) by mouth at bedtime.    Physical Exam:  Vitals:   09/03/19 1134 09/03/19 1218  BP: 118/76   Pulse: 74   Temp: (!) 96.9  F (36.1 C)   TempSrc: Temporal   SpO2: 98% 98%  Weight: 108 lb (49 kg)   Height: 5\' 1"  (1.549 m)    Body mass index is 20.41 kg/m. Wt Readings from Last 3 Encounters:  09/03/19 108 lb (49 kg)  07/11/19 110 lb 6.4 oz (50.1 kg)  04/18/19 112 lb (50.8 kg)    Physical Exam Constitutional:      General: She is not in acute distress.    Appearance: She is well-developed. She is not diaphoretic.  HENT:     Head: Normocephalic and atraumatic.     Mouth/Throat:     Pharynx: No oropharyngeal exudate.  Eyes:     Extraocular Movements: Extraocular movements intact.     Conjunctiva/sclera: Conjunctivae normal.     Pupils: Pupils are equal, round, and reactive to light.  Cardiovascular:     Rate and Rhythm: Normal rate and regular rhythm.     Heart sounds: Normal heart sounds.  Pulmonary:     Effort: Pulmonary effort is normal.     Breath sounds: Normal breath sounds.  Abdominal:     General: Bowel sounds are normal.     Palpations: Abdomen is soft.  Musculoskeletal:        General: No tenderness.     Cervical back: Normal range of motion and neck supple.  Skin:    General: Skin is warm and dry.  Neurological:     Mental Status: She is alert and oriented to person, place, and time.  Psychiatric:        Mood and Affect: Mood normal.        Behavior: Behavior normal.     Labs reviewed: Basic Metabolic Panel: Recent Labs    12/27/18 0000 04/06/19 0000 07/11/19 1424 07/20/19 1404  NA 142 142  --  141  K 4.7 4.1  --  4.5  CL 104 101  --  105  CO2 20 23*  --  28  GLUCOSE  --   --  CANCELED 88  BUN  --  25*  --  22    CREATININE 1.0 1.1  --  0.96*  CALCIUM 10.0  --   --  9.7  TSH  --   --  CANCELED 3.51   Liver Function Tests: Recent Labs    12/27/18 0000 04/06/19 0000 07/20/19 1404  AST 18 17 12   ALT 20 15 12   ALKPHOS  --  57  --   BILITOT  --   --  0.8  PROT  --   --  6.3   No results for input(s): LIPASE, AMYLASE in the last 8760 hours. No results for input(s): AMMONIA in the last 8760 hours. CBC: Recent Labs    10/24/18 1056 10/24/18 1056 12/27/18 0000 12/27/18 0000 04/06/19 0000 07/11/19 1424 07/20/19 1404  WBC 6.9  --  8.0   < > 10.0 CANCELED 8.6  NEUTROABS 3.9  --   --   --   --   --  5,667  HGB 14.2   < > 14.3  --  14.5  --  13.5  HCT 43.4   < > 41  --  40  --  39.5  MCV 96.2  --   --   --   --   --  94.3  PLT 160   < > 162  --  182  --  153   < > = values in this interval not displayed.   Lipid Panel: Recent Labs  07/11/19 1424 07/20/19 1404  CHOL  --  157  HDL  --  41*  LDLCALC CANCELED 74  TRIG  --  345*  CHOLHDL  --  3.8   TSH: Recent Labs    07/11/19 1424 07/20/19 1404  TSH CANCELED 3.51   A1C: Lab Results  Component Value Date   HGBA1C 6.5 (H) 07/11/2019     Assessment/Plan 1. Insomnia, unspecified type To stop trazodone. Only use xanax if unable to sleep on remeron. -continue sleep routine - mirtazapine (REMERON) 7.5 MG tablet; Take 1 tablet (7.5 mg total) by mouth at bedtime.  Dispense: 30 tablet; Refill: 1  2. Dizziness Ongoing dizziness on and off for several years. Suspect BPPV but also sounds like intake is contributing. Encouraged to continue to work on eating 3 meals a day with good protein, increasing water. To change positions slowly. No orthostatic hypotension noted.  3. Loss of appetite - mirtazapine (REMERON) 7.5 MG tablet; Take 1 tablet (7.5 mg total) by mouth at bedtime.  Dispense: 30 tablet; Refill: 1  Next appt: 09/04/2019 for AWV via telephone visit  Marnesha Gagen K. [place], [place] [facility] [phone]

## 2019-09-04 ENCOUNTER — Ambulatory Visit (INDEPENDENT_AMBULATORY_CARE_PROVIDER_SITE_OTHER): Payer: Medicare Other | Admitting: Nurse Practitioner

## 2019-09-04 ENCOUNTER — Encounter: Payer: Self-pay | Admitting: Nurse Practitioner

## 2019-09-04 ENCOUNTER — Telehealth: Payer: Self-pay

## 2019-09-04 DIAGNOSIS — Z0000 Encounter for general adult medical examination without abnormal findings: Secondary | ICD-10-CM | POA: Diagnosis not present

## 2019-09-04 NOTE — Patient Instructions (Signed)
Anne Pena , Thank you for taking time to come for your Medicare Wellness Visit. I appreciate your ongoing commitment to your health goals. Please review the following plan we discussed and let me know if I can assist you in the future.   Screening recommendations/referrals: Colonoscopy aged out Mammogram aged out Bone Density ordered through rheumatology  Recommended yearly ophthalmology/optometry visit for glaucoma screening and checkup Recommended yearly dental visit for hygiene and checkup  Vaccinations: Influenza vaccine up to date Pneumococcal vaccine recommended to complete and have records faxed to office Tdap vaccine RECOMMENDED, to get at local pharmacy Shingles vaccine RECOMMENDED to complete vaccine series     Advanced directives: recommended to look over MOST form, we can complete in office at your next appt.   Conditions/risks identified: cardiovascular disease, weight loss, advance age.   Next appointment:  1 year for AWV    Preventive Care 50 Years and Older, Female Preventive care refers to lifestyle choices and visits with your health care provider that can promote health and wellness. What does preventive care include?  A yearly physical exam. This is also called an annual well check.  Dental exams once or twice a year.  Routine eye exams. Ask your health care provider how often you should have your eyes checked.  Personal lifestyle choices, including:  Daily care of your teeth and gums.  Regular physical activity.  Eating a healthy diet.  Avoiding tobacco and drug use.  Limiting alcohol use.  Practicing safe sex.  Taking low-dose aspirin every day.  Taking vitamin and mineral supplements as recommended by your health care provider. What happens during an annual well check? The services and screenings done by your health care provider during your annual well check will depend on your age, overall health, lifestyle risk factors, and family history  of disease. Counseling  Your health care provider may ask you questions about your:  Alcohol use.  Tobacco use.  Drug use.  Emotional well-being.  Home and relationship well-being.  Sexual activity.  Eating habits.  History of falls.  Memory and ability to understand (cognition).  Work and work Statistician.  Reproductive health. Screening  You may have the following tests or measurements:  Height, weight, and BMI.  Blood pressure.  Lipid and cholesterol levels. These may be checked every 5 years, or more frequently if you are over 7 years old.  Skin check.  Lung cancer screening. You may have this screening every year starting at age 13 if you have a 30-pack-year history of smoking and currently smoke or have quit within the past 15 years.  Fecal occult blood test (FOBT) of the stool. You may have this test every year starting at age 60.  Flexible sigmoidoscopy or colonoscopy. You may have a sigmoidoscopy every 5 years or a colonoscopy every 10 years starting at age 40.  Hepatitis C blood test.  Hepatitis B blood test.  Sexually transmitted disease (STD) testing.  Diabetes screening. This is done by checking your blood sugar (glucose) after you have not eaten for a while (fasting). You may have this done every 1-3 years.  Bone density scan. This is done to screen for osteoporosis. You may have this done starting at age 8.  Mammogram. This may be done every 1-2 years. Talk to your health care provider about how often you should have regular mammograms. Talk with your health care provider about your test results, treatment options, and if necessary, the need for more tests. Vaccines  Your health  care provider may recommend certain vaccines, such as:  Influenza vaccine. This is recommended every year.  Tetanus, diphtheria, and acellular pertussis (Tdap, Td) vaccine. You may need a Td booster every 10 years.  Zoster vaccine. You may need this after age  81.  Pneumococcal 13-valent conjugate (PCV13) vaccine. One dose is recommended after age 1.  Pneumococcal polysaccharide (PPSV23) vaccine. One dose is recommended after age 45. Talk to your health care provider about which screenings and vaccines you need and how often you need them. This information is not intended to replace advice given to you by your health care provider. Make sure you discuss any questions you have with your health care provider. Document Released: 02/21/2015 Document Revised: 10/15/2015 Document Reviewed: 11/26/2014 Elsevier Interactive Patient Education  2017 [place] Prevention in the Home Falls can cause injuries. They can happen to people of all ages. There are many things you can do to make your home safe and to help prevent falls. What can I do on the outside of my home?  Regularly fix the edges of walkways and driveways and fix any cracks.  Remove anything that might make you trip as you walk through a door, such as a raised step or threshold.  Trim any bushes or trees on the path to your home.  Use bright outdoor lighting.  Clear any walking paths of anything that might make someone trip, such as rocks or tools.  Regularly check to see if handrails are loose or broken. Make sure that both sides of any steps have handrails.  Any raised decks and porches should have guardrails on the edges.  Have any leaves, snow, or ice cleared regularly.  Use sand or salt on walking paths during winter.  Clean up any spills in your garage right away. This includes oil or grease spills. What can I do in the bathroom?  Use night lights.  Install grab bars by the toilet and in the tub and shower. Do not use towel bars as grab bars.  Use non-skid mats or decals in the tub or shower.  If you need to sit down in the shower, use a plastic, non-slip stool.  Keep the floor dry. Clean up any water that spills on the floor as soon as it happens.  Remove  soap buildup in the tub or shower regularly.  Attach bath mats securely with double-sided non-slip rug tape.  Do not have throw rugs and other things on the floor that can make you trip. What can I do in the bedroom?  Use night lights.  Make sure that you have a light by your bed that is easy to reach.  Do not use any sheets or blankets that are too big for your bed. They should not hang down onto the floor.  Have a firm chair that has side arms. You can use this for support while you get dressed.  Do not have throw rugs and other things on the floor that can make you trip. What can I do in the kitchen?  Clean up any spills right away.  Avoid walking on wet floors.  Keep items that you use a lot in easy-to-reach places.  If you need to reach something above you, use a strong step stool that has a grab bar.  Keep electrical cords out of the way.  Do not use floor polish or wax that makes floors slippery. If you must use wax, use non-skid floor wax.  Do not have  throw rugs and other things on the floor that can make you trip. What can I do with my stairs?  Do not leave any items on the stairs.  Make sure that there are handrails on both sides of the stairs and use them. Fix handrails that are broken or loose. Make sure that handrails are as long as the stairways.  Check any carpeting to make sure that it is firmly attached to the stairs. Fix any carpet that is loose or worn.  Avoid having throw rugs at the top or bottom of the stairs. If you do have throw rugs, attach them to the floor with carpet tape.  Make sure that you have a light switch at the top of the stairs and the bottom of the stairs. If you do not have them, ask someone to add them for you. What else can I do to help prevent falls?  Wear shoes that:  Do not have high heels.  Have rubber bottoms.  Are comfortable and fit you well.  Are closed at the toe. Do not wear sandals.  If you use a  stepladder:  Make sure that it is fully opened. Do not climb a closed stepladder.  Make sure that both sides of the stepladder are locked into place.  Ask someone to hold it for you, if possible.  Clearly mark and make sure that you can see:  Any grab bars or handrails.  First and last steps.  Where the edge of each step is.  Use tools that help you move around (mobility aids) if they are needed. These include:  Canes.  Walkers.  Scooters.  Crutches.  Turn on the lights when you go into a dark area. Replace any light bulbs as soon as they burn out.  Set up your furniture so you have a clear path. Avoid moving your furniture around.  If any of your floors are uneven, fix them.  If there are any pets around you, be aware of where they are.  Review your medicines with your doctor. Some medicines can make you feel dizzy. This can increase your chance of falling. Ask your doctor what other things that you can do to help prevent falls. This information is not intended to replace advice given to you by your health care provider. Make sure you discuss any questions you have with your health care provider. Document Released: 11/21/2008 Document Revised: 07/03/2015 Document Reviewed: 03/01/2014 Elsevier Interactive Patient Education  2017 [employer].

## 2019-09-04 NOTE — Telephone Encounter (Signed)
Anne Pena, Anne Pena are scheduled for a virtual visit with your provider today.    Just as we do with appointments in the office, we must obtain your consent to participate.  Your consent will be active for this visit and any virtual visit you may have with one of our providers in the next 365 days.    If you have a MyChart account, I can also send a copy of this consent to you electronically.  All virtual visits are billed to your insurance company just like a traditional visit in the office.  As this is a virtual visit, video technology does not allow for your provider to perform a traditional examination.  This may limit your provider's ability to fully assess your condition.  If your provider identifies any concerns that need to be evaluated in person or the need to arrange testing such as labs, EKG, etc, we will make arrangements to do so.    Although advances in technology are sophisticated, we cannot ensure that it will always work on either your end or our end.  If the connection with a video visit is poor, we may have to switch to a telephone visit.  With either a video or telephone visit, we are not always able to ensure that we have a secure connection.   I need to obtain your verbal consent now.   Are you willing to proceed with your visit today?   Anne Pena has provided verbal consent on 09/04/2019 for a virtual visit (video or telephone).   Carroll Kinds, CMA 09/04/2019  11:31 AM

## 2019-09-04 NOTE — Progress Notes (Signed)
This service is provided via telemedicine  No vital signs collected/recorded due to the encounter was a telemedicine visit.   Location of patient (ex: home, work):  Home  Patient consents to a telephone visit:  Yes, see encounter dated 09/04/2019  Location of the provider (ex: office, home):  [place]  Name of any referring provider:  N/A  Names of all persons participating in the telemedicine service and their role in the encounter:  Sherrie Mustache, Nurse Practitioner, Carroll Kinds, CMA, and patient.   Time spent on call:  7 minutes with medical assistant

## 2019-09-04 NOTE — Progress Notes (Signed)
Subjective:   Anne Pena is a 85 y.o. female who presents for Medicare Annual (Subsequent) preventive examination.  Review of Systems     Cardiac Risk Factors include: advanced age (>83men, >37 women);diabetes mellitus;dyslipidemia;hypertension;family history of premature cardiovascular disease;sedentary lifestyle     Objective:    Today's Vitals   09/04/19 1143  PainSc: 2    There is no height or weight on file to calculate BMI.  Advanced Directives 09/04/2019 07/11/2019  Does Patient Have a Medical Advance Directive? Yes Yes  Type of Advance Directive Living will Living will  Does patient want to make changes to medical advance directive? No - Patient declined No - Patient declined    Current Medications (verified) Outpatient Encounter Medications as of 09/04/2019  Medication Sig  . ALPRAZolam (XANAX) 0.5 MG tablet Take 0.5 mg by mouth daily. As needed  . b complex vitamins tablet Take 1 tablet by mouth daily.  . Calcium Carbonate (CALCIUM 500 PO) Take 1 tablet by mouth in the morning and at bedtime. Only 2 weeks prior to Prolia injection and 2 weeks after  . denosumab (PROLIA) 60 MG/ML SOSY injection Inject 60 mg into the skin every 6 (six) months.  Marland Kitchen losartan (COZAAR) 100 MG tablet TAKE 1 TABLET BY MOUTH DAILY. (PRELACES VALSARTAN THAT WAS ON BACKORDER)  . metFORMIN (GLUCOPHAGE) 500 MG tablet Take 1 tablet (500 mg total) by mouth 2 (two) times daily with a meal.  . metoprolol tartrate (LOPRESSOR) 25 MG tablet TAKE 1 TABLET (25 MG TOTAL) BY MOUTH IN THE MORNING AND 1/2 (12.5 MG TOTAL) IN THE EVENING  . mirtazapine (REMERON) 7.5 MG tablet Take 1 tablet (7.5 mg total) by mouth at bedtime.  . Multiple Vitamins-Minerals (ICAPS AREDS 2) CAPS Take 2 capsules by mouth daily. Every morning and every evening  . omeprazole (PRILOSEC) 20 MG capsule Take 1 capsule (20 mg total) by mouth daily.  . rosuvastatin (CRESTOR) 10 MG tablet Take 1 tablet (10 mg total) by mouth daily.  .  Tocilizumab (ACTEMRA IV) Inject into the vein every 30 (thirty) days.  . Vitamin D, Ergocalciferol, (DRISDOL) 1.25 MG (50000 UNIT) CAPS capsule Take 1 capsule (50,000 Units total) by mouth every 7 (seven) days.  Alveda Reasons 2.5 MG TABS tablet TAKE 1 TABLET BY MOUTH 2 TIMES DAILY   No facility-administered encounter medications on file as of 09/04/2019.    Allergies (verified) Benadryl [diphenhydramine], Lipitor [atorvastatin], and Tricor [fenofibrate]   History: Past Medical History:  Diagnosis Date  . Atherosclerosis of aorta [facility])    s/p AO bypass 2004, Per records received from [facility] Rheumatology   . Bladder cancer ([place]) 02/10/2010  . CAD (coronary artery disease)    Coronary intervention [facility] in 2000.  . Diabetes mellitus without complication ([place])   . Former tobacco use    Per records received from [facility]ology   . GCA (giant cell arteritis) ([place])   . Giant cell arteritis [facility])    Per records received from [facility]   . Hyperlipidemia   . Hypertension   . Osteoporosis    Prolia injection administered on 05/09/2019, Per records received from [facility]   . PAD (peripheral artery disease) ([facility])    s/p bilateral iliac-femoral artery bypass 2000, Per records received from [facility] Rhe[facility]   . Peripheral vascular disease of lower extremity ([place])    aortobifemoral bypass grafting at [facility] in [place] in 2004.   . Presence of stent in left circumflex coronary artery   . Rheumatoid  arthritis [facility])    Per records received from [facility] Rheumatology    Past Surgical History:  Procedure Laterality Date  . AORTO-FEMORAL BYPASS GRAFT     aortobifemoral bypass grafting at [facility] in [place] in 2004.   Marland Kitchen CATARACT EXTRACTION    . HERNIA REPAIR    . KIDNEY SURGERY  04/06/2011  . URETER SURGERY  05/05/2011   Family History  Problem Relation Age of Onset  . Stroke Mother   . Hypertension Mother        Per records  received from [facility] Rheumatology   . Lung cancer Father   . Heart attack Brother   . Hyperlipidemia Brother   . Hypertension Brother   . Diabetes Maternal Uncle   . Diabetes Maternal Grandmother   . Hypertension Son        Per records received from [place] History   Socioeconomic History  . Marital status: Widowed    Spouse name: Not on file  . Number of children: Not on file  . Years of education: Not on file  . Highest education level: Not on file  Occupational History  . Not on file  Tobacco Use  . Smoking status: Former Smoker    Packs/day: 1.00    Years: 64.00    Pack years: 64.00    Types: Cigarettes    Quit date: 04/06/2011    Years since quitting: 8.4  . Smokeless tobacco: Never Used  Vaping Use  . Vaping Use: Never used  Substance and Sexual Activity  . Alcohol use: Yes    Comment: Rarely   . Drug use: Never  . Sexual activity: Not Currently  Other Topics Concern  . Not on file  Social History Narrative  . Not on file   Social Determinants of Health   Financial Resource Strain:   . Difficulty of Paying Living Expenses:   Food Insecurity:   . Worried About Charity fundraiser in the Last Year:   . Arboriculturist in the Last Year:   Transportation Needs:   . Film/video editor (Medical):   Marland Kitchen Lack of Transportation (Non-Medical):   Physical Activity:   . Days of Exercise per Week:   . Minutes of Exercise per Session:   Stress:   . Feeling of Stress :   Social Connections:   . Frequency of Communication with Friends and Family:   . Frequency of Social Gatherings with Friends and Family:   . Attends Religious Services:   . Active Member of Clubs or Organizations:   . Attends Archivist Meetings:   Marland Kitchen Marital Status:     Tobacco Counseling Counseling given: Not Answered   Clinical Intake:  Pre-visit preparation completed: Yes  Pain : 0-10 Pain Score: 2  Pain Type: Acute pain Pain Location:  Head Pain Orientation: Posterior Pain Descriptors / Indicators: Aching, Dull Pain Onset: More than a month ago Pain Frequency: Intermittent     BMI - recorded: 20.41 Nutritional Status: BMI of 19-24  Normal Nutritional Risks: Unintentional weight loss Diabetes: Yes CBG done?: No Did pt. bring in CBG monitor from home?: No  How often do you need to have someone help you when you read instructions, pamphlets, or other written materials from your doctor or pharmacy?: 3 - Sometimes  Diabetic?yes         Activities of Daily Living In your present state of health, do you have any difficulty performing the following activities: 09/04/2019  12/19/2018  Hearing? N N  Vision? Y Y  Difficulty concentrating or making decisions? Tempie Donning  Walking or climbing stairs? N Y  Dressing or bathing? N N  Doing errands, shopping? Aggie Moats  Comment daughter helps -  Conservation officer, nature and eating ? N -  Using the Toilet? N -  In the past six months, have you accidently leaked urine? Y -  Do you have problems with loss of bowel control? N -  Managing your Medications? N -  Managing your Finances? N -  Housekeeping or managing your Housekeeping? N -  Some recent data might be hidden    Patient Care Team: [name], NP as PCP - General (Geriatric Medicine) [name], MD as PCP - Cardiology (Cardiology)  Indicate any recent Medical Services you may have received from other than [facility] providers in the past year (date may be approximate).     Assessment:   This is a routine wellness examination for [place].  Hearing/Vision screen  Hearing Screening   125Hz  250Hz  500Hz  1000Hz  2000Hz  3000Hz  4000Hz  6000Hz  8000Hz   Right ear:           Left ear:           Comments: Patient states she has no problems with hearing.  Vision Screening Comments: Patient has loss of vision in one eye.  Dietary issues and exercise activities discussed: Current Exercise Habits: Home exercise routine, Type of  exercise: calisthenics;strength training/weights;stretching, Time (Minutes): 25, Frequency (Times/Week): 7, Weekly Exercise (Minutes/Week): 175  Goals    . Patient Stated     To have enough balance to play 9 holes of golf.       Depression Screen PHQ 2/9 Scores 09/04/2019 07/11/2019 12/19/2018 12/08/2017 12/08/2017  PHQ - 2 Score 0 0 2 1 1   PHQ- 9 Score - - 3 5 -    Fall Risk Fall Risk  09/04/2019 07/11/2019 12/19/2018 12/08/2017  Falls in the past year? 0 1 1 Yes  Number falls in past yr: 0 1 1 2  or more  Injury with Fall? 0 0 0 Yes  Risk Factor Category  - - - High Fall Risk  Risk for fall due to : - History of fall(s) - -  Follow up - - Falls evaluation completed -    Any stairs in or around the home? Yes  If so, are there any without handrails? No  Home free of loose throw rugs in walkways, pet beds, electrical cords, etc? Yes  Adequate lighting in your home to reduce risk of falls? Yes   ASSISTIVE DEVICES UTILIZED TO PREVENT FALLS:  Life alert? No  Use of a cane, walker or w/c? No  Grab bars in the bathroom? No  Shower chair or bench in shower? No  Elevated toilet seat or a handicapped toilet? Yes   TIMED UP AND GO: na  Cognitive Function:     6CIT Screen 09/04/2019 12/19/2018  What Year? 0 points 0 points  What month? 0 points 0 points  What time? 0 points 0 points  Count back from 20 0 points 0 points  Months in reverse 0 points 0 points  Repeat phrase 0 points 0 points  Total Score 0 0    Immunizations Immunization History  Administered Date(s) Administered  . Influenza-Unspecified 10/18/2016, 12/08/2018  . Moderna SARS-COVID-2 Vaccination 02/21/2019, 03/21/2019    TDAP status: Due, Education has been provided regarding the importance of this vaccine. Advised may receive this vaccine at local pharmacy  or Health Dept. Aware to provide a copy of the vaccination record if obtained from local pharmacy or Health Dept. Verbalized acceptance and understanding. Flu  Vaccine status: Up to date Needs pneumococcal 23 Covid-19 vaccine status: Completed vaccines  Qualifies for Shingles Vaccine? No   Zostavax completed No   Shingrix Completed?: Yes  Screening Tests Health Maintenance  Topic Date Due  . FOOT EXAM  Never done  . TETANUS/TDAP  Never done  . DEXA SCAN  Never done  . PNA vac Low Risk Adult (1 of 2 - PCV13) Never done  . OPHTHALMOLOGY EXAM  07/17/2019  . INFLUENZA VACCINE  09/09/2019  . HEMOGLOBIN A1C  01/10/2020  . COVID-19 Vaccine  Completed    Health Maintenance  Health Maintenance Due  Topic Date Due  . FOOT EXAM  Never done  . TETANUS/TDAP  Never done  . DEXA SCAN  Never done  . PNA vac Low Risk Adult (1 of 2 - PCV13) Never done  . OPHTHALMOLOGY EXAM  07/17/2019    Colorectal cancer screening: No longer required.  Mammogram status: No longer required.  Bone density up to date- getting prolia.   Lung Cancer Screening: (Low Dose CT Chest recommended if Age 41-80 years, 30 pack-year currently smoking OR have quit w/in 15years.) does not qualify.   Lung Cancer Screening Referral: na  Additional Screening:  Hepatitis C Screening: does not qualify; Completed na  Vision Screening: Recommended annual ophthalmology exams for early detection of glaucoma and other disorders of the eye. Is the patient up to date with their annual eye exam?  Yes  Who is the provider or what is the name of the office in which the patient attends annual eye exams? Dr Valetta Close going every  6 months.  If pt is not established with a provider, would they like to be referred to a provider to establish care? No .   Dental Screening: Recommended annual dental exams for proper oral hygiene  Community Resource Referral / Chronic Care Management: CRR required this visit?  No   CCM required this visit?  No      Plan:     I have personally reviewed and noted the following in the patient's chart:   . Medical and social history . Use of alcohol,  tobacco or illicit drugs  . Current medications and supplements . Functional ability and status . Nutritional status . Physical activity . Advanced directives . List of other physicians . Hospitalizations, surgeries, and ER visits in previous 12 months . Vitals . Screenings to include cognitive, depression, and falls . Referrals and appointments  In addition, I have reviewed and discussed with patient certain preventive protocols, quality metrics, and best practice recommendations. A written personalized care plan for preventive services as well as general preventive health recommendations were provided to patient.     [name], NP   09/04/2019

## 2019-09-05 ENCOUNTER — Telehealth: Payer: Self-pay | Admitting: *Deleted

## 2019-09-05 DIAGNOSIS — R413 Other amnesia: Secondary | ICD-10-CM

## 2019-09-05 DIAGNOSIS — R42 Dizziness and giddiness: Secondary | ICD-10-CM

## 2019-09-05 NOTE — Telephone Encounter (Signed)
Patient daughter, [name], called and stated that patient is no better. Stated that she was seen on 7/26 and Janett Billow told her that if she had any concerns she would call her. Daughter is requesting to speak with you.  Stated that patient still has Headaches, Dizziness and gets real hot.  Also stated that the medication you prescribed is causing drowsiness.   Please Advise.

## 2019-09-06 NOTE — Telephone Encounter (Signed)
Called daughter, ordered CT head ordered due to ongoing dizziness and memory loss

## 2019-09-06 NOTE — Addendum Note (Signed)
Addended by: [name] on: 09/06/2019 10:50 AM   Modules accepted: Orders

## 2019-09-07 DIAGNOSIS — M0609 Rheumatoid arthritis without rheumatoid factor, multiple sites: Secondary | ICD-10-CM | POA: Diagnosis not present

## 2019-09-07 DIAGNOSIS — M810 Age-related osteoporosis without current pathological fracture: Secondary | ICD-10-CM | POA: Diagnosis not present

## 2019-09-07 DIAGNOSIS — M316 Other giant cell arteritis: Secondary | ICD-10-CM | POA: Diagnosis not present

## 2019-09-07 DIAGNOSIS — Z79899 Other long term (current) drug therapy: Secondary | ICD-10-CM | POA: Diagnosis not present

## 2019-09-07 DIAGNOSIS — F5101 Primary insomnia: Secondary | ICD-10-CM | POA: Diagnosis not present

## 2019-09-07 DIAGNOSIS — I7090 Unspecified atherosclerosis: Secondary | ICD-10-CM | POA: Diagnosis not present

## 2019-09-07 DIAGNOSIS — D508 Other iron deficiency anemias: Secondary | ICD-10-CM | POA: Diagnosis not present

## 2019-09-07 DIAGNOSIS — Z6820 Body mass index (BMI) 20.0-20.9, adult: Secondary | ICD-10-CM | POA: Diagnosis not present

## 2019-09-07 DIAGNOSIS — R5382 Chronic fatigue, unspecified: Secondary | ICD-10-CM | POA: Diagnosis not present

## 2019-09-15 DIAGNOSIS — Z7901 Long term (current) use of anticoagulants: Secondary | ICD-10-CM

## 2019-09-15 DIAGNOSIS — H548 Legal blindness, as defined in USA: Secondary | ICD-10-CM | POA: Diagnosis not present

## 2019-09-15 DIAGNOSIS — Z955 Presence of coronary angioplasty implant and graft: Secondary | ICD-10-CM | POA: Diagnosis not present

## 2019-09-15 DIAGNOSIS — M810 Age-related osteoporosis without current pathological fracture: Secondary | ICD-10-CM

## 2019-09-15 DIAGNOSIS — Z8551 Personal history of malignant neoplasm of bladder: Secondary | ICD-10-CM | POA: Diagnosis not present

## 2019-09-15 DIAGNOSIS — Z95828 Presence of other vascular implants and grafts: Secondary | ICD-10-CM | POA: Diagnosis not present

## 2019-09-15 DIAGNOSIS — Z87891 Personal history of nicotine dependence: Secondary | ICD-10-CM | POA: Diagnosis not present

## 2019-09-15 DIAGNOSIS — M069 Rheumatoid arthritis, unspecified: Secondary | ICD-10-CM

## 2019-09-15 DIAGNOSIS — I10 Essential (primary) hypertension: Secondary | ICD-10-CM | POA: Diagnosis not present

## 2019-09-15 DIAGNOSIS — I2510 Atherosclerotic heart disease of native coronary artery without angina pectoris: Secondary | ICD-10-CM | POA: Diagnosis not present

## 2019-09-15 DIAGNOSIS — E782 Mixed hyperlipidemia: Secondary | ICD-10-CM | POA: Diagnosis not present

## 2019-09-15 DIAGNOSIS — Z9181 History of falling: Secondary | ICD-10-CM | POA: Diagnosis not present

## 2019-09-15 DIAGNOSIS — E1151 Type 2 diabetes mellitus with diabetic peripheral angiopathy without gangrene: Secondary | ICD-10-CM | POA: Diagnosis not present

## 2019-09-15 DIAGNOSIS — Z7984 Long term (current) use of oral hypoglycemic drugs: Secondary | ICD-10-CM | POA: Diagnosis not present

## 2019-09-15 DIAGNOSIS — I700 Atherosclerosis of aorta: Secondary | ICD-10-CM | POA: Diagnosis not present

## 2019-09-17 DIAGNOSIS — Z23 Encounter for immunization: Secondary | ICD-10-CM | POA: Diagnosis not present

## 2019-09-18 DIAGNOSIS — E1151 Type 2 diabetes mellitus with diabetic peripheral angiopathy without gangrene: Secondary | ICD-10-CM | POA: Diagnosis not present

## 2019-09-18 DIAGNOSIS — I700 Atherosclerosis of aorta: Secondary | ICD-10-CM | POA: Diagnosis not present

## 2019-09-18 DIAGNOSIS — I2510 Atherosclerotic heart disease of native coronary artery without angina pectoris: Secondary | ICD-10-CM | POA: Diagnosis not present

## 2019-09-18 DIAGNOSIS — I10 Essential (primary) hypertension: Secondary | ICD-10-CM | POA: Diagnosis not present

## 2019-09-18 DIAGNOSIS — H548 Legal blindness, as defined in USA: Secondary | ICD-10-CM | POA: Diagnosis not present

## 2019-09-18 DIAGNOSIS — E782 Mixed hyperlipidemia: Secondary | ICD-10-CM | POA: Diagnosis not present

## 2019-09-20 ENCOUNTER — Other Ambulatory Visit: Payer: Self-pay

## 2019-09-20 ENCOUNTER — Ambulatory Visit
Admission: RE | Admit: 2019-09-20 | Discharge: 2019-09-20 | Disposition: A | Discharge status: Home / Self Care | Payer: Medicare Other | Source: Ambulatory Visit | Attending: Nurse Practitioner | Admitting: Nurse Practitioner

## 2019-09-20 DIAGNOSIS — E782 Mixed hyperlipidemia: Secondary | ICD-10-CM | POA: Diagnosis not present

## 2019-09-20 DIAGNOSIS — R413 Other amnesia: Secondary | ICD-10-CM

## 2019-09-20 DIAGNOSIS — I10 Essential (primary) hypertension: Secondary | ICD-10-CM | POA: Diagnosis not present

## 2019-09-20 DIAGNOSIS — I6782 Cerebral ischemia: Secondary | ICD-10-CM | POA: Diagnosis not present

## 2019-09-20 DIAGNOSIS — R42 Dizziness and giddiness: Secondary | ICD-10-CM

## 2019-09-20 DIAGNOSIS — G319 Degenerative disease of nervous system, unspecified: Secondary | ICD-10-CM | POA: Diagnosis not present

## 2019-09-20 DIAGNOSIS — H548 Legal blindness, as defined in USA: Secondary | ICD-10-CM | POA: Diagnosis not present

## 2019-09-20 DIAGNOSIS — I2510 Atherosclerotic heart disease of native coronary artery without angina pectoris: Secondary | ICD-10-CM | POA: Diagnosis not present

## 2019-09-20 DIAGNOSIS — I7090 Unspecified atherosclerosis: Secondary | ICD-10-CM | POA: Diagnosis not present

## 2019-09-20 DIAGNOSIS — I700 Atherosclerosis of aorta: Secondary | ICD-10-CM | POA: Diagnosis not present

## 2019-09-20 DIAGNOSIS — I6389 Other cerebral infarction: Secondary | ICD-10-CM | POA: Diagnosis not present

## 2019-09-20 DIAGNOSIS — E1151 Type 2 diabetes mellitus with diabetic peripheral angiopathy without gangrene: Secondary | ICD-10-CM | POA: Diagnosis not present

## 2019-09-25 DIAGNOSIS — E1151 Type 2 diabetes mellitus with diabetic peripheral angiopathy without gangrene: Secondary | ICD-10-CM | POA: Diagnosis not present

## 2019-09-25 DIAGNOSIS — H548 Legal blindness, as defined in USA: Secondary | ICD-10-CM | POA: Diagnosis not present

## 2019-09-25 DIAGNOSIS — I700 Atherosclerosis of aorta: Secondary | ICD-10-CM | POA: Diagnosis not present

## 2019-09-25 DIAGNOSIS — E782 Mixed hyperlipidemia: Secondary | ICD-10-CM | POA: Diagnosis not present

## 2019-09-25 DIAGNOSIS — I2510 Atherosclerotic heart disease of native coronary artery without angina pectoris: Secondary | ICD-10-CM | POA: Diagnosis not present

## 2019-09-25 DIAGNOSIS — I10 Essential (primary) hypertension: Secondary | ICD-10-CM | POA: Diagnosis not present

## 2019-10-02 DIAGNOSIS — M316 Other giant cell arteritis: Secondary | ICD-10-CM | POA: Diagnosis not present

## 2019-10-03 DIAGNOSIS — I700 Atherosclerosis of aorta: Secondary | ICD-10-CM | POA: Diagnosis not present

## 2019-10-03 DIAGNOSIS — E1151 Type 2 diabetes mellitus with diabetic peripheral angiopathy without gangrene: Secondary | ICD-10-CM | POA: Diagnosis not present

## 2019-10-03 DIAGNOSIS — I10 Essential (primary) hypertension: Secondary | ICD-10-CM | POA: Diagnosis not present

## 2019-10-03 DIAGNOSIS — E782 Mixed hyperlipidemia: Secondary | ICD-10-CM | POA: Diagnosis not present

## 2019-10-03 DIAGNOSIS — I2510 Atherosclerotic heart disease of native coronary artery without angina pectoris: Secondary | ICD-10-CM | POA: Diagnosis not present

## 2019-10-03 DIAGNOSIS — H548 Legal blindness, as defined in USA: Secondary | ICD-10-CM | POA: Diagnosis not present

## 2019-10-15 DIAGNOSIS — H540X33 Blindness right eye category 3, blindness left eye category 3: Secondary | ICD-10-CM | POA: Diagnosis not present

## 2019-10-16 ENCOUNTER — Other Ambulatory Visit: Payer: Self-pay | Admitting: Cardiovascular Disease

## 2019-10-24 ENCOUNTER — Other Ambulatory Visit: Payer: Self-pay

## 2019-10-24 ENCOUNTER — Encounter: Payer: Self-pay | Admitting: Nurse Practitioner

## 2019-10-24 ENCOUNTER — Ambulatory Visit (INDEPENDENT_AMBULATORY_CARE_PROVIDER_SITE_OTHER): Payer: Medicare Other | Admitting: Nurse Practitioner

## 2019-10-24 VITALS — BP 136/80 | HR 71 | Temp 96.9°F | Ht 61.0 in | Wt 110.0 lb

## 2019-10-24 DIAGNOSIS — I25118 Atherosclerotic heart disease of native coronary artery with other forms of angina pectoris: Secondary | ICD-10-CM | POA: Diagnosis not present

## 2019-10-24 DIAGNOSIS — E1169 Type 2 diabetes mellitus with other specified complication: Secondary | ICD-10-CM

## 2019-10-24 DIAGNOSIS — I10 Essential (primary) hypertension: Secondary | ICD-10-CM

## 2019-10-24 DIAGNOSIS — Z8551 Personal history of malignant neoplasm of bladder: Secondary | ICD-10-CM

## 2019-10-24 DIAGNOSIS — E782 Mixed hyperlipidemia: Secondary | ICD-10-CM | POA: Diagnosis not present

## 2019-10-24 DIAGNOSIS — I739 Peripheral vascular disease, unspecified: Secondary | ICD-10-CM

## 2019-10-24 DIAGNOSIS — R630 Anorexia: Secondary | ICD-10-CM | POA: Diagnosis not present

## 2019-10-24 DIAGNOSIS — G4700 Insomnia, unspecified: Secondary | ICD-10-CM

## 2019-10-24 DIAGNOSIS — Z23 Encounter for immunization: Secondary | ICD-10-CM | POA: Diagnosis not present

## 2019-10-24 DIAGNOSIS — M810 Age-related osteoporosis without current pathological fracture: Secondary | ICD-10-CM

## 2019-10-24 DIAGNOSIS — R42 Dizziness and giddiness: Secondary | ICD-10-CM

## 2019-10-24 NOTE — Progress Notes (Signed)
Careteam: Patient Care Team: [name], NP as PCP - General (Geriatric Medicine) [name], MD as PCP - Cardiology (Cardiology)  PLACE OF SERVICE:  [place] Directive information Does Patient Have a Medical Advance Directive?: Yes, Type of Advance Directive: Living will, Does patient want to make changes to medical advance directive?: No - Patient declined  Allergies  Allergen Reactions  . Benadryl [Diphenhydramine]     Side effects per records received from [place]   . Lipitor [Atorvastatin]     rash  . Tricor [Fenofibrate]     rash    Chief Complaint  Patient presents with  . Medical Management of Chronic Issues    3 month follow-up. Here with daughter [name]   . Immunizations    Will get flu vaccine today   . Quality Metric Gaps    Discuss need for Foot Exam, per patient DEXA up to date      HPI: Patient is a 85 y.o. female for routine follow up.   Dizziness has significantly improved with PT.   Insomnia- continues on remeron 7.5 mg qhs. Some nights are better than others.   DM- continues on metformin 500 mg BID, eacting more bagel and sweets. No hypoglycemia noted.   Osteoporosis- managed by rheumatologist, getting prolia with Dr Trudie Reed office. Reports Bone density up to date.   PAD- continues on low dose xarelto  GCA- followed by rheumatologist, ON ACTEMRA infusion.   GERD- started on omeprazole at last visit.   Review of Systems:  Review of Systems  Constitutional: Negative for chills, fever and weight loss.  HENT: Negative for tinnitus.   Respiratory: Negative for cough, sputum production and shortness of breath.   Cardiovascular: Negative for chest pain, palpitations and leg swelling.  Gastrointestinal: Negative for abdominal pain, constipation, diarrhea and heartburn.  Genitourinary: Negative for dysuria, frequency and urgency.  Musculoskeletal: Negative for back pain, falls, joint pain and myalgias.    Skin: Negative.   Neurological: Positive for dizziness (improved). Negative for headaches.  Psychiatric/Behavioral: Negative for depression and memory loss. The patient has insomnia.     Past Medical History:  Diagnosis Date  . Atherosclerosis of aorta [facility])    s/p AO bypass 2004, Per records received from [facility] Rheumatology   . Bladder cancer ([place]) 02/10/2010  . CAD (coronary artery disease)    Coronary intervention [facility] in 2000.  . Diabetes mellitus without complication ([place])   . Former tobacco use    Per records received from [facility]umat[facility]   . GCA (giant cell arteritis) ([place])   . Giant cell arteritis [facility])    Per records received from [facility] Rheumatology   . Hyperlipidemia   . Hypertension   . Osteoporosis    Prolia injection administered on 05/09/2019, Per records received from [facility] Rheumatology   . PAD (peripheral artery disease) (HCC)    s/p bilateral iliac-femoral artery bypass 2000, Per records received from [facility] Rheumatology   . Peripheral vascular disease of lower extremity ([place])    aortobifemoral bypass grafting at [facility] in [place] in 2004.   . Presence of stent in left circumflex coronary artery   . Rheumatoid arthritis ([place])    Per records received from [facility] Rheumatology    Past Surgical History:  Procedure Laterality Date  . AORTO-FEMORAL BYPASS GRAFT     aortobifemoral bypass grafting at [facility] in [place] in 2004.   Marland Kitchen CATARACT EXTRACTION    . HERNIA REPAIR    .  KIDNEY SURGERY  04/06/2011  . URETER SURGERY  05/05/2011   Social History:   reports that she quit smoking about 8 years ago. Her smoking use included cigarettes. She has a 64.00 pack-year smoking history. She has never used smokeless tobacco. She reports current alcohol use. She reports that she does not use drugs.  Family History  Problem Relation Age of Onset  . Stroke Mother   . Hypertension Mother        Per records received from [facility]  Rheumatology   . Lung cancer Father   . Heart attack Brother   . Hyperlipidemia Brother   . Hypertension Brother   . Diabetes Maternal Uncle   . Diabetes Maternal Grandmother   . Hypertension Son        Per records received from [facility] Rheumatology    Medications: Patient's Medications  New Prescriptions   No medications on file  Previous Medications   ASCORBIC ACID (VITAMIN C PO)    Take 1 tablet by mouth daily.   B COMPLEX VITAMINS TABLET    Take 1 tablet by mouth daily.   CALCIUM CARBONATE (CALCIUM 500 PO)    Take 1 tablet by mouth in the morning and at bedtime. Only 2 weeks prior to Prolia injection and 2 weeks after   DENOSUMAB (PROLIA) 60 MG/ML SOSY INJECTION    Inject 60 mg into the skin every 6 (six) months.   LOSARTAN (COZAAR) 100 MG TABLET    TAKE 1 TABLET BY MOUTH DAILY. (PRELACES VALSARTAN THAT WAS ON BACKORDER)   METFORMIN (GLUCOPHAGE) 500 MG TABLET    Take 1 tablet (500 mg total) by mouth 2 (two) times daily with a meal.   METOPROLOL TARTRATE (LOPRESSOR) 25 MG TABLET    TAKE 1 TABLET (25 MG TOTAL) BY MOUTH IN THE MORNING AND 1/2 (12.5 MG TOTAL) IN THE EVENING   MIRTAZAPINE (REMERON) 7.5 MG TABLET    Take 1 tablet (7.5 mg total) by mouth at bedtime.   MULTIPLE VITAMINS-MINERALS (ICAPS AREDS 2) CAPS    Take 2 capsules by mouth daily. Every morning and every evening   MULTIPLE VITAMINS-MINERALS (ZINC PO)    Take 1 tablet by mouth daily.   OMEPRAZOLE (PRILOSEC) 20 MG CAPSULE    Take 1 capsule (20 mg total) by mouth daily.   ROSUVASTATIN (CRESTOR) 10 MG TABLET    TAKE 1 TABLET BY MOUTH DAILY.   TOCILIZUMAB (ACTEMRA IV)    Inject into the vein every 30 (thirty) days.   VITAMIN D, ERGOCALCIFEROL, (DRISDOL) 1.25 MG (50000 UNIT) CAPS CAPSULE    Take 1 capsule (50,000 Units total) by mouth every 7 (seven) days.   XARELTO 2.5 MG TABS TABLET    TAKE 1 TABLET BY MOUTH 2 TIMES DAILY  Modified Medications   No medications on file  Discontinued Medications   ALPRAZOLAM (XANAX) 0.5  MG TABLET    Take 0.5 mg by mouth daily. As needed    Physical Exam:  Vitals:   10/24/19 1517  BP: 136/80  Pulse: 71  Temp: (!) 96.9 F (36.1 C)  TempSrc: Temporal  SpO2: 98%  Weight: 110 lb (49.9 kg)  Height: 5\' 1"  (1.549 m)   Body mass index is 20.78 kg/m. Wt Readings from Last 3 Encounters:  10/24/19 110 lb (49.9 kg)  09/03/19 108 lb (49 kg)  07/11/19 110 lb 6.4 oz (50.1 kg)    Physical Exam Constitutional:      General: She is not in acute distress.  Appearance: She is well-developed. She is not diaphoretic.  HENT:     Head: Normocephalic and atraumatic.     Mouth/Throat:     Pharynx: No oropharyngeal exudate.  Eyes:     Conjunctiva/sclera: Conjunctivae normal.     Pupils: Pupils are equal, round, and reactive to light.  Cardiovascular:     Rate and Rhythm: Normal rate and regular rhythm.     Pulses:          Dorsalis pedis pulses are 2+ on the right side and 2+ on the left side.     Heart sounds: Normal heart sounds.  Pulmonary:     Effort: Pulmonary effort is normal.     Breath sounds: Normal breath sounds.  Abdominal:     General: Bowel sounds are normal.     Palpations: Abdomen is soft.  Musculoskeletal:        General: No tenderness.     Cervical back: Normal range of motion and neck supple.  Feet:     Right foot:     Protective Sensation: 3 sites tested. 3 sites sensed.     Skin integrity: Skin integrity normal.     Toenail Condition: Right toenails are normal.     Left foot:     Protective Sensation: 3 sites tested. 3 sites sensed.     Skin integrity: Skin integrity normal.     Toenail Condition: Left toenails are normal.  Skin:    General: Skin is warm and dry.  Neurological:     Mental Status: She is alert and oriented to person, place, and time.     Labs reviewed: Basic Metabolic Panel: Recent Labs    12/27/18 0000 04/06/19 0000 07/11/19 1424 07/20/19 1404  NA 142 142  --  141  K 4.7 4.1  --  4.5  CL 104 101  --  105  CO2 20  23*  --  28  GLUCOSE  --   --  CANCELED 88  BUN  --  25*  --  22  CREATININE 1.0 1.1  --  0.96*  CALCIUM 10.0  --   --  9.7  TSH  --   --  CANCELED 3.51   Liver Function Tests: Recent Labs    12/27/18 0000 04/03/19 0000 07/20/19 1404  AST 18 17 12   ALT 20 15 12   ALKPHOS  --  57  --   BILITOT  --   --  0.8  PROT  --   --  6.3   No results for input(s): LIPASE, AMYLASE in the last 8760 hours. No results for input(s): AMMONIA in the last 8760 hours. CBC: Recent Labs    12/27/18 0000 12/27/18 0000 04/03/19 0000 07/11/19 1424 07/20/19 1404  WBC 8.0   < > 10.0 CANCELED 8.6  NEUTROABS  --   --   --   --  5,667  HGB 14.3  --  14.5  --  13.5  HCT 41  --  40  --  39.5  MCV  --   --   --   --  94.3  PLT 162  --  182  --  153   < > = values in this interval not displayed.   Lipid Panel: Recent Labs    07/11/19 1424 07/20/19 1404 08/28/19 0000  CHOL  --  157 167  HDL  --  41* 37  LDLCALC CANCELED 74 74  TRIG  --  345* 347*  CHOLHDL  --  3.8  --  TSH: Recent Labs    07/11/19 1424 07/20/19 1404  TSH CANCELED 3.51   A1C: Lab Results  Component Value Date   HGBA1C 6.5 (H) 07/11/2019     Assessment/Plan 1. Dizziness Improved with PT. Continue recommendations by PT.   2. Insomnia, unspecified type Stable on remeron, encouraged to stay active during the day to encourage better sleep.   3. Loss of appetite -has improved on remeron.   4. Type 2 diabetes mellitus with other specified complication, without long-term current use of insulin (HCC) -continues on metformin. No hypoglycemia -Encouraged dietary compliance, routine foot care/monitoring and to keep up with diabetic eye exams through ophthalmology  -follow up A1c  5. Mixed hyperlipidemia due to type 2 diabetes mellitus ([place]) -continues on crestor, goal LDL <70. Dietary modifications encouraged.  - Hemoglobin A1c  6. Age-related osteoporosis without current pathological fracture Followed by  rheumatology who gets bone density and gives her prolia. Continues on cal and vit D.   7. Essential hypertension -controlled on losartan and metoprolol tartrate 25 BID - BASIC METABOLIC PANEL WITH GFR - CBC with Differential/Platelet  8. Peripheral arterial disease ([place]) -continues on xarelto 2.5 mg daily per cardiology. No signs of abnormal bruising or bleeding. - CBC with Differential/Platelet  9. Need for influenza vaccination - Flu Vaccine QUAD High Dose(Fluad)  Next appt: 6 months, labs prior  Bayley Hurn K. [place], [facility] [phone]

## 2019-10-25 ENCOUNTER — Other Ambulatory Visit: Payer: Self-pay | Admitting: Nurse Practitioner

## 2019-10-25 DIAGNOSIS — G4700 Insomnia, unspecified: Secondary | ICD-10-CM

## 2019-10-25 DIAGNOSIS — R630 Anorexia: Secondary | ICD-10-CM

## 2019-10-25 LAB — CBC WITH DIFFERENTIAL/PLATELET
Absolute Monocytes: 910 cells/uL (ref 200–950)
Basophils Absolute: 34 cells/uL (ref 0–200)
Basophils Relative: 0.4 %
Eosinophils Absolute: 179 cells/uL (ref 15–500)
Eosinophils Relative: 2.1 %
HCT: 40.5 % (ref 35.0–45.0)
Hemoglobin: 13.7 g/dL (ref 11.7–15.5)
Lymphs Abs: 1777 cells/uL (ref 850–3900)
MCH: 31.4 pg (ref 27.0–33.0)
MCHC: 33.8 g/dL (ref 32.0–36.0)
MCV: 92.7 fL (ref 80.0–100.0)
MPV: 11.4 fL (ref 7.5–12.5)
Monocytes Relative: 10.7 %
Neutro Abs: 5602 cells/uL (ref 1500–7800)
Neutrophils Relative %: 65.9 %
Platelets: 167 10*3/uL (ref 140–400)
RBC: 4.37 10*6/uL (ref 3.80–5.10)
RDW: 11.9 % (ref 11.0–15.0)
Total Lymphocyte: 20.9 %
WBC: 8.5 10*3/uL (ref 3.8–10.8)

## 2019-10-25 LAB — BASIC METABOLIC PANEL WITH GFR
BUN/Creatinine Ratio: 34 (calc) — ABNORMAL HIGH (ref 6–22)
BUN: 32 mg/dL — ABNORMAL HIGH (ref 7–25)
CO2: 26 mmol/L (ref 20–32)
Calcium: 9.9 mg/dL (ref 8.6–10.4)
Chloride: 107 mmol/L (ref 98–110)
Creat: 0.94 mg/dL — ABNORMAL HIGH (ref 0.60–0.88)
GFR, Est African American: 65 mL/min/{1.73_m2} (ref >=60)
GFR, Est Non African American: 56 mL/min/{1.73_m2} — ABNORMAL LOW (ref >=60)
Glucose, Bld: 100 mg/dL (ref 65–139)
Potassium: 4.5 mmol/L (ref 3.5–5.3)
Sodium: 140 mmol/L (ref 135–146)

## 2019-10-25 LAB — HEMOGLOBIN A1C
Hgb A1c MFr Bld: 7 % of total Hgb — ABNORMAL HIGH (ref <5.7)
Mean Plasma Glucose: 154 (calc)
eAG (mmol/L): 8.5 (calc)

## 2019-10-30 DIAGNOSIS — M0609 Rheumatoid arthritis without rheumatoid factor, multiple sites: Secondary | ICD-10-CM | POA: Diagnosis not present

## 2019-10-30 DIAGNOSIS — Z79899 Other long term (current) drug therapy: Secondary | ICD-10-CM | POA: Diagnosis not present

## 2019-10-30 DIAGNOSIS — Z8551 Personal history of malignant neoplasm of bladder: Secondary | ICD-10-CM | POA: Insufficient documentation

## 2019-11-05 ENCOUNTER — Other Ambulatory Visit: Payer: Self-pay

## 2019-11-05 ENCOUNTER — Other Ambulatory Visit: Payer: Self-pay | Admitting: Nurse Practitioner

## 2019-11-05 MED ORDER — GLUCOSE BLOOD VI STRP: ORAL_STRIP | 2 refills | Status: DC

## 2019-11-06 ENCOUNTER — Other Ambulatory Visit: Payer: Self-pay | Admitting: *Deleted

## 2019-11-06 MED ORDER — GLUCOSE BLOOD VI STRP: ORAL_STRIP | 2 refills | Status: DC

## 2019-11-06 NOTE — Telephone Encounter (Signed)
Pharmacy requested refill

## 2019-11-14 DIAGNOSIS — M810 Age-related osteoporosis without current pathological fracture: Secondary | ICD-10-CM | POA: Diagnosis not present

## 2019-11-22 DIAGNOSIS — Z23 Encounter for immunization: Secondary | ICD-10-CM | POA: Diagnosis not present

## 2019-11-27 DIAGNOSIS — M0609 Rheumatoid arthritis without rheumatoid factor, multiple sites: Secondary | ICD-10-CM | POA: Diagnosis not present

## 2019-11-27 DIAGNOSIS — M316 Other giant cell arteritis: Secondary | ICD-10-CM | POA: Diagnosis not present

## 2019-12-08 ENCOUNTER — Other Ambulatory Visit: Payer: Self-pay | Admitting: Cardiovascular Disease

## 2019-12-08 DIAGNOSIS — I739 Peripheral vascular disease, unspecified: Secondary | ICD-10-CM

## 2019-12-10 NOTE — Telephone Encounter (Signed)
I cannot refill for 2.5mg  xarelto. Will route to pharmd pool

## 2019-12-17 ENCOUNTER — Other Ambulatory Visit: Payer: Self-pay | Admitting: Nurse Practitioner

## 2019-12-17 DIAGNOSIS — K219 Gastro-esophageal reflux disease without esophagitis: Secondary | ICD-10-CM

## 2019-12-25 DIAGNOSIS — M316 Other giant cell arteritis: Secondary | ICD-10-CM | POA: Diagnosis not present

## 2019-12-25 DIAGNOSIS — M0609 Rheumatoid arthritis without rheumatoid factor, multiple sites: Secondary | ICD-10-CM | POA: Diagnosis not present

## 2019-12-29 ENCOUNTER — Other Ambulatory Visit: Payer: Self-pay | Admitting: Nurse Practitioner

## 2019-12-29 DIAGNOSIS — E559 Vitamin D deficiency, unspecified: Secondary | ICD-10-CM

## 2020-01-14 ENCOUNTER — Other Ambulatory Visit: Payer: Self-pay | Admitting: Nurse Practitioner

## 2020-01-14 DIAGNOSIS — E1169 Type 2 diabetes mellitus with other specified complication: Secondary | ICD-10-CM

## 2020-01-22 DIAGNOSIS — M316 Other giant cell arteritis: Secondary | ICD-10-CM | POA: Diagnosis not present

## 2020-02-28 DIAGNOSIS — M0609 Rheumatoid arthritis without rheumatoid factor, multiple sites: Secondary | ICD-10-CM | POA: Diagnosis not present

## 2020-02-28 DIAGNOSIS — M316 Other giant cell arteritis: Secondary | ICD-10-CM | POA: Diagnosis not present

## 2020-02-28 LAB — CBC AND DIFFERENTIAL
HCT: 42 (ref 36–46)
Hemoglobin: 14.1 (ref 12.0–16.0)
Neutrophils Absolute: 3.7
Platelets: 167 (ref 150–399)
WBC: 6.6

## 2020-02-28 LAB — CBC: RBC: 4.4 (ref 3.87–5.11)

## 2020-03-07 ENCOUNTER — Other Ambulatory Visit: Payer: Self-pay | Admitting: Cardiovascular Disease

## 2020-03-07 DIAGNOSIS — I739 Peripheral vascular disease, unspecified: Secondary | ICD-10-CM

## 2020-03-07 NOTE — Telephone Encounter (Signed)
Prescription refill request for Xarelto received.  Indication:pad Last office visit:3/21 berry Weight:49.9 kg Age:86 Scr:0.94 9/21 CrCl:35.72 ml/min  Prescription refilled

## 2020-03-11 ENCOUNTER — Other Ambulatory Visit: Payer: Self-pay | Admitting: Cardiovascular Disease

## 2020-03-17 ENCOUNTER — Encounter: Payer: Self-pay | Admitting: *Deleted

## 2020-03-17 DIAGNOSIS — M810 Age-related osteoporosis without current pathological fracture: Secondary | ICD-10-CM | POA: Diagnosis not present

## 2020-03-17 DIAGNOSIS — R5382 Chronic fatigue, unspecified: Secondary | ICD-10-CM | POA: Diagnosis not present

## 2020-03-17 DIAGNOSIS — M0609 Rheumatoid arthritis without rheumatoid factor, multiple sites: Secondary | ICD-10-CM | POA: Diagnosis not present

## 2020-03-17 DIAGNOSIS — Z6821 Body mass index (BMI) 21.0-21.9, adult: Secondary | ICD-10-CM | POA: Diagnosis not present

## 2020-03-17 DIAGNOSIS — I7090 Unspecified atherosclerosis: Secondary | ICD-10-CM | POA: Diagnosis not present

## 2020-03-17 DIAGNOSIS — D508 Other iron deficiency anemias: Secondary | ICD-10-CM | POA: Diagnosis not present

## 2020-03-17 DIAGNOSIS — F5101 Primary insomnia: Secondary | ICD-10-CM | POA: Diagnosis not present

## 2020-03-17 DIAGNOSIS — M316 Other giant cell arteritis: Secondary | ICD-10-CM | POA: Diagnosis not present

## 2020-03-17 DIAGNOSIS — Z79899 Other long term (current) drug therapy: Secondary | ICD-10-CM | POA: Diagnosis not present

## 2020-03-17 DIAGNOSIS — E119 Type 2 diabetes mellitus without complications: Secondary | ICD-10-CM | POA: Diagnosis not present

## 2020-03-21 ENCOUNTER — Other Ambulatory Visit: Payer: Self-pay | Admitting: Nurse Practitioner

## 2020-03-21 ENCOUNTER — Telehealth: Payer: Self-pay | Admitting: Nurse Practitioner

## 2020-03-21 DIAGNOSIS — G4700 Insomnia, unspecified: Secondary | ICD-10-CM

## 2020-03-21 DIAGNOSIS — E0865 Diabetes mellitus due to underlying condition with hyperglycemia: Secondary | ICD-10-CM

## 2020-03-21 DIAGNOSIS — R630 Anorexia: Secondary | ICD-10-CM

## 2020-03-21 DIAGNOSIS — E1169 Type 2 diabetes mellitus with other specified complication: Secondary | ICD-10-CM

## 2020-03-21 MED ORDER — GLUCOSE BLOOD VI STRP: ORAL_STRIP | 2 refills | Status: DC

## 2020-03-21 MED ORDER — MIRTAZAPINE 15 MG PO TABS: 15.0000 mg | ORAL_TABLET | Freq: Every day | ORAL | 1 refills | Status: DC

## 2020-03-21 MED ORDER — METFORMIN HCL 500 MG PO TABS
ORAL_TABLET | ORAL | 1 refills | Status: DC
Start: 2020-03-21 — End: 2020-10-06

## 2020-03-21 NOTE — Telephone Encounter (Signed)
pts blood sugars ranging 200s, this morning fasting was 277. She is eating more sweet and breads, did not want to make dietary modifications. Will increase metformin to 1000 mg by mouth daily in am and continue metformin 500 mg at supper. She will increase her blood sugar checks to twice daily and come into the office to obtain an A1c, to monitor for hypoglycemia.

## 2020-03-27 DIAGNOSIS — M316 Other giant cell arteritis: Secondary | ICD-10-CM | POA: Diagnosis not present

## 2020-03-27 DIAGNOSIS — M0609 Rheumatoid arthritis without rheumatoid factor, multiple sites: Secondary | ICD-10-CM | POA: Diagnosis not present

## 2020-04-14 ENCOUNTER — Other Ambulatory Visit: Payer: Self-pay

## 2020-04-14 ENCOUNTER — Ambulatory Visit (HOSPITAL_BASED_OUTPATIENT_CLINIC_OR_DEPARTMENT_OTHER)
Admission: RE | Admit: 2020-04-14 | Discharge: 2020-04-14 | Disposition: A | Discharge status: Home / Self Care | Payer: Medicare Other | Source: Ambulatory Visit | Attending: Cardiovascular Disease | Admitting: Cardiovascular Disease

## 2020-04-14 ENCOUNTER — Other Ambulatory Visit (HOSPITAL_COMMUNITY): Payer: Self-pay | Admitting: Cardiovascular Disease

## 2020-04-14 ENCOUNTER — Other Ambulatory Visit: Payer: Self-pay | Admitting: Nurse Practitioner

## 2020-04-14 ENCOUNTER — Ambulatory Visit (HOSPITAL_COMMUNITY)
Admission: RE | Admit: 2020-04-14 | Discharge: 2020-04-14 | Disposition: A | Discharge status: Home / Self Care | Payer: Medicare Other | Source: Ambulatory Visit | Attending: Cardiology | Admitting: Cardiology

## 2020-04-14 DIAGNOSIS — I739 Peripheral vascular disease, unspecified: Secondary | ICD-10-CM

## 2020-04-14 DIAGNOSIS — E0865 Diabetes mellitus due to underlying condition with hyperglycemia: Secondary | ICD-10-CM

## 2020-04-14 DIAGNOSIS — Z95828 Presence of other vascular implants and grafts: Secondary | ICD-10-CM

## 2020-04-14 MED ORDER — GLUCOSE BLOOD VI STRP: ORAL_STRIP | 1 refills | Status: DC

## 2020-04-18 ENCOUNTER — Ambulatory Visit (INDEPENDENT_AMBULATORY_CARE_PROVIDER_SITE_OTHER): Payer: Medicare Other | Admitting: Cardiovascular Disease

## 2020-04-18 ENCOUNTER — Encounter: Payer: Self-pay | Admitting: Cardiovascular Disease

## 2020-04-18 ENCOUNTER — Other Ambulatory Visit: Payer: Medicare Other

## 2020-04-18 ENCOUNTER — Other Ambulatory Visit: Payer: Self-pay

## 2020-04-18 DIAGNOSIS — E782 Mixed hyperlipidemia: Secondary | ICD-10-CM

## 2020-04-18 DIAGNOSIS — Z955 Presence of coronary angioplasty implant and graft: Secondary | ICD-10-CM | POA: Diagnosis not present

## 2020-04-18 DIAGNOSIS — Z95828 Presence of other vascular implants and grafts: Secondary | ICD-10-CM

## 2020-04-18 DIAGNOSIS — E1169 Type 2 diabetes mellitus with other specified complication: Secondary | ICD-10-CM | POA: Diagnosis not present

## 2020-04-18 DIAGNOSIS — E0865 Diabetes mellitus due to underlying condition with hyperglycemia: Secondary | ICD-10-CM | POA: Diagnosis not present

## 2020-04-18 DIAGNOSIS — E781 Pure hyperglyceridemia: Secondary | ICD-10-CM

## 2020-04-18 DIAGNOSIS — I152 Hypertension secondary to endocrine disorders: Secondary | ICD-10-CM | POA: Diagnosis not present

## 2020-04-18 DIAGNOSIS — E1159 Type 2 diabetes mellitus with other circulatory complications: Secondary | ICD-10-CM | POA: Diagnosis not present

## 2020-04-18 NOTE — Progress Notes (Signed)
04/18/2020 Anne Pena   April 11, 1934  [id number]  Primary Physician [name], NP Primary Cardiologist: [name] MD Anne Pena, [place]  HPI:  Anne Pena is a 86 y.o.  thin and fit appearing widowed Caucasian female mother of 65, grandmother and 3 grandchildren who is accompanied by 1 of her daughtersPaige, who she lives with. She was referred by Dr. Raliegh Scarlet to be established in my practice because of known history of CAD and PAD. 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   I last saw her in the office 10/24/2018.  Risk factors include 50 pack years of tobacco abuse having quit 7 years ago, treated hypertension, diabetes and hyperlipidemia. She is never had a heart attack or stroke. She did have coronary intervention by Dr. Hale Drone at [facility] in 2000. She had aortobifemoral bypass grafting at [facility] in [place] in 2004. She denies chest pain, shortness of breath or claudication. She was told that she did have moderate left ICA stenosis.  Since I saw her in the office a year and a half ago she continues to do well.  She has hired a Physiological scientist who she works out with once a week.  She denies chest pain, shortness of breath or claudication.  She does have restless legs syndrome type symptoms at night.  Her Dopplers of her aortobifem have remained stable..   Current Meds  Medication Sig  . Ascorbic Acid (VITAMIN C PO) Take 1 tablet by mouth daily.  Marland Kitchen b complex vitamins tablet Take 1 tablet by mouth daily.  . Calcium Carbonate (CALCIUM 500 PO) Take 1 tablet by mouth in the morning and at bedtime. Only 2 weeks prior to Prolia injection and 2 weeks after  . denosumab (PROLIA) 60 MG/ML SOSY injection Inject 60 mg into the skin every 6 (six) months.  Marland Kitchen glucose blood test strip Use to test blood sugar twice daily. Dx:E11.69  . losartan (COZAAR) 100 MG tablet TAKE 1 TABLET BY MOUTH DAILY. (PRELACES VALSARTAN THAT WAS ON BACKORDER)  .  metFORMIN (GLUCOPHAGE) 500 MG tablet To use 1000 mg by mouth daily in the morning and 500 mg with the evening meal.  . metoprolol tartrate (LOPRESSOR) 25 MG tablet TAKE 1 TABLET BY MOUTH IN THE MORNING AND 1/2 IN THE EVENING  . mirtazapine (REMERON) 15 MG tablet Take 1 tablet (15 mg total) by mouth at bedtime.  . Multiple Vitamins-Minerals (ICAPS AREDS 2) CAPS Take 2 capsules by mouth daily. Every morning and every evening  . Multiple Vitamins-Minerals (ZINC PO) Take 1 tablet by mouth daily.  Marland Kitchen omeprazole (PRILOSEC) 20 MG capsule TAKE 1 CAPSULE (20 MG TOTAL) BY MOUTH DAILY.  . rosuvastatin (CRESTOR) 10 MG tablet TAKE 1 TABLET BY MOUTH DAILY.  Marland Kitchen Tocilizumab (ACTEMRA IV) Inject into the vein every 30 (thirty) days.  . Vitamin D, Ergocalciferol, (DRISDOL) 1.25 MG (50000 UNIT) CAPS capsule TAKE 1 CAPSULE BY MOUTH EVERY 7 DAYS.  Marland Kitchen XARELTO 2.5 MG TABS tablet TAKE 1 TABLET BY MOUTH 2 TIMES DAILY     Allergies  Allergen Reactions  . Benadryl [Diphenhydramine]     Side effects per records received from [place]   . Lipitor [Atorvastatin]     rash  . Tricor [Fenofibrate]     rash    Social History   Socioeconomic History  . Marital status: Widowed    Spouse name: Not on file  . Number of children: Not on file  .  Years of education: Not on file  . Highest education level: Not on file  Occupational History  . Not on file  Tobacco Use  . Smoking status: Former Smoker    Packs/day: 1.00    Years: 64.00    Pack years: 64.00    Types: Cigarettes    Quit date: 04/06/2011    Years since quitting: 9.0  . Smokeless tobacco: Never Used  Vaping Use  . Vaping Use: Never used  Substance and Sexual Activity  . Alcohol use: Yes    Comment: Rarely   . Drug use: Never  . Sexual activity: Not Currently  Other Topics Concern  . Not on file  Social History Narrative  . Not on file   Social Determinants of Health   Financial Resource Strain: Not on file  Food Insecurity: Not on  file  Transportation Needs: Not on file  Physical Activity: Not on file  Stress: Not on file  Social Connections: Not on file  Intimate Partner Violence: Not on file     Review of Systems: General: negative for chills, fever, night sweats or weight changes.  Cardiovascular: negative for chest pain, dyspnea on exertion, edema, orthopnea, palpitations, paroxysmal nocturnal dyspnea or shortness of breath Dermatological: negative for rash Respiratory: negative for cough or wheezing Urologic: negative for hematuria Abdominal: negative for nausea, vomiting, diarrhea, bright red blood per rectum, melena, or hematemesis Neurologic: negative for visual changes, syncope, or dizziness All other systems reviewed and are otherwise negative except as noted above.    Blood pressure 130/64, pulse 87, height 5\' 1"  (1.549 m), weight 110 lb (49.9 kg).  General appearance: alert and no distress Neck: no adenopathy, no carotid bruit, no JVD, supple, symmetrical, trachea midline and thyroid not enlarged, symmetric, no tenderness/mass/nodules Lungs: clear to auscultation bilaterally Heart: regular rate and rhythm, S1, S2 normal, no murmur, click, rub or gallop Extremities: extremities normal, atraumatic, no cyanosis or edema Pulses: 2+ and symmetric Skin: Skin color, texture, turgor normal. No rashes or lesions Neurologic: Alert and oriented X 3, normal strength and tone. Normal symmetric reflexes. Normal coordination and gait  EKG sinus rhythm 87 without ST or T wave changes.  There are septal Q waves.  I personally reviewed this EKG.  ASSESSMENT AND PLAN:   Presence of stent in left circumflex coronary artery History of CAD status post coronary intervention by Dr. Hale Drone at [facility] in 2000 with a stent to her circumflex.  She is asymptomatic.  Mixed diabetic hyperlipidemia associated with type 2 diabetes mellitus ([place]) History of hyperlipidemia on statin therapy with lipid profile performed  08/28/2019 revealing a total cholesterol 167, LDL 74 and HDL of 37.  Her triglyceride level was elevated at 347 which apparently according to her daughter they are working on.  S/P aorto-bifemoral bypass surgery History of aortobifemoral bypass grafting in 2004 in [place].  We are following her Doppler studies recently performed 04/14/2020 revealing ABIs in the 0.9 range bilaterally with mild to moderately elevated distal anastomotic velocities.  The patient really denies claudication.  Hypertension associated with diabetes ([place]) History of essential hypertension a blood pressure measured today 130/64.  She is on losartan and metoprolol.  Hypertriglyceridemia History of hyperlipidemia on Crestor with lipid profile performed 08/28/2019 revealing total cholesterol 167, LDL 74 HDL 37.  Her triglyceride level is elevated at 347 and apparently they are working on her diet.      [name] MD FACP,FACC,FAHA, [facility] 04/18/2020 11:04 AM

## 2020-04-18 NOTE — Assessment & Plan Note (Signed)
History of essential hypertension a blood pressure measured today 130/64.  She is on losartan and metoprolol.

## 2020-04-18 NOTE — Assessment & Plan Note (Signed)
History of hyperlipidemia on statin therapy with lipid profile performed 08/28/2019 revealing a total cholesterol 167, LDL 74 and HDL of 37.  Her triglyceride level was elevated at 347 which apparently according to her daughter they are working on.

## 2020-04-18 NOTE — Assessment & Plan Note (Signed)
History of hyperlipidemia on Crestor with lipid profile performed 08/28/2019 revealing total cholesterol 167, LDL 74 HDL 37.  Her triglyceride level is elevated at 347 and apparently they are working on her diet.

## 2020-04-18 NOTE — Patient Instructions (Signed)

## 2020-04-18 NOTE — Assessment & Plan Note (Signed)
History of aortobifemoral bypass grafting in 2004 in [place].  We are following her Doppler studies recently performed 04/14/2020 revealing ABIs in the 0.9 range bilaterally with mild to moderately elevated distal anastomotic velocities.  The patient really denies claudication.

## 2020-04-18 NOTE — Assessment & Plan Note (Signed)
History of CAD status post coronary intervention by Dr. Hale Drone at [facility] in 2000 with a stent to her circumflex.  She is asymptomatic.

## 2020-04-19 LAB — HEMOGLOBIN A1C
Hgb A1c MFr Bld: 6.4 % of total Hgb — ABNORMAL HIGH (ref <5.7)
Mean Plasma Glucose: 137 mg/dL
eAG (mmol/L): 7.6 mmol/L

## 2020-04-21 ENCOUNTER — Other Ambulatory Visit: Payer: Self-pay | Admitting: *Deleted

## 2020-04-21 DIAGNOSIS — E0865 Diabetes mellitus due to underlying condition with hyperglycemia: Secondary | ICD-10-CM

## 2020-04-21 MED ORDER — GLUCOSE BLOOD VI STRP: ORAL_STRIP | 1 refills | Status: DC

## 2020-04-21 NOTE — Telephone Encounter (Signed)
Received fax from pharmacy stating that Insurance will not pay for Test strips Twice daily for non insulin patient's. Need Rx faxed for Testing for once daily in order to be covered by insurance.   Pended Rx and sent to [facility] for approval.

## 2020-04-22 ENCOUNTER — Other Ambulatory Visit: Payer: Self-pay | Admitting: Nurse Practitioner

## 2020-04-22 DIAGNOSIS — K219 Gastro-esophageal reflux disease without esophagitis: Secondary | ICD-10-CM

## 2020-04-23 ENCOUNTER — Other Ambulatory Visit: Payer: Self-pay

## 2020-04-23 ENCOUNTER — Ambulatory Visit (INDEPENDENT_AMBULATORY_CARE_PROVIDER_SITE_OTHER): Payer: Medicare Other | Admitting: Nurse Practitioner

## 2020-04-23 ENCOUNTER — Encounter: Payer: Self-pay | Admitting: Nurse Practitioner

## 2020-04-23 VITALS — BP 132/80 | HR 85 | Temp 96.9°F | Ht 61.0 in | Wt 109.0 lb

## 2020-04-23 DIAGNOSIS — M810 Age-related osteoporosis without current pathological fracture: Secondary | ICD-10-CM

## 2020-04-23 DIAGNOSIS — I10 Essential (primary) hypertension: Secondary | ICD-10-CM

## 2020-04-23 DIAGNOSIS — G4700 Insomnia, unspecified: Secondary | ICD-10-CM | POA: Diagnosis not present

## 2020-04-23 DIAGNOSIS — Z955 Presence of coronary angioplasty implant and graft: Secondary | ICD-10-CM

## 2020-04-23 DIAGNOSIS — E0865 Diabetes mellitus due to underlying condition with hyperglycemia: Secondary | ICD-10-CM | POA: Diagnosis not present

## 2020-04-23 DIAGNOSIS — M316 Other giant cell arteritis: Secondary | ICD-10-CM | POA: Diagnosis not present

## 2020-04-23 DIAGNOSIS — E782 Mixed hyperlipidemia: Secondary | ICD-10-CM | POA: Diagnosis not present

## 2020-04-23 DIAGNOSIS — E2839 Other primary ovarian failure: Secondary | ICD-10-CM | POA: Diagnosis not present

## 2020-04-23 DIAGNOSIS — E1169 Type 2 diabetes mellitus with other specified complication: Secondary | ICD-10-CM

## 2020-04-23 DIAGNOSIS — Z95828 Presence of other vascular implants and grafts: Secondary | ICD-10-CM

## 2020-04-23 MED ORDER — GLUCOSE BLOOD VI STRP: ORAL_STRIP | 1 refills | Status: DC

## 2020-04-23 NOTE — Patient Instructions (Signed)
  To follow up in 4 months, labs prior to visit  Sign record release for bone density

## 2020-04-23 NOTE — Progress Notes (Signed)
Careteam: Patient Care Team: [name], NP as PCP - General (Geriatric Medicine) [name], MD as PCP - Cardiology (Cardiology) [name], MD as Consulting Physician (Ophthalmology)  PLACE OF SERVICE:  [place] Directive information Does Patient Have a Medical Advance Directive?: Yes, Type of Advance Directive: [place];Living will, Does patient want to make changes to medical advance directive?: No - Patient declined  Allergies  Allergen Reactions  . Benadryl [Diphenhydramine]     Side effects per records received from [place]   . Lipitor [Atorvastatin]     rash  . Tricor [Fenofibrate]     rash    Chief Complaint  Patient presents with  . Medical Management of Chronic Issues    6 month follow-up and discuss labs (copy printed). Discuss need for DEXA. Patient has a log of blood sugars. Discuss CBD gummy      HPI: Patient is a 86 y.o. female for routine follow up.   Dm- had some very elevated blood sugars in the 200s. Was advised to increase metformin to 1000 mg in the morning and 500 mg in the evening.  Tolerating dose adjustment.   Osteoporosis- prolia given by rheumatologist, goes to [facility].  Next injection supposed to be April 7th.   CAD s/p aorto-bifemoral bypass surgery- doing well. Followed by cardiology, remains on xarelto 2.5 mg twice daily  Insomnia- continues on Remeron. Sometimes will sleep 12 hours but sometimes has a hard time getting to bed.      Review of Systems:  Review of Systems  Constitutional: Negative for chills, fever and weight loss.  HENT: Negative for tinnitus.   Respiratory: Negative for cough, sputum production and shortness of breath.   Cardiovascular: Negative for chest pain, palpitations and leg swelling.  Gastrointestinal: Negative for abdominal pain, constipation, diarrhea and heartburn.  Genitourinary: Negative for dysuria, frequency and urgency.   Musculoskeletal: Negative for back pain, falls, joint pain and myalgias.  Skin: Negative.   Neurological: Negative for dizziness and headaches.  Psychiatric/Behavioral: Negative for depression and memory loss. The patient has insomnia.     Past Medical History:  Diagnosis Date  . Atherosclerosis of aorta [facility])    s/p AO bypass 2004, Per records received from [facility] Rheumatology   . Bladder cancer ([place]) 02/10/2010  . CAD (coronary artery disease)    Coronary intervention [facility] in 2000.  . Diabetes mellitus without complication ([place])   . Former tobacco use    Per records received from [facility] Rheumatology   . GCA (giant cell arteritis) ([place])   . Giant cell arteritis [facility])    Per records received from [facility] Rheumatology   . Hyperlipidemia   . Hypertension   . Osteoporosis    Prolia injection administered on 05/09/2019, Per records received from [facility] Rheumatology   . PAD (peripheral artery disease) (HCC)    s/p bilateral iliac-femoral artery bypass 2000, Per records received from [facility] Rheumatology   . Peripheral vascular disease of lower extremity ([place])    aortobifemoral bypass grafting at [facility] in [place] in 2004.   . Presence of stent in left circumflex coronary artery   . Rheumatoid arthritis ([place])    Per records received from [facility] Rheumatology    Past Surgical History:  Procedure Laterality Date  . AORTO-FEMORAL BYPASS GRAFT     aortobifemoral bypass grafting at [facility] in [place] in 2004.   [facility] CATARACT EXTRACTION    . HERNIA REPAIR    .  KIDNEY SURGERY  04/06/2011  . URETER SURGERY  05/05/2011   Social History:   reports that she quit smoking about 9 years ago. Her smoking use included cigarettes. She has a 64.00 pack-year smoking history. She has never used smokeless tobacco. She reports current alcohol use. She reports that she does not use drugs.  Family History  Problem Relation Age of Onset  . Stroke Mother   .  Hypertension Mother        Per records received from [facility] Rheumatology   . Lung cancer Father   . Heart attack Brother   . Hyperlipidemia Brother   . Hypertension Brother   . Diabetes Maternal Uncle   . Diabetes Maternal Grandmother   . Hypertension Son        Per records received from [facility] Rheumatology    Medications: Patient's Medications  New Prescriptions   No medications on file  Previous Medications   ASCORBIC ACID (VITAMIN C PO)    Take 1 tablet by mouth daily.   B COMPLEX VITAMINS TABLET    Take 1 tablet by mouth daily.   CALCIUM CARBONATE (CALCIUM 500 PO)    Take 1 tablet by mouth in the morning and at bedtime. Only 2 weeks prior to Prolia injection and 2 weeks after   DENOSUMAB (PROLIA) 60 MG/ML SOSY INJECTION    Inject 60 mg into the skin every 6 (six) months.   GLUCOSE BLOOD TEST STRIP    Use to test blood sugar once daily. Dx:E11.69   LOSARTAN (COZAAR) 100 MG TABLET    TAKE 1 TABLET BY MOUTH DAILY. (PRELACES VALSARTAN THAT WAS ON BACKORDER)   METFORMIN (GLUCOPHAGE) 500 MG TABLET    To use 1000 mg by mouth daily in the morning and 500 mg with the evening meal.   METOPROLOL TARTRATE (LOPRESSOR) 25 MG TABLET    TAKE 1 TABLET BY MOUTH IN THE MORNING AND 1/2 IN THE EVENING   MIRTAZAPINE (REMERON) 15 MG TABLET    Take 1 tablet (15 mg total) by mouth at bedtime.   MULTIPLE VITAMINS-MINERALS (ICAPS AREDS 2) CAPS    Take 2 capsules by mouth daily. Every morning and every evening   MULTIPLE VITAMINS-MINERALS (ZINC PO)    Take 1 tablet by mouth daily.   OMEPRAZOLE (PRILOSEC) 20 MG CAPSULE    TAKE 1 CAPSULE (20 MG TOTAL) BY MOUTH DAILY.   ROSUVASTATIN (CRESTOR) 10 MG TABLET    TAKE 1 TABLET BY MOUTH DAILY.   TOCILIZUMAB (ACTEMRA IV)    Inject into the vein every 30 (thirty) days.   VITAMIN D, ERGOCALCIFEROL, (DRISDOL) 1.25 MG (50000 UNIT) CAPS CAPSULE    TAKE 1 CAPSULE BY MOUTH EVERY 7 DAYS.   XARELTO 2.5 MG TABS TABLET    TAKE 1 TABLET BY MOUTH 2 TIMES DAILY  Modified  Medications   No medications on file  Discontinued Medications   No medications on file    Physical Exam:  Vitals:   04/23/20 1412  BP: 132/80  Pulse: 85  Temp: (!) 96.9 F (36.1 C)  TempSrc: Temporal  SpO2: 96%  Weight: 109 lb (49.4 kg)  Height: 5' 1"  (1.549 m)   Body mass index is 20.6 kg/m. Wt Readings from Last 3 Encounters:  04/23/20 109 lb (49.4 kg)  04/18/20 110 lb (49.9 kg)  10/24/19 110 lb (49.9 kg)    Physical Exam Constitutional:      General: She is not in acute distress.    Appearance: She is well-developed. She  is not diaphoretic.  HENT:     Head: Normocephalic and atraumatic.     Mouth/Throat:     Pharynx: No oropharyngeal exudate.  Eyes:     Conjunctiva/sclera: Conjunctivae normal.     Pupils: Pupils are equal, round, and reactive to light.  Cardiovascular:     Rate and Rhythm: Normal rate and regular rhythm.     Heart sounds: Normal heart sounds.  Pulmonary:     Effort: Pulmonary effort is normal.     Breath sounds: Normal breath sounds.  Abdominal:     General: Bowel sounds are normal.     Palpations: Abdomen is soft.  Musculoskeletal:        General: No tenderness.     Cervical back: Normal range of motion and neck supple.  Skin:    General: Skin is warm and dry.  Neurological:     Mental Status: She is alert and oriented to person, place, and time.  Psychiatric:        Mood and Affect: Mood normal.        Behavior: Behavior normal.     Labs reviewed: Basic Metabolic Panel: Recent Labs    07/11/19 1424 07/20/19 1404 10/24/19 1600  NA  --  141 140  K  --  4.5 4.5  CL  --  105 107  CO2  --  28 26  GLUCOSE CANCELED 88 100  BUN  --  22 32*  CREATININE  --  0.96* 0.94*  CALCIUM  --  9.7 9.9  TSH CANCELED 3.51  --    Liver Function Tests: Recent Labs    07/20/19 1404  AST 12  ALT 12  BILITOT 0.8  PROT 6.3   No results for input(s): LIPASE, AMYLASE in the last 8760 hours. No results for input(s): AMMONIA in the last  8760 hours. CBC: Recent Labs    07/20/19 1404 10/24/19 1600 02/28/20 0000  WBC 8.6 8.5 6.6  NEUTROABS 5,667 5,602 3.70  HGB 13.5 13.7 14.1  HCT 39.5 40.5 42  MCV 94.3 92.7  --   PLT 153 167 167   Lipid Panel: Recent Labs    07/11/19 1424 07/20/19 1404 08/28/19 0000  CHOL  --  157 167  HDL  --  41* 37  LDLCALC CANCELED 74 74  TRIG  --  345* 347*  CHOLHDL  --  3.8  --    TSH: Recent Labs    07/11/19 1424 07/20/19 1404  TSH CANCELED 3.51   A1C: Lab Results  Component Value Date   HGBA1C 6.4 (H) 04/18/2020     Assessment/Plan 1. Diabetes mellitus due to underlying condition with hyperglycemia, without long-term current use of insulin (HCC) - blood sugars are reviewed and well controlled.  Encouraged dietary compliance, routine foot care/monitoring and to keep up with diabetic eye exams through ophthalmology  -continue metformin 1000 mg in am and 500 mg in pm. A1c at goal. No hypoglycemia.  - glucose blood test strip; Use to test blood sugar once daily. Dx:E11.69  Dispense: 90 each; Refill: 1 - Hemoglobin A1c; Future - CMP with eGFR([facility]); Future  2. Insomnia, unspecified type -ongoing, some nights are better than others. Continues on remeron 15 mg qhs  3. Age-related osteoporosis without current pathological fracture -would like to start getting prolia in our office for convenience  - CMP with eGFR([facility]); Future  4. Essential hypertension -controlled on lopressor and losartan.  - CMP with eGFR([facility]); Future  5. GCA (giant cell arteritis) ([place]) Continues  to be followed by rheumatology. Getting actemra IV monthly.  6. Hx of aorto-femoral bypass Stable, followed by cardiology, without symptoms at this time.  7. Presence of stent in left circumflex coronary artery Stable. Continues on xarelto 2.5 mg twice daily.   8. Estrogen deficiency - DG Bone Density; Future  9. Mixed hyperlipidemia due to type 2 diabetes mellitus (HCC) -dietary modifications  encouraged. Continues on crestor 10 mg daily  - CMP with eGFR([facility]); Future - Lipid Panel; Future   Next appt: 4 months, labs prior  Madalin Hughart K. [place], [facility] [phone]

## 2020-04-30 ENCOUNTER — Encounter: Payer: Self-pay | Admitting: *Deleted

## 2020-05-01 DIAGNOSIS — M316 Other giant cell arteritis: Secondary | ICD-10-CM | POA: Diagnosis not present

## 2020-05-06 ENCOUNTER — Encounter: Payer: Self-pay | Admitting: Nurse Practitioner

## 2020-05-08 ENCOUNTER — Other Ambulatory Visit: Payer: Self-pay | Admitting: Nurse Practitioner

## 2020-05-08 DIAGNOSIS — G4700 Insomnia, unspecified: Secondary | ICD-10-CM

## 2020-05-08 DIAGNOSIS — R630 Anorexia: Secondary | ICD-10-CM

## 2020-05-08 MED ORDER — MIRTAZAPINE 15 MG PO TABS: 30.0000 mg | ORAL_TABLET | Freq: Every day | ORAL | 1 refills | Status: DC

## 2020-05-08 NOTE — Progress Notes (Signed)
Daughter called in need of a refill, pt was taking 2 of the 15 mg tablets. She was doing well. Sleeping good on current dose. Request to stay on this dosage. Will trial at this time.

## 2020-05-16 ENCOUNTER — Other Ambulatory Visit: Payer: Self-pay

## 2020-05-16 ENCOUNTER — Ambulatory Visit (INDEPENDENT_AMBULATORY_CARE_PROVIDER_SITE_OTHER): Payer: Medicare Other | Admitting: *Deleted

## 2020-05-16 DIAGNOSIS — M810 Age-related osteoporosis without current pathological fracture: Secondary | ICD-10-CM

## 2020-05-16 MED ORDER — DENOSUMAB 60 MG/ML ~~LOC~~ SOSY
60.0000 mg | PREFILLED_SYRINGE | Freq: Once | SUBCUTANEOUS | Status: AC
  Administered 2020-05-16: 60 mg via SUBCUTANEOUS

## 2020-05-29 DIAGNOSIS — R5383 Other fatigue: Secondary | ICD-10-CM | POA: Diagnosis not present

## 2020-05-29 DIAGNOSIS — M316 Other giant cell arteritis: Secondary | ICD-10-CM | POA: Diagnosis not present

## 2020-05-29 DIAGNOSIS — M0609 Rheumatoid arthritis without rheumatoid factor, multiple sites: Secondary | ICD-10-CM | POA: Diagnosis not present

## 2020-05-29 DIAGNOSIS — Z111 Encounter for screening for respiratory tuberculosis: Secondary | ICD-10-CM | POA: Diagnosis not present

## 2020-06-24 ENCOUNTER — Other Ambulatory Visit: Payer: Self-pay | Admitting: Nurse Practitioner

## 2020-06-24 DIAGNOSIS — E559 Vitamin D deficiency, unspecified: Secondary | ICD-10-CM

## 2020-06-26 DIAGNOSIS — M316 Other giant cell arteritis: Secondary | ICD-10-CM | POA: Diagnosis not present

## 2020-07-08 ENCOUNTER — Other Ambulatory Visit: Payer: Self-pay | Admitting: Cardiovascular Disease

## 2020-07-24 DIAGNOSIS — M316 Other giant cell arteritis: Secondary | ICD-10-CM | POA: Diagnosis not present

## 2020-07-24 DIAGNOSIS — H524 Presbyopia: Secondary | ICD-10-CM | POA: Diagnosis not present

## 2020-07-24 DIAGNOSIS — H353132 Nonexudative age-related macular degeneration, bilateral, intermediate dry stage: Secondary | ICD-10-CM | POA: Diagnosis not present

## 2020-07-24 LAB — HM DIABETES EYE EXAM

## 2020-08-18 ENCOUNTER — Other Ambulatory Visit: Payer: Self-pay | Admitting: Nurse Practitioner

## 2020-08-18 DIAGNOSIS — K219 Gastro-esophageal reflux disease without esophagitis: Secondary | ICD-10-CM

## 2020-08-21 DIAGNOSIS — M0609 Rheumatoid arthritis without rheumatoid factor, multiple sites: Secondary | ICD-10-CM | POA: Diagnosis not present

## 2020-08-21 DIAGNOSIS — M316 Other giant cell arteritis: Secondary | ICD-10-CM | POA: Diagnosis not present

## 2020-09-02 ENCOUNTER — Other Ambulatory Visit: Payer: Medicare Other

## 2020-09-02 ENCOUNTER — Other Ambulatory Visit: Payer: Self-pay

## 2020-09-02 DIAGNOSIS — I10 Essential (primary) hypertension: Secondary | ICD-10-CM

## 2020-09-02 DIAGNOSIS — E0865 Diabetes mellitus due to underlying condition with hyperglycemia: Secondary | ICD-10-CM

## 2020-09-02 DIAGNOSIS — M810 Age-related osteoporosis without current pathological fracture: Secondary | ICD-10-CM

## 2020-09-02 DIAGNOSIS — E1169 Type 2 diabetes mellitus with other specified complication: Secondary | ICD-10-CM

## 2020-09-02 DIAGNOSIS — E782 Mixed hyperlipidemia: Secondary | ICD-10-CM

## 2020-09-02 DIAGNOSIS — E559 Vitamin D deficiency, unspecified: Secondary | ICD-10-CM | POA: Diagnosis not present

## 2020-09-03 ENCOUNTER — Other Ambulatory Visit: Payer: Self-pay | Admitting: Cardiovascular Disease

## 2020-09-03 DIAGNOSIS — I739 Peripheral vascular disease, unspecified: Secondary | ICD-10-CM

## 2020-09-03 NOTE — Telephone Encounter (Signed)
PAD indication, refill sent in

## 2020-09-03 NOTE — Telephone Encounter (Signed)
Prescription refill request for Xarelto 2.'5mg'$  I cannot refill for this dose so I will route to pharmd pool Last office visit:berry 04/18/20 Weight:49.4kg Age:60fScr:1.02 09/02/20 CrCl:38.3

## 2020-09-05 ENCOUNTER — Ambulatory Visit (INDEPENDENT_AMBULATORY_CARE_PROVIDER_SITE_OTHER): Payer: Medicare Other | Admitting: Nurse Practitioner

## 2020-09-05 ENCOUNTER — Encounter: Payer: Self-pay | Admitting: Nurse Practitioner

## 2020-09-05 ENCOUNTER — Other Ambulatory Visit: Payer: Self-pay

## 2020-09-05 VITALS — BP 120/82 | HR 83 | Temp 96.9°F | Ht 61.0 in | Wt 104.0 lb

## 2020-09-05 DIAGNOSIS — I25118 Atherosclerotic heart disease of native coronary artery with other forms of angina pectoris: Secondary | ICD-10-CM

## 2020-09-05 DIAGNOSIS — E0822 Diabetes mellitus due to underlying condition with diabetic chronic kidney disease: Secondary | ICD-10-CM

## 2020-09-05 DIAGNOSIS — I10 Essential (primary) hypertension: Secondary | ICD-10-CM | POA: Diagnosis not present

## 2020-09-05 DIAGNOSIS — M810 Age-related osteoporosis without current pathological fracture: Secondary | ICD-10-CM | POA: Diagnosis not present

## 2020-09-05 DIAGNOSIS — E559 Vitamin D deficiency, unspecified: Secondary | ICD-10-CM | POA: Diagnosis not present

## 2020-09-05 DIAGNOSIS — F320 Major depressive disorder, single episode, mild: Secondary | ICD-10-CM

## 2020-09-05 DIAGNOSIS — N1831 Chronic kidney disease, stage 3a: Secondary | ICD-10-CM

## 2020-09-05 DIAGNOSIS — Z95828 Presence of other vascular implants and grafts: Secondary | ICD-10-CM | POA: Diagnosis not present

## 2020-09-05 DIAGNOSIS — E781 Pure hyperglyceridemia: Secondary | ICD-10-CM | POA: Diagnosis not present

## 2020-09-05 DIAGNOSIS — G4700 Insomnia, unspecified: Secondary | ICD-10-CM | POA: Diagnosis not present

## 2020-09-05 NOTE — Progress Notes (Signed)
Careteam: Patient Care Team: Anne Chandler, NP as PCP - General (Geriatric Medicine) Anne Harp, MD as PCP - Cardiology (Cardiology) Anne Schmidt, MD as Consulting Physician (Ophthalmology)  PLACE OF SERVICE:  [place] Directive information Does Patient Have a Medical Advance Directive?: Yes, Type of Advance Directive: Living will, Does patient want to make changes to medical advance directive?: No - Patient declined  Allergies  Allergen Reactions   Benadryl [Diphenhydramine]     Side effects per records received from [facility] Rheumatology    Lipitor [Atorvastatin]     rash   Tricor [Fenofibrate]     rash    Chief Complaint  Patient presents with   Medical Management of Chronic Issues    4 month follow-up and discuss labs. Discuss need for covid vaccine or exclude. Patient c/o dark yellow urine, no urinary symptoms. Patient has at home blood sugar readings. Here with daughter Anne Pena      HPI: Patient is a 86 y.o. female for routine follow up.   Ongoing follow up with rheumatology, her PA left and she is planning to see a new PA her next visit for GCA. She has infusion monthly.   Htn- always high on first check.   Dm- no low blood sugar. Doing well on current metformin.  Depression- ongoing. Does not drive. She does not do much in the community (moved here to live with daughter). Most of her friends live out of town.   Review of Systems:  Review of Systems  Constitutional:  Positive for weight loss. Negative for chills and fever.  HENT:  Negative for tinnitus.   Respiratory:  Negative for cough, sputum production and shortness of breath.   Cardiovascular:  Negative for chest pain, palpitations and leg swelling.  Gastrointestinal:  Negative for abdominal pain, constipation, diarrhea and heartburn.  Genitourinary:  Negative for dysuria, frequency and urgency.  Musculoskeletal:  Negative for back pain, falls, joint pain and myalgias.  Skin:  Negative.   Neurological:  Negative for dizziness and headaches.  Psychiatric/Behavioral:  Positive for depression. Negative for memory loss. The patient does not have insomnia.    Past Medical History:  Diagnosis Date   Atherosclerosis of aorta [facility])    s/p AO bypass 2004, Per records received from [facility] Rheumatology    Bladder cancer [facility]) 02/10/2010   CAD (coronary artery disease)    Coronary intervention [facility] in 2000.   Diabetes mellitus without complication ([place])    Former tobacco use    Per records received from [place] (giant cell arteritis) ([place])    Giant cell arteritis ([place])    Per records received from [facility] Rheumatology    Hyperlipidemia    Hypertension    Osteoporosis    Prolia injection administered on 05/09/2019, Per records received from [facility] Rheumatology    PAD (peripheral artery disease) [facility])    s/p bilateral iliac-femoral artery bypass 2000, Per records received from [facility] Rheumatology    Peripheral vascular disease of lower extremity ([place])    aortobifemoral bypass grafting at [facility] in [place] in 2004.    Presence of stent in left circumflex coronary artery    Rheumatoid arthritis [facility])    Per records received from [facility] Rheumatology    Past Surgical History:  Procedure Laterality Date   AORTO-FEMORAL BYPASS GRAFT     aortobifemoral bypass grafting at [facility] in [place] in 2004.    CATARACT EXTRACTION     HERNIA REPAIR  KIDNEY SURGERY  04/06/2011   URETER SURGERY  05/05/2011   Social History:   reports that she quit smoking about 9 years ago. Her smoking use included cigarettes. She has a 64.00 pack-year smoking history. She has never used smokeless tobacco. She reports current alcohol use. She reports that she does not use drugs.  Family History  Problem Relation Age of Onset   Stroke Mother    Hypertension Mother        Per records received from [facility] Rheumatology    Lung cancer  Father    Heart attack Brother    Hyperlipidemia Brother    Hypertension Brother    Diabetes Maternal Uncle    Diabetes Maternal Grandmother    Hypertension Son        Per records received from [place] [facility]    Medications: Patient's Medications  New Prescriptions   No medications on file  Previous Medications   ASCORBIC ACID (VITAMIN C PO)    Take 1 tablet by mouth as directed. Seasonally late fall- winter   B COMPLEX VITAMINS TABLET    Take 1 tablet by mouth daily.   CALCIUM CARBONATE (CALCIUM 500 PO)    Take 1 tablet by mouth in the morning and at bedtime. Only 2 weeks prior to Prolia injection and 2 weeks after   DENOSUMAB (PROLIA) 60 MG/ML SOSY INJECTION    Inject 60 mg into the skin every 6 (six) months.   GLUCOSE BLOOD TEST STRIP    Use to test blood sugar once daily. Dx:E11.69   LOSARTAN (COZAAR) 100 MG TABLET    TAKE 1 TABLET BY MOUTH DAILY. (PRELACES VALSARTAN THAT WAS ON BACKORDER)   METFORMIN (GLUCOPHAGE) 500 MG TABLET    To use 1000 mg by mouth daily in the morning and 500 mg with the evening meal.   METOPROLOL TARTRATE (LOPRESSOR) 25 MG TABLET    TAKE 1 TABLET BY MOUTH IN THE MORNING AND 1/2 IN THE EVENING   MIRTAZAPINE (REMERON) 15 MG TABLET    Take 15 mg by mouth at bedtime.   MULTIPLE VITAMINS-MINERALS (ICAPS AREDS 2) CAPS    Take 2 capsules by mouth daily. Every morning and every evening   MULTIPLE VITAMINS-MINERALS (ZINC PO)    Take 1 tablet by mouth as directed. Seasonally late fall-winter with vit c   OMEPRAZOLE (PRILOSEC) 20 MG CAPSULE    TAKE 1 CAPSULE (20 MG TOTAL) BY MOUTH DAILY.   ROSUVASTATIN (CRESTOR) 10 MG TABLET    TAKE 1 TABLET BY MOUTH DAILY.   TOCILIZUMAB (ACTEMRA IV)    Inject into the vein every 30 (thirty) days. Administered by rheumatologist for autoimmune disease   VITAMIN D, ERGOCALCIFEROL, (DRISDOL) 1.25 MG (50000 UNIT) CAPS CAPSULE    TAKE 1 CAPSULE BY MOUTH EVERY 7 DAYS.   XARELTO 2.5 MG TABS TABLET    TAKE 1 TABLET BY MOUTH 2 TIMES  DAILY  Modified Medications   No medications on file  Discontinued Medications   MIRTAZAPINE (REMERON) 15 MG TABLET    Take 2 tablets (30 mg total) by mouth at bedtime.    Physical Exam:  Vitals:   09/05/20 1547 09/05/20 1631  BP: (!) 142/78 120/82  Pulse: 83   Temp: (!) 96.9 F (36.1 C)   TempSrc: Temporal   SpO2: 99%   Weight: 104 lb (47.2 kg)   Height: '5\' 1"'$  (1.549 m)    Body mass index is 19.65 kg/m. Wt Readings from Last 3 Encounters:  09/05/20 104 lb (47.2  kg)  04/23/20 109 lb (49.4 kg)  04/18/20 110 lb (49.9 kg)    Physical Exam Constitutional:      General: She is not in acute distress.    Appearance: She is well-developed. She is not diaphoretic.  HENT:     Head: Normocephalic and atraumatic.     Mouth/Throat:     Pharynx: No oropharyngeal exudate.  Eyes:     Conjunctiva/sclera: Conjunctivae normal.     Pupils: Pupils are equal, round, and reactive to light.  Cardiovascular:     Rate and Rhythm: Normal rate and regular rhythm.     Heart sounds: Normal heart sounds.  Pulmonary:     Effort: Pulmonary effort is normal.     Breath sounds: Normal breath sounds.  Abdominal:     General: Bowel sounds are normal.     Palpations: Abdomen is soft.  Musculoskeletal:     Cervical back: Normal range of motion and neck supple.     Right lower leg: No edema.     Left lower leg: No edema.  Skin:    General: Skin is warm and dry.  Neurological:     Mental Status: She is alert and oriented to person, place, and time. Mental status is at baseline.     Gait: Gait normal.  Psychiatric:        Mood and Affect: Mood normal.   Labs reviewed: Basic Metabolic Panel: Recent Labs    10/24/19 1600 09/02/20 0902  NA 140 140  K 4.5 4.5  CL 107 105  CO2 26 26  GLUCOSE 100 96  BUN 32* 25  CREATININE 0.94* 1.02*  CALCIUM 9.9 9.9   Liver Function Tests: Recent Labs    09/02/20 0902  AST 14  ALT 16  BILITOT 0.6  PROT 6.4   No results for input(s): LIPASE,  AMYLASE in the last 8760 hours. No results for input(s): AMMONIA in the last 8760 hours. CBC: Recent Labs    10/24/19 1600 02/28/20 0000  WBC 8.5 6.6  NEUTROABS 5,602 3.70  HGB 13.7 14.1  HCT 40.5 42  MCV 92.7  --   PLT 167 167   Lipid Panel: Recent Labs    09/02/20 0902  CHOL 155  HDL 40*  LDLCALC 79  TRIG 273*  CHOLHDL 3.9   TSH: No results for input(s): TSH in the last 8760 hours. A1C: Lab Results  Component Value Date   HGBA1C 6.1 (H) 09/02/2020     Assessment/Plan 1. Current mild episode of major depressive disorder, unspecified whether recurrent ([place]) -ongoing, continues on remeron. Encouraged to increase physical activity, plan more outings.  2. Atherosclerotic heart disease of native coronary artery with other forms of angina pectoris ([place]) Stable. Continues on xarelto per cardiology.   3. Diabetes mellitus due to underlying condition with stage 3a chronic kidney disease, without long-term current use of insulin (HCC) -A1c at goal. No hypoglycemia. Encouraged dietary compliance, routine foot care/monitoring and to keep up with diabetic eye exams through ophthalmology   4. Age-related osteoporosis without current pathological fracture -continues on prolia and vit D and cal, weight bearing exercises encouraged.  5. Insomnia, unspecified type Stable at this time.   6. Hx of aorto-femoral bypass Followed by cardiology, continues on xarelto.   7. Htn  controlled on losartan and metoprolol. Continue low sodium diet.   8. Hyperlipidemia -continues on crestor. LDL at goal. Triglycerides elevated but at baseline. Continue dietary modifications with medication management.   9. Vit d def -will  follow up Vit D level.    Next appt: 6 months labs prior  Anne Pena, [facility] [phone]

## 2020-09-09 LAB — COMPLETE METABOLIC PANEL WITH GFR
AG Ratio: 2.4 (calc) (ref 1.0–2.5)
ALT: 16 U/L (ref 6–29)
AST: 14 U/L (ref 10–35)
Albumin: 4.5 g/dL (ref 3.6–5.1)
Alkaline phosphatase (APISO): 37 U/L (ref 37–153)
BUN/Creatinine Ratio: 25 (calc) — ABNORMAL HIGH (ref 6–22)
BUN: 25 mg/dL (ref 7–25)
CO2: 26 mmol/L (ref 20–32)
Calcium: 9.9 mg/dL (ref 8.6–10.4)
Chloride: 105 mmol/L (ref 98–110)
Creat: 1.02 mg/dL — ABNORMAL HIGH (ref 0.60–0.95)
Globulin: 1.9 g/dL (calc) (ref 1.9–3.7)
Glucose, Bld: 96 mg/dL (ref 65–99)
Potassium: 4.5 mmol/L (ref 3.5–5.3)
Sodium: 140 mmol/L (ref 135–146)
Total Bilirubin: 0.6 mg/dL (ref 0.2–1.2)
Total Protein: 6.4 g/dL (ref 6.1–8.1)
eGFR: 55 mL/min/{1.73_m2} — ABNORMAL LOW (ref >=60)

## 2020-09-09 LAB — LIPID PANEL
Cholesterol: 155 mg/dL (ref <200)
HDL: 40 mg/dL — ABNORMAL LOW (ref >=50)
LDL Cholesterol (Calc): 79 mg/dL (calc)
Non-HDL Cholesterol (Calc): 115 mg/dL (calc) (ref <130)
Total CHOL/HDL Ratio: 3.9 (calc) (ref <5.0)
Triglycerides: 273 mg/dL — ABNORMAL HIGH (ref <150)

## 2020-09-09 LAB — VITAMIN D 25 HYDROXY (VIT D DEFICIENCY, FRACTURES): Vit D, 25-Hydroxy: >150 ng/mL — ABNORMAL HIGH (ref 30–100)

## 2020-09-09 LAB — HEMOGLOBIN A1C
Hgb A1c MFr Bld: 6.1 % of total Hgb — ABNORMAL HIGH (ref <5.7)
Mean Plasma Glucose: 128 mg/dL
eAG (mmol/L): 7.1 mmol/L

## 2020-09-09 LAB — TEST AUTHORIZATION

## 2020-09-09 NOTE — Progress Notes (Signed)
Called daughter to review Vit d which is high. To stop Vitamin D 50000 units at this time. To start Vit D 1000 units by mouth after 2 weeks.

## 2020-09-17 DIAGNOSIS — Z6820 Body mass index (BMI) 20.0-20.9, adult: Secondary | ICD-10-CM | POA: Diagnosis not present

## 2020-09-17 DIAGNOSIS — M316 Other giant cell arteritis: Secondary | ICD-10-CM | POA: Diagnosis not present

## 2020-09-17 DIAGNOSIS — Z79899 Other long term (current) drug therapy: Secondary | ICD-10-CM | POA: Diagnosis not present

## 2020-09-17 DIAGNOSIS — M0609 Rheumatoid arthritis without rheumatoid factor, multiple sites: Secondary | ICD-10-CM | POA: Diagnosis not present

## 2020-09-17 DIAGNOSIS — R5382 Chronic fatigue, unspecified: Secondary | ICD-10-CM | POA: Diagnosis not present

## 2020-09-17 DIAGNOSIS — D508 Other iron deficiency anemias: Secondary | ICD-10-CM | POA: Diagnosis not present

## 2020-09-17 DIAGNOSIS — M810 Age-related osteoporosis without current pathological fracture: Secondary | ICD-10-CM | POA: Diagnosis not present

## 2020-09-24 DIAGNOSIS — M316 Other giant cell arteritis: Secondary | ICD-10-CM | POA: Diagnosis not present

## 2020-09-24 DIAGNOSIS — M0609 Rheumatoid arthritis without rheumatoid factor, multiple sites: Secondary | ICD-10-CM | POA: Diagnosis not present

## 2020-10-06 ENCOUNTER — Other Ambulatory Visit: Payer: Self-pay | Admitting: Nurse Practitioner

## 2020-10-06 DIAGNOSIS — E1169 Type 2 diabetes mellitus with other specified complication: Secondary | ICD-10-CM

## 2020-10-16 ENCOUNTER — Other Ambulatory Visit: Payer: Self-pay

## 2020-10-16 ENCOUNTER — Ambulatory Visit
Admission: RE | Admit: 2020-10-16 | Discharge: 2020-10-16 | Disposition: A | Discharge status: Home / Self Care | Payer: Medicare Other | Source: Ambulatory Visit | Attending: Nurse Practitioner | Admitting: Nurse Practitioner

## 2020-10-16 DIAGNOSIS — M810 Age-related osteoporosis without current pathological fracture: Secondary | ICD-10-CM | POA: Diagnosis not present

## 2020-10-16 DIAGNOSIS — E2839 Other primary ovarian failure: Secondary | ICD-10-CM

## 2020-10-16 DIAGNOSIS — Z780 Asymptomatic menopausal state: Secondary | ICD-10-CM | POA: Diagnosis not present

## 2020-10-23 DIAGNOSIS — M0609 Rheumatoid arthritis without rheumatoid factor, multiple sites: Secondary | ICD-10-CM | POA: Diagnosis not present

## 2020-10-23 DIAGNOSIS — M316 Other giant cell arteritis: Secondary | ICD-10-CM | POA: Diagnosis not present

## 2020-11-03 ENCOUNTER — Other Ambulatory Visit: Payer: Self-pay | Admitting: Cardiovascular Disease

## 2020-11-05 ENCOUNTER — Other Ambulatory Visit: Payer: Self-pay | Admitting: Cardiovascular Disease

## 2020-11-14 ENCOUNTER — Ambulatory Visit: Payer: Medicare Other | Admitting: Nurse Practitioner

## 2020-11-14 ENCOUNTER — Other Ambulatory Visit: Payer: Self-pay | Admitting: Cardiovascular Disease

## 2020-11-14 DIAGNOSIS — Z23 Encounter for immunization: Secondary | ICD-10-CM | POA: Diagnosis not present

## 2020-11-17 ENCOUNTER — Ambulatory Visit: Payer: Medicare Other

## 2020-11-19 ENCOUNTER — Ambulatory Visit (INDEPENDENT_AMBULATORY_CARE_PROVIDER_SITE_OTHER): Payer: Medicare Other | Admitting: *Deleted

## 2020-11-19 ENCOUNTER — Other Ambulatory Visit: Payer: Self-pay

## 2020-11-19 DIAGNOSIS — M810 Age-related osteoporosis without current pathological fracture: Secondary | ICD-10-CM

## 2020-11-19 MED ORDER — DENOSUMAB 60 MG/ML ~~LOC~~ SOSY
60.0000 mg | PREFILLED_SYRINGE | Freq: Once | SUBCUTANEOUS | Status: AC
  Administered 2020-11-19: 60 mg via SUBCUTANEOUS

## 2020-11-20 DIAGNOSIS — M316 Other giant cell arteritis: Secondary | ICD-10-CM | POA: Diagnosis not present

## 2020-11-20 DIAGNOSIS — Z79899 Other long term (current) drug therapy: Secondary | ICD-10-CM | POA: Diagnosis not present

## 2020-11-20 DIAGNOSIS — M0609 Rheumatoid arthritis without rheumatoid factor, multiple sites: Secondary | ICD-10-CM | POA: Diagnosis not present

## 2020-11-24 ENCOUNTER — Other Ambulatory Visit (HOSPITAL_BASED_OUTPATIENT_CLINIC_OR_DEPARTMENT_OTHER): Payer: Self-pay | Admitting: Cardiovascular Disease

## 2020-11-24 DIAGNOSIS — Z95828 Presence of other vascular implants and grafts: Secondary | ICD-10-CM

## 2020-12-18 DIAGNOSIS — Z13220 Encounter for screening for lipoid disorders: Secondary | ICD-10-CM | POA: Diagnosis not present

## 2020-12-18 DIAGNOSIS — Z79899 Other long term (current) drug therapy: Secondary | ICD-10-CM | POA: Diagnosis not present

## 2020-12-18 DIAGNOSIS — M0609 Rheumatoid arthritis without rheumatoid factor, multiple sites: Secondary | ICD-10-CM | POA: Diagnosis not present

## 2020-12-18 DIAGNOSIS — M316 Other giant cell arteritis: Secondary | ICD-10-CM | POA: Diagnosis not present

## 2020-12-30 ENCOUNTER — Other Ambulatory Visit: Payer: Self-pay | Admitting: Nurse Practitioner

## 2020-12-30 DIAGNOSIS — K219 Gastro-esophageal reflux disease without esophagitis: Secondary | ICD-10-CM

## 2021-01-09 ENCOUNTER — Ambulatory Visit (INDEPENDENT_AMBULATORY_CARE_PROVIDER_SITE_OTHER): Payer: Medicare Other | Admitting: Nurse Practitioner

## 2021-01-09 ENCOUNTER — Telehealth: Payer: Self-pay

## 2021-01-09 ENCOUNTER — Encounter: Payer: Self-pay | Admitting: Nurse Practitioner

## 2021-01-09 ENCOUNTER — Other Ambulatory Visit: Payer: Self-pay

## 2021-01-09 DIAGNOSIS — Z0000 Encounter for general adult medical examination without abnormal findings: Secondary | ICD-10-CM | POA: Diagnosis not present

## 2021-01-09 NOTE — Progress Notes (Signed)
Subjective:   Anne Pena is a 86 y.o. female who presents for Medicare Annual (Subsequent) preventive examination.  Review of Systems     Cardiac Risk Factors include: advanced age (>61men, >58 women);diabetes mellitus;family history of premature cardiovascular disease;hypertension;dyslipidemia;sedentary lifestyle     Objective:    There were no vitals filed for this visit. There is no height or weight on file to calculate BMI.  Advanced Directives 01/09/2021 09/05/2020 04/23/2020 10/24/2019 09/04/2019 07/11/2019  Does Patient Have a Medical Advance Directive? Yes Yes Yes Yes Yes Yes  Type of Advance Directive Living will Living will [place];Living will Living will Living will Living will  Does patient want to make changes to medical advance directive? No - Patient declined No - Patient declined No - Patient declined No - Patient declined No - Patient declined No - Patient declined  Copy of [place] in Chart? - - Yes - validated most recent copy scanned in chart (See row information) - - -    Current Medications (verified) Outpatient Encounter Medications as of 01/09/2021  Medication Sig   Ascorbic Acid (VITAMIN C PO) Take 1 tablet by mouth as directed. Seasonally late fall- winter   b complex vitamins tablet Take 1 tablet by mouth daily.   Calcium Carbonate (CALCIUM 500 PO) Take 1 tablet by mouth in the morning and at bedtime. Only 2 weeks prior to Prolia injection and 2 weeks after   Cholecalciferol (VITAMIN D3) 50 MCG (2000 UT) TABS Take 1 tablet by mouth every other day.   denosumab (PROLIA) 60 MG/ML SOSY injection Inject 60 mg into the skin every 6 (six) months.   glucose blood test strip Use to test blood sugar once daily. Dx:E11.69   losartan (COZAAR) 100 MG tablet TAKE 1 TABLET BY MOUTH DAILY.   metFORMIN (GLUCOPHAGE) 500 MG tablet TAKE 2 TABLETS BY MOUTH DAILY IN THE MORNING AND 1 TABLET WITH EVENING MEAL   metoprolol tartrate  (LOPRESSOR) 25 MG tablet TAKE 1 TABLET BY MOUTH IN THE MORNING AND 1/2 TABLET IN THE EVENING   mirtazapine (REMERON) 15 MG tablet Take 15 mg by mouth at bedtime.   Multiple Vitamins-Minerals (ICAPS AREDS 2) CAPS Take 2 capsules by mouth daily. Every morning and every evening   Multiple Vitamins-Minerals (WOMENS MULTI GUMMIES PO) Take 1 tablet by mouth in the morning and at bedtime.   omeprazole (PRILOSEC) 20 MG capsule TAKE 1 CAPSULE (20 MG TOTAL) BY MOUTH DAILY.   rosuvastatin (CRESTOR) 10 MG tablet TAKE 1 TABLET BY MOUTH DAILY.   Tocilizumab (ACTEMRA IV) Inject into the vein every 30 (thirty) days. Administered by rheumatologist for autoimmune disease   XARELTO 2.5 MG TABS tablet TAKE 1 TABLET BY MOUTH 2 TIMES DAILY   [DISCONTINUED] Multiple Vitamins-Minerals (ZINC PO) Take 1 tablet by mouth as directed. Seasonally late fall-winter with vit c   [DISCONTINUED] Vitamin D, Ergocalciferol, (DRISDOL) 1.25 MG (50000 UNIT) CAPS capsule TAKE 1 CAPSULE BY MOUTH EVERY 7 DAYS. (Patient not taking: Reported on 01/09/2021)   No facility-administered encounter medications on file as of 01/09/2021.    Allergies (verified) Benadryl [diphenhydramine], Lipitor [atorvastatin], and Tricor [fenofibrate]   History: Past Medical History:  Diagnosis Date   Atherosclerosis of aorta ([place])    s/p AO bypass 2004, Per records received from [facility] Rheumatology    Bladder cancer [facility]) 02/10/2010   CAD (coronary artery disease)    Coronary intervention [facility] in 2000.   Diabetes mellitus without complication ([place])  Former tobacco use    Per records received from [facility] (giant cell arteritis) ([place])    Giant cell arteritis ([place])    Per records received from [facility] Rheumatology    Hyperlipidemia    Hypertension    Osteoporosis    Prolia injection administered on 05/09/2019, Per records received from [facility]ology    PAD (peripheral artery disease) [facility])    s/p bilateral  iliac-femoral artery bypass 2000, Per records received from [facility] Rheumatology    Peripheral vascular disease of lower extremity ([place])    aortobifemoral bypass grafting at [facility] in [place] in 2004.    Presence of stent in left circumflex coronary artery    Rheumatoid arthritis [facility])    Per records received from [facility] Rheumatology    Past Surgical History:  Procedure Laterality Date   AORTO-FEMORAL BYPASS GRAFT     aortobifemoral bypass grafting at [facility] in [place] in 2004.    CATARACT EXTRACTION     HERNIA REPAIR     KIDNEY SURGERY  04/06/2011   URETER SURGERY  05/05/2011   Family History  Problem Relation Age of Onset   Stroke Mother    Hypertension Mother        Per records received from [facility] Rheumatology    Lung cancer Father    Heart attack Brother    Hyperlipidemia Brother    Hypertension Brother    Diabetes Maternal Uncle    Diabetes Maternal Grandmother    Hypertension Son        Per records received from [place] History   Socioeconomic History   Marital status: Widowed    Spouse name: Not on file   Number of children: Not on file   Years of education: Not on file   Highest education level: Not on file  Occupational History   Not on file  Tobacco Use   Smoking status: Former    Packs/day: 1.00    Years: 64.00    Pack years: 64.00    Types: Cigarettes    Quit date: 04/06/2011    Years since quitting: 9.7   Smokeless tobacco: Never  Vaping Use   Vaping Use: Never used  Substance and Sexual Activity   Alcohol use: Yes    Comment: Rarely    Drug use: Never   Sexual activity: Not Currently  Other Topics Concern   Not on file  Social History Narrative   Not on file   Social Determinants of Health   Financial Resource Strain: Not on file  Food Insecurity: Not on file  Transportation Needs: Not on file  Physical Activity: Not on file  Stress: Not on file  Social Connections: Not on file     Tobacco Counseling Counseling given: Not Answered   Clinical Intake:  Pre-visit preparation completed: Yes  Pain : No/denies pain     BMI - recorded: 20 Nutritional Status: BMI of 19-24  Normal Nutritional Risks: None Diabetes: Yes  How often do you need to have someone help you when you read instructions, pamphlets, or other written materials from your doctor or pharmacy?: 1 - Never  Diabetic?yes         Activities of Daily Living In your present state of health, do you have any difficulty performing the following activities: 01/09/2021  Hearing? N  Vision? Y  Difficulty concentrating or making decisions? Y  Comment has trouble remembering names  Walking or climbing stairs? N  Dressing or  bathing? N  Doing errands, shopping? Y  Comment does not Physiological scientist and eating ? N  Using the Toilet? N  In the past six months, have you accidently leaked urine? Y  Do you have problems with loss of bowel control? N  Managing your Medications? N  Managing your Finances? N  Housekeeping or managing your Housekeeping? N  Some recent data might be hidden    Patient Care Team: [name], NP as PCP - General (Geriatric Medicine) [name], MD as PCP - Cardiology (Cardiology) [name], MD as Consulting Physician (Ophthalmology)  Indicate any recent Medical Services you may have received from other than [facility] providers in the past year (date may be approximate).     Assessment:   This is a routine wellness examination for [place].  Hearing/Vision screen Hearing Screening - Comments:: No hearing issues  Vision Screening - Comments:: Last eye exam June 2022, Dr. Valetta Close   Dietary issues and exercise activities discussed: Current Exercise Habits: Home exercise routine, Type of exercise: Other - see comments;calisthenics (stationary bike.), Time (Minutes): 10, Frequency (Times/Week): 5, Weekly Exercise (Minutes/Week): 50   Goals Addressed    None    Depression Screen PHQ 2/9 Scores 01/09/2021 04/23/2020 09/04/2019 07/11/2019 12/19/2018 12/08/2017 12/08/2017  PHQ - 2 Score 0 0 0 0 2 1 1   PHQ- 9 Score - - - - 3 5 -    Fall Risk Fall Risk  01/09/2021 09/05/2020 04/23/2020 10/24/2019 09/04/2019  Falls in the past year? 0 0 0 0 0  Number falls in past yr: 0 0 0 0 0  Injury with Fall? 0 0 0 0 0  Risk Factor Category  - - - - -  Risk for fall due to : No Fall Risks No Fall Risks - - -  Follow up Falls evaluation completed Falls evaluation completed - - -    FALL RISK PREVENTION PERTAINING TO THE HOME:  Any stairs in or around the home? Yes  If so, are there any without handrails? No  Home free of loose throw rugs in walkways, pet beds, electrical cords, etc? Yes  Adequate lighting in your home to reduce risk of falls? Yes   ASSISTIVE DEVICES UTILIZED TO PREVENT FALLS:  Life alert? No  Use of a cane, walker or w/c? Yes  Grab bars in the bathroom? No  Shower chair or bench in shower? No  Elevated toilet seat or a handicapped toilet? Yes   TIMED UP AND GO:  Was the test performed? No .    Cognitive Function:     6CIT Screen 01/09/2021 09/04/2019 12/19/2018  What Year? 0 points 0 points 0 points  What month? 0 points 0 points 0 points  What time? 0 points 0 points 0 points  Count back from 20 0 points 0 points 0 points  Months in reverse 0 points 0 points 0 points  Repeat phrase 0 points 0 points 0 points  Total Score 0 0 0    Immunizations Immunization History  Administered Date(s) Administered   Fluad Quad(high Dose 65+) 10/24/2019   Influenza-Unspecified 10/18/2016, 12/08/2018   Moderna Sars-Covid-2 Vaccination 02/21/2019, 03/21/2019, 11/22/2019   Pneumococcal Polysaccharide-23 09/17/2019   Tdap 09/17/2019   Zoster Recombinat (Shingrix) 09/17/2019, 11/22/2019    TDAP status: Up to date  Flu Vaccine status: Up to date  Pneumococcal vaccine status: Up to date  Covid-19 vaccine status: Information provided  on how to obtain vaccines.   Qualifies for Shingles Vaccine?  Yes   Zostavax completed No   Shingrix Completed?: Yes  Screening Tests Health Maintenance  Topic Date Due   COVID-19 Vaccine (4 - Booster for Moderna series) 01/17/2020   INFLUENZA VACCINE  09/08/2020   Pneumonia Vaccine 49+ Years old (2 - PCV) 09/16/2020   FOOT EXAM  10/23/2020   HEMOGLOBIN A1C  03/05/2021   OPHTHALMOLOGY EXAM  07/24/2021   TETANUS/TDAP  09/16/2029   DEXA SCAN  Completed   Zoster Vaccines- Shingrix  Completed   HPV VACCINES  Aged Out    Health Maintenance  Health Maintenance Due  Topic Date Due   COVID-19 Vaccine (4 - Booster for Moderna series) 01/17/2020   INFLUENZA VACCINE  09/08/2020   Pneumonia Vaccine 32+ Years old (2 - PCV) 09/16/2020   FOOT EXAM  10/23/2020    Colorectal cancer screening: No longer required.   Mammogram status: No longer required due to aged out.  Bone Density status: Completed 10/16/20. Results reflect: Bone density results: OSTEOPOROSIS. Repeat every 2 years.  Lung Cancer Screening: (Low Dose CT Chest recommended if Age 56-80 years, 30 pack-year currently smoking OR have quit w/in 15years.) does not qualify.   Lung Cancer Screening Referral: na  Additional Screening:  Hepatitis C Screening: does not qualify  Vision Screening: Recommended annual ophthalmology exams for early detection of glaucoma and other disorders of the eye. Is the patient up to date with their annual eye exam?  Yes  Who is the provider or what is the name of the office in which the patient attends annual eye exams? Dr Valetta Close If pt is not established with a provider, would they like to be referred to a provider to establish care? No .   Dental Screening: Recommended annual dental exams for proper oral hygiene  Community Resource Referral / Chronic Care Management: CRR required this visit?  No   CCM required this visit?  No      Plan:     I have personally reviewed and noted the  following in the patient's chart:   Medical and social history Use of alcohol, tobacco or illicit drugs  Current medications and supplements including opioid prescriptions.  Functional ability and status Nutritional status Physical activity Advanced directives List of other physicians Hospitalizations, surgeries, and ER visits in previous 12 months Vitals Screenings to include cognitive, depression, and falls Referrals and appointments  In addition, I have reviewed and discussed with patient certain preventive protocols, quality metrics, and best practice recommendations. A written personalized care plan for preventive services as well as general preventive health recommendations were provided to patient.     [name], NP   01/09/2021    Virtual Visit via Telephone Note  I connected withNAME@ on 01/09/21 at  1:30 PM EST by telephone and verified that I am speaking with the correct person using two identifiers.  Location: Patient: home Provider: [facility]   I discussed the limitations, risks, security and privacy concerns of performing an evaluation and management service by telephone and the availability of in person appointments. I also discussed with the patient that there may be a patient responsible charge related to this service. The patient expressed understanding and agreed to proceed.   I discussed the assessment and treatment plan with the patient. The patient was provided an opportunity to ask questions and all were answered. The patient agreed with the plan and demonstrated an understanding of the instructions.   The patient was advised to call back or seek an in-person evaluation if  the symptoms worsen or if the condition fails to improve as anticipated.  I provided 15 minutes of non-face-to-face time during this encounter.  Carlos American. Harle Battiest Avs printed and mailed

## 2021-01-09 NOTE — Progress Notes (Signed)
   This service is provided via telemedicine  No vital signs collected/recorded due to the encounter was a telemedicine visit.   Location of patient (ex: home, work):  Home  Patient consents to a telephone visit: Yes, see telephone visit dated 01/09/21   Location of the provider (ex: office, home):  [facility], Office   Name of any referring provider:  N/A  Names of all persons participating in the telemedicine service and their role in the encounter:  S.Chrae B/CMA, Sherrie Mustache, NP, [name] (daughter)and Patient   Time spent on call:  12 min with medical assistant

## 2021-01-09 NOTE — Telephone Encounter (Signed)
Anne Pena, Anne Pena are scheduled for a virtual visit with your provider today.    Just as we do with appointments in the office, we must obtain your consent to participate.  Your consent will be active for this visit and any virtual visit you may have with one of our providers in the next 365 days.    If you have a MyChart account, I can also send a copy of this consent to you electronically.  All virtual visits are billed to your insurance company just like a traditional visit in the office.  As this is a virtual visit, video technology does not allow for your provider to perform a traditional examination.  This may limit your provider's ability to fully assess your condition.  If your provider identifies any concerns that need to be evaluated in person or the need to arrange testing such as labs, EKG, etc, we will make arrangements to do so.    Although advances in technology are sophisticated, we cannot ensure that it will always work on either your end or our end.  If the connection with a video visit is poor, we may have to switch to a telephone visit.  With either a video or telephone visit, we are not always able to ensure that we have a secure connection.   I need to obtain your verbal consent now.   Are you willing to proceed with your visit today?   Anne Pena has provided verbal consent on 01/09/2021 for a virtual visit (video or telephone).   [place], [place] 01/09/2021  1:37 PM

## 2021-01-09 NOTE — Patient Instructions (Addendum)
Anne Pena , Thank you for taking time to come for your Medicare Wellness Visit. I appreciate your ongoing commitment to your health goals. Please review the following plan we discussed and let me know if I can assist you in the future.   Screening recommendations/referrals: Colonoscopy Age out Mammogram aged out Bone Density up to date  Recommended yearly ophthalmology/optometry visit for glaucoma screening and checkup Recommended yearly dental visit for hygiene and checkup  Vaccinations: Influenza vaccine up to date Pneumococcal vaccine up to date Tdap vaccine up to date Shingles vaccine up to date    Advanced directives: on file  Conditions/risks identified: advance age, memory loss, fall risk  Next appointment: yearly    Preventive Care 67 Years and Older, Female Preventive care refers to lifestyle choices and visits with your health care provider that can promote health and wellness. What does preventive care include? A yearly physical exam. This is also called an annual well check. Dental exams once or twice a year. Routine eye exams. Ask your health care provider how often you should have your eyes checked. Personal lifestyle choices, including: Daily care of your teeth and gums. Regular physical activity. Eating a healthy diet. Avoiding tobacco and drug use. Limiting alcohol use. Practicing safe sex. Taking low-dose aspirin every day. Taking vitamin and mineral supplements as recommended by your health care provider. What happens during an annual well check? The services and screenings done by your health care provider during your annual well check will depend on your age, overall health, lifestyle risk factors, and family history of disease. Counseling  Your health care provider may ask you questions about your: Alcohol use. Tobacco use. Drug use. Emotional well-being. Home and relationship well-being. Sexual activity. Eating habits. History of  falls. Memory and ability to understand (cognition). Work and work Statistician. Reproductive health. Screening  You may have the following tests or measurements: Height, weight, and BMI. Blood pressure. Lipid and cholesterol levels. These may be checked every 5 years, or more frequently if you are over 51 years old. Skin check. Lung cancer screening. You may have this screening every year starting at age 70 if you have a 30-pack-year history of smoking and currently smoke or have quit within the past 15 years. Fecal occult blood test (FOBT) of the stool. You may have this test every year starting at age 71. Flexible sigmoidoscopy or colonoscopy. You may have a sigmoidoscopy every 5 years or a colonoscopy every 10 years starting at age 61. Hepatitis C blood test. Hepatitis B blood test. Sexually transmitted disease (STD) testing. Diabetes screening. This is done by checking your blood sugar (glucose) after you have not eaten for a while (fasting). You may have this done every 1-3 years. Bone density scan. This is done to screen for osteoporosis. You may have this done starting at age 73. Mammogram. This may be done every 1-2 years. Talk to your health care provider about how often you should have regular mammograms. Talk with your health care provider about your test results, treatment options, and if necessary, the need for more tests. Vaccines  Your health care provider may recommend certain vaccines, such as: Influenza vaccine. This is recommended every year. Tetanus, diphtheria, and acellular pertussis (Tdap, Td) vaccine. You may need a Td booster every 10 years. Zoster vaccine. You may need this after age 49. Pneumococcal 13-valent conjugate (PCV13) vaccine. One dose is recommended after age 74. Pneumococcal polysaccharide (PPSV23) vaccine. One dose is recommended after age 59. Talk to your  health care provider about which screenings and vaccines you need and how often you need  them. This information is not intended to replace advice given to you by your health care provider. Make sure you discuss any questions you have with your health care provider. Document Released: 02/21/2015 Document Revised: 10/15/2015 Document Reviewed: 11/26/2014 Elsevier Interactive Patient Education  2017 [place] Prevention in the Home Falls can cause injuries. They can happen to people of all ages. There are many things you can do to make your home safe and to help prevent falls. What can I do on the outside of my home? Regularly fix the edges of walkways and driveways and fix any cracks. Remove anything that might make you trip as you walk through a door, such as a raised step or threshold. Trim any bushes or trees on the path to your home. Use bright outdoor lighting. Clear any walking paths of anything that might make someone trip, such as rocks or tools. Regularly check to see if handrails are loose or broken. Make sure that both sides of any steps have handrails. Any raised decks and porches should have guardrails on the edges. Have any leaves, snow, or ice cleared regularly. Use sand or salt on walking paths during winter. Clean up any spills in your garage right away. This includes oil or grease spills. What can I do in the bathroom? Use night lights. Install grab bars by the toilet and in the tub and shower. Do not use towel bars as grab bars. Use non-skid mats or decals in the tub or shower. If you need to sit down in the shower, use a plastic, non-slip stool. Keep the floor dry. Clean up any water that spills on the floor as soon as it happens. Remove soap buildup in the tub or shower regularly. Attach bath mats securely with double-sided non-slip rug tape. Do not have throw rugs and other things on the floor that can make you trip. What can I do in the bedroom? Use night lights. Make sure that you have a light by your bed that is easy to reach. Do not use  any sheets or blankets that are too big for your bed. They should not hang down onto the floor. Have a firm chair that has side arms. You can use this for support while you get dressed. Do not have throw rugs and other things on the floor that can make you trip. What can I do in the kitchen? Clean up any spills right away. Avoid walking on wet floors. Keep items that you use a lot in easy-to-reach places. If you need to reach something above you, use a strong step stool that has a grab bar. Keep electrical cords out of the way. Do not use floor polish or wax that makes floors slippery. If you must use wax, use non-skid floor wax. Do not have throw rugs and other things on the floor that can make you trip. What can I do with my stairs? Do not leave any items on the stairs. Make sure that there are handrails on both sides of the stairs and use them. Fix handrails that are broken or loose. Make sure that handrails are as long as the stairways. Check any carpeting to make sure that it is firmly attached to the stairs. Fix any carpet that is loose or worn. Avoid having throw rugs at the top or bottom of the stairs. If you do have throw rugs, attach them  to the floor with carpet tape. Make sure that you have a light switch at the top of the stairs and the bottom of the stairs. If you do not have them, ask someone to add them for you. What else can I do to help prevent falls? Wear shoes that: Do not have high heels. Have rubber bottoms. Are comfortable and fit you well. Are closed at the toe. Do not wear sandals. If you use a stepladder: Make sure that it is fully opened. Do not climb a closed stepladder. Make sure that both sides of the stepladder are locked into place. Ask someone to hold it for you, if possible. Clearly mark and make sure that you can see: Any grab bars or handrails. First and last steps. Where the edge of each step is. Use tools that help you move around (mobility aids)  if they are needed. These include: Canes. Walkers. Scooters. Crutches. Turn on the lights when you go into a dark area. Replace any light bulbs as soon as they burn out. Set up your furniture so you have a clear path. Avoid moving your furniture around. If any of your floors are uneven, fix them. If there are any pets around you, be aware of where they are. Review your medicines with your doctor. Some medicines can make you feel dizzy. This can increase your chance of falling. Ask your doctor what other things that you can do to help prevent falls. This information is not intended to replace advice given to you by your health care provider. Make sure you discuss any questions you have with your health care provider. Document Released: 11/21/2008 Document Revised: 07/03/2015 Document Reviewed: 03/01/2014 Elsevier Interactive Patient Education  2017 [employer].

## 2021-01-23 DIAGNOSIS — M316 Other giant cell arteritis: Secondary | ICD-10-CM | POA: Diagnosis not present

## 2021-01-27 DIAGNOSIS — H353132 Nonexudative age-related macular degeneration, bilateral, intermediate dry stage: Secondary | ICD-10-CM | POA: Diagnosis not present

## 2021-02-05 DIAGNOSIS — H43813 Vitreous degeneration, bilateral: Secondary | ICD-10-CM | POA: Diagnosis not present

## 2021-02-05 DIAGNOSIS — H353132 Nonexudative age-related macular degeneration, bilateral, intermediate dry stage: Secondary | ICD-10-CM | POA: Diagnosis not present

## 2021-02-05 DIAGNOSIS — H35372 Puckering of macula, left eye: Secondary | ICD-10-CM | POA: Diagnosis not present

## 2021-02-05 DIAGNOSIS — M316 Other giant cell arteritis: Secondary | ICD-10-CM | POA: Diagnosis not present

## 2021-02-25 DIAGNOSIS — M316 Other giant cell arteritis: Secondary | ICD-10-CM | POA: Diagnosis not present

## 2021-02-25 DIAGNOSIS — M0609 Rheumatoid arthritis without rheumatoid factor, multiple sites: Secondary | ICD-10-CM | POA: Diagnosis not present

## 2021-03-03 ENCOUNTER — Other Ambulatory Visit: Payer: Self-pay

## 2021-03-03 ENCOUNTER — Other Ambulatory Visit: Payer: Medicare Other

## 2021-03-03 ENCOUNTER — Other Ambulatory Visit: Payer: Self-pay | Admitting: Cardiovascular Disease

## 2021-03-03 DIAGNOSIS — E559 Vitamin D deficiency, unspecified: Secondary | ICD-10-CM | POA: Diagnosis not present

## 2021-03-03 DIAGNOSIS — I739 Peripheral vascular disease, unspecified: Secondary | ICD-10-CM

## 2021-03-03 DIAGNOSIS — N1831 Chronic kidney disease, stage 3a: Secondary | ICD-10-CM | POA: Diagnosis not present

## 2021-03-03 DIAGNOSIS — I10 Essential (primary) hypertension: Secondary | ICD-10-CM | POA: Diagnosis not present

## 2021-03-03 DIAGNOSIS — E0822 Diabetes mellitus due to underlying condition with diabetic chronic kidney disease: Secondary | ICD-10-CM | POA: Diagnosis not present

## 2021-03-03 NOTE — Telephone Encounter (Signed)
Prescription refill request for Xarelto received.  Indication: PAD Last office visit: Gwenlyn Found 04/18/2020 Weight:47.2 kg  Age: 87 yo  Scr: 1.02, 09/02/2020 CrCl: 31 ml/min   Xarelto 2.5mg  BID

## 2021-03-04 LAB — CBC WITH DIFFERENTIAL/PLATELET
Absolute Monocytes: 752 cells/uL (ref 200–950)
Basophils Absolute: 23 cells/uL (ref 0–200)
Basophils Relative: 0.4 %
Eosinophils Absolute: 160 cells/uL (ref 15–500)
Eosinophils Relative: 2.8 %
HCT: 40.3 % (ref 35.0–45.0)
Hemoglobin: 13.7 g/dL (ref 11.7–15.5)
Lymphs Abs: 1716 cells/uL (ref 850–3900)
MCH: 31.6 pg (ref 27.0–33.0)
MCHC: 34 g/dL (ref 32.0–36.0)
MCV: 92.9 fL (ref 80.0–100.0)
MPV: 11.8 fL (ref 7.5–12.5)
Monocytes Relative: 13.2 %
Neutro Abs: 3050 cells/uL (ref 1500–7800)
Neutrophils Relative %: 53.5 %
Platelets: 152 10*3/uL (ref 140–400)
RBC: 4.34 10*6/uL (ref 3.80–5.10)
RDW: 12.2 % (ref 11.0–15.0)
Total Lymphocyte: 30.1 %
WBC: 5.7 10*3/uL (ref 3.8–10.8)

## 2021-03-04 LAB — COMPLETE METABOLIC PANEL WITH GFR
AG Ratio: 2.5 (calc) (ref 1.0–2.5)
ALT: 15 U/L (ref 6–29)
AST: 16 U/L (ref 10–35)
Albumin: 4.7 g/dL (ref 3.6–5.1)
Alkaline phosphatase (APISO): 35 U/L — ABNORMAL LOW (ref 37–153)
BUN/Creatinine Ratio: 27 (calc) — ABNORMAL HIGH (ref 6–22)
BUN: 29 mg/dL — ABNORMAL HIGH (ref 7–25)
CO2: 30 mmol/L (ref 20–32)
Calcium: 9.5 mg/dL (ref 8.6–10.4)
Chloride: 106 mmol/L (ref 98–110)
Creat: 1.08 mg/dL — ABNORMAL HIGH (ref 0.60–0.95)
Globulin: 1.9 g/dL (calc) (ref 1.9–3.7)
Glucose, Bld: 121 mg/dL — ABNORMAL HIGH (ref 65–99)
Potassium: 4.3 mmol/L (ref 3.5–5.3)
Sodium: 142 mmol/L (ref 135–146)
Total Bilirubin: 0.6 mg/dL (ref 0.2–1.2)
Total Protein: 6.6 g/dL (ref 6.1–8.1)
eGFR: 51 mL/min/{1.73_m2} — ABNORMAL LOW (ref >=60)

## 2021-03-04 LAB — HEMOGLOBIN A1C
Hgb A1c MFr Bld: 6.3 % of total Hgb — ABNORMAL HIGH (ref <5.7)
Mean Plasma Glucose: 134 mg/dL
eAG (mmol/L): 7.4 mmol/L

## 2021-03-04 LAB — VITAMIN D 25 HYDROXY (VIT D DEFICIENCY, FRACTURES): Vit D, 25-Hydroxy: 58 ng/mL (ref 30–100)

## 2021-03-06 ENCOUNTER — Other Ambulatory Visit: Payer: Self-pay

## 2021-03-06 ENCOUNTER — Encounter: Payer: Self-pay | Admitting: Nurse Practitioner

## 2021-03-06 ENCOUNTER — Ambulatory Visit (INDEPENDENT_AMBULATORY_CARE_PROVIDER_SITE_OTHER): Payer: Medicare Other | Admitting: Nurse Practitioner

## 2021-03-06 VITALS — BP 126/68 | HR 86 | Temp 97.3°F | Ht 61.0 in | Wt 105.0 lb

## 2021-03-06 DIAGNOSIS — I25118 Atherosclerotic heart disease of native coronary artery with other forms of angina pectoris: Secondary | ICD-10-CM | POA: Diagnosis not present

## 2021-03-06 DIAGNOSIS — F320 Major depressive disorder, single episode, mild: Secondary | ICD-10-CM | POA: Diagnosis not present

## 2021-03-06 DIAGNOSIS — N1831 Chronic kidney disease, stage 3a: Secondary | ICD-10-CM | POA: Diagnosis not present

## 2021-03-06 DIAGNOSIS — M810 Age-related osteoporosis without current pathological fracture: Secondary | ICD-10-CM

## 2021-03-06 DIAGNOSIS — E0822 Diabetes mellitus due to underlying condition with diabetic chronic kidney disease: Secondary | ICD-10-CM

## 2021-03-06 DIAGNOSIS — I739 Peripheral vascular disease, unspecified: Secondary | ICD-10-CM | POA: Diagnosis not present

## 2021-03-06 DIAGNOSIS — M316 Other giant cell arteritis: Secondary | ICD-10-CM | POA: Diagnosis not present

## 2021-03-06 DIAGNOSIS — G4700 Insomnia, unspecified: Secondary | ICD-10-CM | POA: Diagnosis not present

## 2021-03-06 NOTE — Progress Notes (Signed)
Careteam: Patient Care Team: [name], NP as PCP - General (Geriatric Medicine) [name], MD as PCP - Cardiology (Cardiology) [name], MD as Consulting Physician (Ophthalmology)  PLACE OF SERVICE:  [place] Directive information Does Patient Have a Medical Advance Directive?: Yes, Type of Advance Directive: Living will, Does patient want to make changes to medical advance directive?: No - Patient declined  Allergies  Allergen Reactions   Benadryl [Diphenhydramine]     Side effects per records received from [facility]    Lipitor [Atorvastatin]     rash   Tricor [Fenofibrate]     rash    Chief Complaint  Patient presents with   Medical Management of Chronic Issues    6 month follow-up. Discuss need for PCV or post pone if patient refuses. Foot exam today. Here with daughter [name].      HPI: [name] here for a 6 month follow.   Pt has no issues with indigestion.  Pt denies taking any NSAIDs. Only Tylenol for pain PRN.  Diet: Bowl of cereal in the morning, with a boil egg. English muffins. 6 ounce of yogurt for lunch. Boost in the afternoons, vegetables, and a meat.   Does have some trouble falling asleep but not often. Often falls asleep 2-2:30 am - 10 to 11 am   Is not physically active at home much but does attend a class once wkly for a modified exercise.  Up to date with eye exam, and vaccines. Reports she has gotten all vaccines at [facility]  Review of Systems:  Review of Systems  Constitutional:  Negative for diaphoresis, fever and weight loss.  Respiratory:  Negative for shortness of breath.   Cardiovascular:  Negative for chest pain, palpitations and leg swelling.  Gastrointestinal:  Negative for constipation, diarrhea and heartburn.  Genitourinary:  Negative for dysuria, frequency and urgency.  Musculoskeletal:  Negative for joint pain and myalgias.  Neurological:  Positive for tingling.  Negative for weakness.       Tingling in feet   Psychiatric/Behavioral:  Negative for depression. The patient is not nervous/anxious.    Past Medical History:  Diagnosis Date   Atherosclerosis of aorta [facility])    s/p AO bypass 2004, Per records received from [facility]    Bladder cancer [facility]) 02/10/2010   CAD (coronary artery disease)    Coronary intervention [facility] in 2000.   Diabetes mellitus without complication ([place])    Former tobacco use    Per records received from [place] (giant cell arteritis) ([place])    Giant cell arteritis ([place])    Per records received from [facility]ology    Hyperlipidemia    Hypertension    Macular degeneration    Osteoporosis    Prolia injection administered on 05/09/2019, Per records received from [facility]    PAD (peripheral artery disease) [facility])    s/p bilateral iliac-femoral artery bypass 2000, Per records received from [facility] Rheumatology    Peripheral vascular disease of lower extremity ([place])    aortobifemoral bypass grafting at [facility] in [place] in 2004.    Presence of stent in left circumflex coronary artery    Rheumatoid arthritis [facility])    Per records received from [facility]umat[facility]    Past Surgical History:  Procedure Laterality Date   AORTO-FEMORAL BYPASS GRAFT     aortobifemoral bypass grafting at [facility] in [place] in 2004.    CATARACT  EXTRACTION     HERNIA REPAIR     KIDNEY SURGERY  04/06/2011   URETER SURGERY  05/05/2011   Social History:   reports that she quit smoking about 9 years ago. Her smoking use included cigarettes. She has a 64.00 pack-year smoking history. She has never used smokeless tobacco. She reports current alcohol use. She reports that she does not use drugs.  Family History  Problem Relation Age of Onset   Stroke Mother    Hypertension Mother        Per records received from [facility] Rheumatology    Lung cancer Father    Heart  attack Brother    Hyperlipidemia Brother    Hypertension Brother    Diabetes Maternal Uncle    Diabetes Maternal Grandmother    Hypertension Son        Per records received from [facility]    Medications: Patient's Medications  New Prescriptions   No medications on file  Previous Medications   ASCORBIC ACID (VITAMIN C PO)    Take 1 tablet by mouth as directed. Seasonally late fall- winter   B COMPLEX VITAMINS TABLET    Take 1 tablet by mouth daily.   CALCIUM CARBONATE (CALCIUM 500 PO)    Take 1 tablet by mouth in the morning and at bedtime. Only 2 weeks prior to Prolia injection and 2 weeks after   CHOLECALCIFEROL (VITAMIN D3) 50 MCG (2000 UT) TABS    Take 1 tablet by mouth every other day.   DENOSUMAB (PROLIA) 60 MG/ML SOSY INJECTION    Inject 60 mg into the skin every 6 (six) months.   GLUCOSE BLOOD TEST STRIP    Use to test blood sugar once daily. Dx:E11.69   LOSARTAN (COZAAR) 100 MG TABLET    TAKE 1 TABLET BY MOUTH DAILY.   METFORMIN (GLUCOPHAGE) 500 MG TABLET    TAKE 2 TABLETS BY MOUTH DAILY IN THE MORNING AND 1 TABLET WITH EVENING MEAL   METOPROLOL TARTRATE (LOPRESSOR) 25 MG TABLET    TAKE 1 TABLET BY MOUTH IN THE MORNING AND 1/2 TABLET IN THE EVENING   MIRTAZAPINE (REMERON) 15 MG TABLET    Take 15 mg by mouth at bedtime.   MULTIPLE VITAMINS-MINERALS (ICAPS AREDS 2) CAPS    Take 2 capsules by mouth daily. Every morning and every evening   MULTIPLE VITAMINS-MINERALS (WOMENS MULTI GUMMIES PO)    Take 1 tablet by mouth in the morning and at bedtime.   OMEPRAZOLE (PRILOSEC) 20 MG CAPSULE    TAKE 1 CAPSULE (20 MG TOTAL) BY MOUTH DAILY.   ROSUVASTATIN (CRESTOR) 10 MG TABLET    TAKE 1 TABLET BY MOUTH DAILY.   TOCILIZUMAB (ACTEMRA IV)    Inject into the vein every 30 (thirty) days. Administered by rheumatologist for autoimmune disease   XARELTO 2.5 MG TABS TABLET    TAKE 1 TABLET BY MOUTH 2 TIMES DAILY  Modified Medications   No medications on file  Discontinued Medications    No medications on file    Physical Exam:  Vitals:   03/06/21 1045  BP: 126/68  Pulse: 86  Temp: (!) 97.3 F (36.3 C)  TempSrc: Temporal  Weight: 105 lb (47.6 kg)  Height: 5\' 1"  (1.549 m)   Body mass index is 19.84 kg/m. Wt Readings from Last 3 Encounters:  03/06/21 105 lb (47.6 kg)  09/05/20 104 lb (47.2 kg)  04/23/20 109 lb (49.4 kg)    Physical Exam Constitutional:      Appearance:  Normal appearance. She is normal weight.  HENT:     Right Ear: Tympanic membrane, ear canal and external ear normal.     Left Ear: Tympanic membrane and external ear normal.     Nose: Nose normal.     Mouth/Throat:     Mouth: Mucous membranes are moist.     Pharynx: Oropharynx is clear.  Eyes:     Conjunctiva/sclera: Conjunctivae normal.  Cardiovascular:     Rate and Rhythm: Normal rate.     Pulses:          Dorsalis pedis pulses are 1+ on the right side and 1+ on the left side.       Posterior tibial pulses are 1+ on the right side and 1+ on the left side.     Heart sounds: Normal heart sounds.  Pulmonary:     Effort: Pulmonary effort is normal.     Breath sounds: Normal breath sounds.  Abdominal:     General: Abdomen is flat. Bowel sounds are normal.     Palpations: Abdomen is soft.  Musculoskeletal:        General: No swelling or tenderness. Normal range of motion.     Cervical back: Normal range of motion.     Right lower leg: No edema.     Left lower leg: No edema.  Feet:     Right foot:     Protective Sensation: 5 sites tested.  5 sites sensed.     Skin integrity: Skin integrity normal. No ulcer, blister, skin breakdown, erythema, warmth, callus, dry skin or fissure.     Toenail Condition: Right toenails are normal.     Left foot:     Protective Sensation: 5 sites tested.  5 sites sensed.     Skin integrity: Skin integrity normal. No ulcer, blister, skin breakdown, erythema, warmth, callus, dry skin or fissure.     Toenail Condition: Left toenails are normal.  Skin:     General: Skin is warm and dry.     Capillary Refill: Capillary refill takes less than 2 seconds.  Neurological:     Mental Status: She is alert and oriented to person, place, and time.     Sensory: No sensory deficit.     Motor: No weakness.  Psychiatric:        Mood and Affect: Mood normal.        Behavior: Behavior normal.        Thought Content: Thought content normal.        Judgment: Judgment normal.    Labs reviewed: Basic Metabolic Panel: Recent Labs    09/02/20 0902 03/03/21 0916  NA 140 142  K 4.5 4.3  CL 105 106  CO2 26 30  GLUCOSE 96 121*  BUN 25 29*  CREATININE 1.02* 1.08*  CALCIUM 9.9 9.5   Liver Function Tests: Recent Labs    09/02/20 0902 03/03/21 0916  AST 14 16  ALT 16 15  BILITOT 0.6 0.6  PROT 6.4 6.6   No results for input(s): LIPASE, AMYLASE in the last 8760 hours. No results for input(s): AMMONIA in the last 8760 hours. CBC: Recent Labs    03/03/21 0916  WBC 5.7  NEUTROABS 3,050  HGB 13.7  HCT 40.3  MCV 92.9  PLT 152   Lipid Panel: Recent Labs    09/02/20 0902  CHOL 155  HDL 40*  LDLCALC 79  TRIG 273*  CHOLHDL 3.9   TSH: No results for input(s): TSH in the  last 8760 hours. A1C: Lab Results  Component Value Date   HGBA1C 6.3 (H) 03/03/2021     Assessment/Plan  1. Diabetes mellitus due to underlying condition with stage 3a chronic kidney disease, without long-term current use of insulin (HCC) -a1c at goal.  -Encouraged dietary compliance, routine foot care/monitoring and to keep up with diabetic eye exams through ophthalmology   2. Age-related osteoporosis without current pathological fracture -continues on prolia.  -Recommended to take calcium 600 mg twice daily with Vitamin D 2000 units daily and weight bearing activity 30 mins/5 days a week  3. Atherosclerotic heart disease of native coronary artery with other forms of angina pectoris (HCC) Stable, without chest pains, continues on xarelto and metoprolol.   4.  Current mild episode of major depressive disorder, unspecified whether recurrent (HCC) -stable on remeron.   5. GCA (giant cell arteritis) ([place]) Continues to follow up with rheumatology. Continues on tocilzumab infusions.   6. PAD (peripheral artery disease) (HCC) -stable without symptoms at this time.  7. Insomnia, unspecified type Overall controlled occasionally will have a night that she has a hard time falling asleep, continues remeron.      Next appt: Return in about 6 months (around 09/03/2021) for routine follow up, labs prior to appt .  I personally was present during the history, physical exam and medical decision-making activities of this service and have verified that the service and findings are accurately documented in the students note  Janett Billow K. [place], [facility] [phone]

## 2021-03-20 DIAGNOSIS — M0609 Rheumatoid arthritis without rheumatoid factor, multiple sites: Secondary | ICD-10-CM | POA: Diagnosis not present

## 2021-03-20 DIAGNOSIS — Z79899 Other long term (current) drug therapy: Secondary | ICD-10-CM | POA: Diagnosis not present

## 2021-03-20 DIAGNOSIS — R5382 Chronic fatigue, unspecified: Secondary | ICD-10-CM | POA: Diagnosis not present

## 2021-03-20 DIAGNOSIS — D508 Other iron deficiency anemias: Secondary | ICD-10-CM | POA: Diagnosis not present

## 2021-03-20 DIAGNOSIS — M316 Other giant cell arteritis: Secondary | ICD-10-CM | POA: Diagnosis not present

## 2021-03-20 DIAGNOSIS — Z681 Body mass index (BMI) 19 or less, adult: Secondary | ICD-10-CM | POA: Diagnosis not present

## 2021-03-20 DIAGNOSIS — M810 Age-related osteoporosis without current pathological fracture: Secondary | ICD-10-CM | POA: Diagnosis not present

## 2021-03-25 DIAGNOSIS — M0609 Rheumatoid arthritis without rheumatoid factor, multiple sites: Secondary | ICD-10-CM | POA: Diagnosis not present

## 2021-03-25 DIAGNOSIS — M316 Other giant cell arteritis: Secondary | ICD-10-CM | POA: Diagnosis not present

## 2021-03-25 DIAGNOSIS — Z79899 Other long term (current) drug therapy: Secondary | ICD-10-CM | POA: Diagnosis not present

## 2021-03-25 LAB — CBC AND DIFFERENTIAL
HCT: 39 (ref 36–46)
Hemoglobin: 13.4 (ref 12.0–16.0)
Neutrophils Absolute: 3.5
Platelets: 151 (ref 150–399)
WBC: 5.8

## 2021-03-25 LAB — BASIC METABOLIC PANEL
BUN: 31 — AB (ref 4–21)
CO2: 17 (ref 13–22)
Chloride: 105 (ref 99–108)
Creatinine: 1 (ref 0.5–1.1)
Glucose: 234
Potassium: 5.3 (ref 3.4–5.3)
Sodium: 14 — AB (ref 137–147)

## 2021-03-25 LAB — HEPATIC FUNCTION PANEL
ALT: 15 (ref 7–35)
AST: 24 (ref 13–35)
Bilirubin, Total: 0.3

## 2021-03-25 LAB — CBC: RBC: 4.18 (ref 3.87–5.11)

## 2021-03-25 LAB — COMPREHENSIVE METABOLIC PANEL: Calcium: 9.5 (ref 8.7–10.7)

## 2021-04-02 ENCOUNTER — Other Ambulatory Visit: Payer: Self-pay | Admitting: Nurse Practitioner

## 2021-04-02 DIAGNOSIS — E1169 Type 2 diabetes mellitus with other specified complication: Secondary | ICD-10-CM

## 2021-04-20 ENCOUNTER — Ambulatory Visit (HOSPITAL_BASED_OUTPATIENT_CLINIC_OR_DEPARTMENT_OTHER)
Admission: RE | Admit: 2021-04-20 | Discharge: 2021-04-20 | Disposition: A | Discharge status: Home / Self Care | Payer: Medicare Other | Source: Ambulatory Visit | Attending: Cardiovascular Disease | Admitting: Cardiovascular Disease

## 2021-04-20 ENCOUNTER — Ambulatory Visit (HOSPITAL_COMMUNITY)
Admission: RE | Admit: 2021-04-20 | Discharge: 2021-04-20 | Disposition: A | Discharge status: Home / Self Care | Payer: Medicare Other | Source: Ambulatory Visit | Attending: Cardiovascular Disease | Admitting: Cardiovascular Disease

## 2021-04-20 ENCOUNTER — Other Ambulatory Visit: Payer: Self-pay

## 2021-04-20 DIAGNOSIS — Z95828 Presence of other vascular implants and grafts: Secondary | ICD-10-CM | POA: Diagnosis not present

## 2021-04-27 ENCOUNTER — Other Ambulatory Visit: Payer: Self-pay | Admitting: Nurse Practitioner

## 2021-04-28 DIAGNOSIS — M316 Other giant cell arteritis: Secondary | ICD-10-CM | POA: Diagnosis not present

## 2021-04-28 DIAGNOSIS — M0609 Rheumatoid arthritis without rheumatoid factor, multiple sites: Secondary | ICD-10-CM | POA: Diagnosis not present

## 2021-05-22 ENCOUNTER — Ambulatory Visit: Payer: Medicare Other

## 2021-05-22 ENCOUNTER — Other Ambulatory Visit (HOSPITAL_COMMUNITY): Payer: Self-pay | Admitting: Cardiovascular Disease

## 2021-05-22 DIAGNOSIS — I739 Peripheral vascular disease, unspecified: Secondary | ICD-10-CM

## 2021-05-22 DIAGNOSIS — Z95828 Presence of other vascular implants and grafts: Secondary | ICD-10-CM

## 2021-06-01 ENCOUNTER — Ambulatory Visit: Payer: Medicare Other

## 2021-06-01 DIAGNOSIS — M316 Other giant cell arteritis: Secondary | ICD-10-CM | POA: Diagnosis not present

## 2021-06-02 ENCOUNTER — Ambulatory Visit: Payer: Medicare Other

## 2021-06-04 ENCOUNTER — Ambulatory Visit (INDEPENDENT_AMBULATORY_CARE_PROVIDER_SITE_OTHER): Payer: Medicare Other | Admitting: *Deleted

## 2021-06-04 DIAGNOSIS — H43813 Vitreous degeneration, bilateral: Secondary | ICD-10-CM | POA: Diagnosis not present

## 2021-06-04 DIAGNOSIS — M810 Age-related osteoporosis without current pathological fracture: Secondary | ICD-10-CM

## 2021-06-04 DIAGNOSIS — H35372 Puckering of macula, left eye: Secondary | ICD-10-CM | POA: Diagnosis not present

## 2021-06-04 DIAGNOSIS — H353132 Nonexudative age-related macular degeneration, bilateral, intermediate dry stage: Secondary | ICD-10-CM | POA: Diagnosis not present

## 2021-06-04 DIAGNOSIS — M316 Other giant cell arteritis: Secondary | ICD-10-CM | POA: Diagnosis not present

## 2021-06-04 DIAGNOSIS — H43392 Other vitreous opacities, left eye: Secondary | ICD-10-CM | POA: Diagnosis not present

## 2021-06-04 MED ORDER — DENOSUMAB 60 MG/ML ~~LOC~~ SOSY
60.0000 mg | PREFILLED_SYRINGE | Freq: Once | SUBCUTANEOUS | Status: AC
  Administered 2021-06-04: 60 mg via SUBCUTANEOUS

## 2021-06-18 ENCOUNTER — Other Ambulatory Visit: Payer: Self-pay | Admitting: Nurse Practitioner

## 2021-06-18 DIAGNOSIS — E538 Deficiency of other specified B group vitamins: Secondary | ICD-10-CM

## 2021-06-18 DIAGNOSIS — E781 Pure hyperglyceridemia: Secondary | ICD-10-CM

## 2021-06-18 DIAGNOSIS — E0865 Diabetes mellitus due to underlying condition with hyperglycemia: Secondary | ICD-10-CM

## 2021-06-30 DIAGNOSIS — M316 Other giant cell arteritis: Secondary | ICD-10-CM | POA: Diagnosis not present

## 2021-06-30 DIAGNOSIS — R5382 Chronic fatigue, unspecified: Secondary | ICD-10-CM | POA: Diagnosis not present

## 2021-06-30 DIAGNOSIS — Z79899 Other long term (current) drug therapy: Secondary | ICD-10-CM | POA: Diagnosis not present

## 2021-06-30 DIAGNOSIS — M0609 Rheumatoid arthritis without rheumatoid factor, multiple sites: Secondary | ICD-10-CM | POA: Diagnosis not present

## 2021-06-30 DIAGNOSIS — R5383 Other fatigue: Secondary | ICD-10-CM | POA: Diagnosis not present

## 2021-06-30 DIAGNOSIS — Z111 Encounter for screening for respiratory tuberculosis: Secondary | ICD-10-CM | POA: Diagnosis not present

## 2021-07-09 ENCOUNTER — Other Ambulatory Visit: Payer: Self-pay | Admitting: Cardiovascular Disease

## 2021-07-09 ENCOUNTER — Other Ambulatory Visit: Payer: Self-pay | Admitting: Nurse Practitioner

## 2021-07-09 DIAGNOSIS — K219 Gastro-esophageal reflux disease without esophagitis: Secondary | ICD-10-CM

## 2021-07-28 DIAGNOSIS — H353132 Nonexudative age-related macular degeneration, bilateral, intermediate dry stage: Secondary | ICD-10-CM | POA: Diagnosis not present

## 2021-07-29 DIAGNOSIS — M316 Other giant cell arteritis: Secondary | ICD-10-CM | POA: Diagnosis not present

## 2021-08-27 DIAGNOSIS — M0609 Rheumatoid arthritis without rheumatoid factor, multiple sites: Secondary | ICD-10-CM | POA: Diagnosis not present

## 2021-08-27 DIAGNOSIS — M316 Other giant cell arteritis: Secondary | ICD-10-CM | POA: Diagnosis not present

## 2021-09-01 ENCOUNTER — Other Ambulatory Visit: Payer: Self-pay | Admitting: Cardiovascular Disease

## 2021-09-01 DIAGNOSIS — I739 Peripheral vascular disease, unspecified: Secondary | ICD-10-CM

## 2021-09-02 NOTE — Telephone Encounter (Signed)
Pt has scheduled appt with Fabian Sharp, PA on 10/08/21. Appropriate dose and refill sent to requested pharmacy.

## 2021-09-02 NOTE — Telephone Encounter (Signed)
Prescription refill request for Xarelto received.   Indication: pad Last office visit: Anne Pena Found 04/29/2020 Weight: 47.6 kg  Age: 87 yo  Scr: 1.0, /15/2023 CrCl: 31 ml/min   Pt is overdue to see cardiologist. Msg sent to schedulers to make an appointment.

## 2021-09-08 ENCOUNTER — Other Ambulatory Visit: Payer: Medicare Other

## 2021-09-08 DIAGNOSIS — E781 Pure hyperglyceridemia: Secondary | ICD-10-CM

## 2021-09-08 DIAGNOSIS — E0865 Diabetes mellitus due to underlying condition with hyperglycemia: Secondary | ICD-10-CM | POA: Diagnosis not present

## 2021-09-08 DIAGNOSIS — E538 Deficiency of other specified B group vitamins: Secondary | ICD-10-CM | POA: Diagnosis not present

## 2021-09-09 LAB — COMPLETE METABOLIC PANEL WITH GFR
AG Ratio: 2.4 (calc) (ref 1.0–2.5)
ALT: 14 U/L (ref 6–29)
AST: 12 U/L (ref 10–35)
Albumin: 4.5 g/dL (ref 3.6–5.1)
Alkaline phosphatase (APISO): 37 U/L (ref 37–153)
BUN/Creatinine Ratio: 32 (calc) — ABNORMAL HIGH (ref 6–22)
BUN: 33 mg/dL — ABNORMAL HIGH (ref 7–25)
CO2: 26 mmol/L (ref 20–32)
Calcium: 9.9 mg/dL (ref 8.6–10.4)
Chloride: 107 mmol/L (ref 98–110)
Creat: 1.03 mg/dL — ABNORMAL HIGH (ref 0.60–0.95)
Globulin: 1.9 g/dL (calc) (ref 1.9–3.7)
Glucose, Bld: 109 mg/dL — ABNORMAL HIGH (ref 65–99)
Potassium: 4.8 mmol/L (ref 3.5–5.3)
Sodium: 142 mmol/L (ref 135–146)
Total Bilirubin: 0.4 mg/dL (ref 0.2–1.2)
Total Protein: 6.4 g/dL (ref 6.1–8.1)
eGFR: 54 mL/min/{1.73_m2} — ABNORMAL LOW (ref >=60)

## 2021-09-09 LAB — CBC WITH DIFFERENTIAL/PLATELET
Absolute Monocytes: 733 cells/uL (ref 200–950)
Basophils Absolute: 31 cells/uL (ref 0–200)
Basophils Relative: 0.6 %
Eosinophils Absolute: 192 cells/uL (ref 15–500)
Eosinophils Relative: 3.7 %
HCT: 39.6 % (ref 35.0–45.0)
Hemoglobin: 13.4 g/dL (ref 11.7–15.5)
Lymphs Abs: 1654 cells/uL (ref 850–3900)
MCH: 31.7 pg (ref 27.0–33.0)
MCHC: 33.8 g/dL (ref 32.0–36.0)
MCV: 93.6 fL (ref 80.0–100.0)
MPV: 10.8 fL (ref 7.5–12.5)
Monocytes Relative: 14.1 %
Neutro Abs: 2590 cells/uL (ref 1500–7800)
Neutrophils Relative %: 49.8 %
Platelets: 176 10*3/uL (ref 140–400)
RBC: 4.23 10*6/uL (ref 3.80–5.10)
RDW: 12.1 % (ref 11.0–15.0)
Total Lymphocyte: 31.8 %
WBC: 5.2 10*3/uL (ref 3.8–10.8)

## 2021-09-09 LAB — LIPID PANEL
Cholesterol: 162 mg/dL (ref <200)
HDL: 42 mg/dL — ABNORMAL LOW (ref >=50)
LDL Cholesterol (Calc): 83 mg/dL (calc)
Non-HDL Cholesterol (Calc): 120 mg/dL (calc) (ref <130)
Total CHOL/HDL Ratio: 3.9 (calc) (ref <5.0)
Triglycerides: 278 mg/dL — ABNORMAL HIGH (ref <150)

## 2021-09-09 LAB — VITAMIN B12: Vitamin B-12: >2000 pg/mL — ABNORMAL HIGH (ref 200–1100)

## 2021-09-09 LAB — HEMOGLOBIN A1C
Hgb A1c MFr Bld: 6.2 % of total Hgb — ABNORMAL HIGH (ref <5.7)
Mean Plasma Glucose: 131 mg/dL
eAG (mmol/L): 7.3 mmol/L

## 2021-09-10 NOTE — Progress Notes (Addendum)
Careteam: Patient Care Team: [name], NP as PCP - General (Geriatric Medicine) [name], MD as PCP - Cardiology (Cardiology) [name], MD as Consulting Physician (Ophthalmology)  PLACE OF SERVICE:  [place] Directive information Does Patient Have a Medical Advance Directive?: Yes, Type of Advance Directive: Living will, Does patient want to make changes to medical advance directive?: No - Patient declined  Allergies  Allergen Reactions   Benadryl [Diphenhydramine]     Side effects per records received from [facility] Rheumatology    Lipitor [Atorvastatin]     rash   Tricor [Fenofibrate]     rash    Chief Complaint  Patient presents with   Medical Management of Chronic Issues    6 month follow-up and discuss labs (copy printed and given to patient). Discuss need for PCV, eye exam and flu vaccine or post pone if patient refuses. NCIR verified, patient denies receiving any vaccines since last visit.      HPI: [name] who presents for routine follow up. Denies any acute problems. Endorses a good appetite and regular bowel regime. Walks in her home for exercise and will sometimes lift small dumbbells. Endorses adequate sleep and states in addition to Remeron she also takes 10 mg of melatonin.   Review of Systems:  Review of Systems  Constitutional:  Negative for fever, malaise/fatigue and weight loss.  HENT:  Negative for ear pain and hearing loss.   Eyes:  Negative for blurred vision and double vision.  Respiratory:  Negative for cough and shortness of breath.   Cardiovascular:  Negative for chest pain, palpitations and leg swelling.  Gastrointestinal:  Negative for abdominal pain, blood in stool, constipation, diarrhea and heartburn.  Genitourinary:  Negative for dysuria and hematuria.  Musculoskeletal:  Negative for myalgias.  Skin:  Negative for itching and rash.  Neurological:  Negative for dizziness and  weakness.  Endo/Heme/Allergies:  Negative for polydipsia.  Psychiatric/Behavioral:  Negative for depression. The patient is not nervous/anxious.    Past Medical History:  Diagnosis Date   Atherosclerosis of aorta [facility])    s/p AO bypass 2004, Per records received from [facility] Rhe[facility]ology    Bladder cancer [facility]) 02/10/2010   CAD (coronary artery disease)    Coronary intervention [facility] in 2000.   Diabetes mellitus without complication ([place])    Former tobacco use    Per records received from [facility] (giant cell arteritis) ([place])    Giant cell arteritis ([place])    Per records received from [facility] Rheumatology    Hyperlipidemia    Hypertension    Macular degeneration    Osteoporosis    Prolia injection administered on 05/09/2019, Per records received from [facility]umatology    PAD (peripheral artery disease) [facility])    s/p bilateral iliac-femoral artery bypass 2000, Per records received from [facility] Rheumatology    Peripheral vascular disease of lower extremity ([place])    aortobifemoral bypass grafting at [facility] in [place] in 2004.    Presence of stent in left circumflex coronary artery    Rheumatoid arthritis [facility])    Per records received from [facility] Rheumatology    Past Surgical History:  Procedure Laterality Date   AORTO-FEMORAL BYPASS GRAFT     aortobifemoral bypass grafting at [facility] in [place] in 2004.    CATARACT EXTRACTION     HERNIA REPAIR     KIDNEY SURGERY  04/06/2011   URETER SURGERY  05/05/2011   Social History:   reports that she quit smoking about 10 years ago. Her smoking use included cigarettes. She has a 64.00 pack-year smoking history. She has never used smokeless tobacco. She reports current alcohol use. She reports that she does not use drugs.  Family History  Problem Relation Age of Onset   Stroke Mother    Hypertension Mother        Per records received from [facility] Rheumatology    Lung cancer Father     Heart attack Brother    Hyperlipidemia Brother    Hypertension Brother    Diabetes Maternal Uncle    Diabetes Maternal Grandmother    Hypertension Son        Per records received from [place] [facility]    Medications: Patient's Medications  New Prescriptions   No medications on file  Previous Medications   ASCORBIC ACID (VITAMIN C PO)    Take 1 tablet by mouth as directed. Seasonally late fall- winter   B COMPLEX VITAMINS TABLET    Take 1 tablet by mouth daily.   CALCIUM CARBONATE (CALCIUM 500 PO)    Take 1 tablet by mouth in the morning and at bedtime. Only 2 weeks prior to Prolia injection and 2 weeks after   CHOLECALCIFEROL (VITAMIN D3) 50 MCG (2000 UT) TABS    Take 1 tablet by mouth every other day.   DENOSUMAB (PROLIA) 60 MG/ML SOSY INJECTION    Inject 60 mg into the skin every 6 (six) months.   GLUCOSE BLOOD TEST STRIP    Use to test blood sugar once daily. Dx:E11.69   LOSARTAN (COZAAR) 100 MG TABLET    TAKE 1 TABLET BY MOUTH DAILY.   METFORMIN (GLUCOPHAGE) 500 MG TABLET    TAKE 2 TABLETS BY MOUTH DAILY IN THE MORNING AND 1 TABLET WITH EVENING MEAL   METOPROLOL TARTRATE (LOPRESSOR) 25 MG TABLET    TAKE 1 TABLET BY MOUTH IN THE MORNING AND 1/2 TABLET IN THE EVENING   MIRTAZAPINE (REMERON) 15 MG TABLET    TAKE 2 TABLETS (30 MG TOTAL) BY MOUTH AT BEDTIME.   MULTIPLE VITAMINS-MINERALS (ICAPS AREDS 2) CAPS    Take 2 capsules by mouth daily. Every morning and every evening   MULTIPLE VITAMINS-MINERALS (WOMENS MULTI GUMMIES PO)    Take 1 tablet by mouth in the morning and at bedtime.   OMEPRAZOLE (PRILOSEC) 20 MG CAPSULE    TAKE 1 CAPSULE BY MOUTH DAILY.   RIVAROXABAN (XARELTO) 2.5 MG TABS TABLET    TAKE 1 TABLET BY MOUTH 2 TIMES DAILY   ROSUVASTATIN (CRESTOR) 10 MG TABLET    TAKE 1 TABLET BY MOUTH DAILY.   TOCILIZUMAB (ACTEMRA IV)    Inject into the vein every 30 (thirty) days. Administered by rheumatologist for autoimmune disease  Modified Medications   No medications on file   Discontinued Medications   No medications on file    Physical Exam:  Vitals:   09/11/21 0944  BP: 132/70  Pulse: 80  Temp: (!) 97.1 F (36.2 C)  SpO2: 97%  Weight: 106 lb 6.4 oz (48.3 kg)  Height: '5\' 1"'$  (1.549 m)   Body mass index is 20.1 kg/m. Wt Readings from Last 3 Encounters:  09/11/21 106 lb 6.4 oz (48.3 kg)  03/06/21 105 lb (47.6 kg)  09/05/20 104 lb (47.2 kg)    Physical Exam Constitutional:      General: She is not in acute distress.    Appearance: Normal appearance.  HENT:  Right Ear: Tympanic membrane, ear canal and external ear normal.     Left Ear: Tympanic membrane, ear canal and external ear normal.     Nose: No congestion.     Mouth/Throat:     Mouth: Mucous membranes are moist.     Pharynx: Oropharynx is clear.  Eyes:     Conjunctiva/sclera: Conjunctivae normal.     Pupils: Pupils are equal, round, and reactive to light.  Cardiovascular:     Rate and Rhythm: Normal rate and regular rhythm.     Heart sounds: Normal heart sounds. No murmur heard. Pulmonary:     Effort: Pulmonary effort is normal.     Breath sounds: Normal breath sounds. No wheezing.  Abdominal:     General: Bowel sounds are normal. There is no distension.     Palpations: Abdomen is soft.     Tenderness: There is no abdominal tenderness.  Musculoskeletal:        General: No swelling or tenderness. Normal range of motion.  Neurological:     Mental Status: She is alert and oriented to person, place, and time.  Psychiatric:        Mood and Affect: Mood normal.        Behavior: Behavior normal.     Labs reviewed: Basic Metabolic Panel: Recent Labs    03/03/21 0916 03/25/21 0000 09/08/21 0853  NA 142 14* 142  K 4.3 5.3 4.8  CL 106 105 107  CO2 '30 17 26  '$ GLUCOSE 121*  --  109*  BUN 29* 31* 33*  CREATININE 1.08* 1.0 1.03*  CALCIUM 9.5 9.5 9.9   Liver Function Tests: Recent Labs    03/03/21 0916 03/25/21 0000 09/08/21 0853  AST '16 24 12  '$ ALT '15 15 14  '$ BILITOT  0.6  --  0.4  PROT 6.6  --  6.4   No results for input(s): "LIPASE", "AMYLASE" in the last 8760 hours. No results for input(s): "AMMONIA" in the last 8760 hours. CBC: Recent Labs    03/03/21 0916 03/25/21 0000 09/08/21 0853  WBC 5.7 5.8 5.2  NEUTROABS 3,050 3.50 2,590  HGB 13.7 13.4 13.4  HCT 40.3 39 39.6  MCV 92.9  --  93.6  PLT 152 151 176   Lipid Panel: Recent Labs    09/08/21 0853  CHOL 162  HDL 42*  LDLCALC 83  TRIG 278*  CHOLHDL 3.9   TSH: No results for input(s): "TSH" in the last 8760 hours. A1C: Lab Results  Component Value Date   HGBA1C 6.2 (H) 09/08/2021     Assessment/Plan 1. Hypertriglyceridemia - chronic  and stable - Reinforced healthy eating habits  2. Vitamin B12 deficiency Stable - elevated at last check. >2000 (09/2021) - informed that patient can take b complex supplement qod instead of daily  3. Age-related osteoporosis without current pathological fracture -Continue Prolia -Encourage weight bearing exercise as tolerated   4. Diabetes mellitus due to underlying condition with stage 3a chronic kidney disease, without long-term current use of insulin (HCC) - A1c at goal  - Continue metformin - GFR 56 BUN 33 Cr 1.03 avoid nephrotoxins, renally dose medications, encourage hydration. -Encouraged dietary compliance, routine foot care/monitoring and to keep up with diabetic eye exams through ophthalmology   5. GCA (giant cell arteritis) ([place]) - Follows with rheumatology - Continue Tocilizumab infusions  6. Current mild episode of major depressive disorder, unspecified whether recurrent ([place]) - Stable and well controlled.  - Continue remeron  7. Insomnia, unspecified type -  endorses sleeping well - continue Remeron and melatonin  Return in about 6 months (around 03/14/2022) for routine follow up, labs prior . I personally was present during the history, physical exam and medical decision-making activities of this service and have  verified that the service and findings are accurately documented in the student's note Jessica K. [place], [facility] [phone]

## 2021-09-11 ENCOUNTER — Encounter: Payer: Self-pay | Admitting: Nurse Practitioner

## 2021-09-11 ENCOUNTER — Ambulatory Visit (INDEPENDENT_AMBULATORY_CARE_PROVIDER_SITE_OTHER): Payer: Medicare Other | Admitting: Nurse Practitioner

## 2021-09-11 VITALS — BP 132/70 | HR 80 | Temp 97.1°F | Ht 61.0 in | Wt 106.4 lb

## 2021-09-11 DIAGNOSIS — Z95828 Presence of other vascular implants and grafts: Secondary | ICD-10-CM | POA: Diagnosis not present

## 2021-09-11 DIAGNOSIS — E538 Deficiency of other specified B group vitamins: Secondary | ICD-10-CM

## 2021-09-11 DIAGNOSIS — I25118 Atherosclerotic heart disease of native coronary artery with other forms of angina pectoris: Secondary | ICD-10-CM

## 2021-09-11 DIAGNOSIS — G4700 Insomnia, unspecified: Secondary | ICD-10-CM

## 2021-09-11 DIAGNOSIS — F320 Major depressive disorder, single episode, mild: Secondary | ICD-10-CM | POA: Diagnosis not present

## 2021-09-11 DIAGNOSIS — M316 Other giant cell arteritis: Secondary | ICD-10-CM

## 2021-09-11 DIAGNOSIS — E0822 Diabetes mellitus due to underlying condition with diabetic chronic kidney disease: Secondary | ICD-10-CM | POA: Diagnosis not present

## 2021-09-11 DIAGNOSIS — M810 Age-related osteoporosis without current pathological fracture: Secondary | ICD-10-CM | POA: Diagnosis not present

## 2021-09-11 DIAGNOSIS — N1831 Chronic kidney disease, stage 3a: Secondary | ICD-10-CM

## 2021-09-11 DIAGNOSIS — E781 Pure hyperglyceridemia: Secondary | ICD-10-CM

## 2021-09-11 NOTE — Patient Instructions (Signed)
Please sign a record release for [facility] on check out.

## 2021-09-21 DIAGNOSIS — R5382 Chronic fatigue, unspecified: Secondary | ICD-10-CM | POA: Diagnosis not present

## 2021-09-21 DIAGNOSIS — M810 Age-related osteoporosis without current pathological fracture: Secondary | ICD-10-CM | POA: Diagnosis not present

## 2021-09-21 DIAGNOSIS — D508 Other iron deficiency anemias: Secondary | ICD-10-CM | POA: Diagnosis not present

## 2021-09-21 DIAGNOSIS — Z681 Body mass index (BMI) 19 or less, adult: Secondary | ICD-10-CM | POA: Diagnosis not present

## 2021-09-21 DIAGNOSIS — Z79899 Other long term (current) drug therapy: Secondary | ICD-10-CM | POA: Diagnosis not present

## 2021-09-21 DIAGNOSIS — M316 Other giant cell arteritis: Secondary | ICD-10-CM | POA: Diagnosis not present

## 2021-09-21 DIAGNOSIS — M0609 Rheumatoid arthritis without rheumatoid factor, multiple sites: Secondary | ICD-10-CM | POA: Diagnosis not present

## 2021-09-24 DIAGNOSIS — M316 Other giant cell arteritis: Secondary | ICD-10-CM | POA: Diagnosis not present

## 2021-09-29 ENCOUNTER — Other Ambulatory Visit: Payer: Self-pay | Admitting: Nurse Practitioner

## 2021-09-29 DIAGNOSIS — E1169 Type 2 diabetes mellitus with other specified complication: Secondary | ICD-10-CM

## 2021-10-01 DIAGNOSIS — H35372 Puckering of macula, left eye: Secondary | ICD-10-CM | POA: Diagnosis not present

## 2021-10-01 DIAGNOSIS — H47012 Ischemic optic neuropathy, left eye: Secondary | ICD-10-CM | POA: Diagnosis not present

## 2021-10-01 DIAGNOSIS — H353132 Nonexudative age-related macular degeneration, bilateral, intermediate dry stage: Secondary | ICD-10-CM | POA: Diagnosis not present

## 2021-10-01 DIAGNOSIS — M316 Other giant cell arteritis: Secondary | ICD-10-CM | POA: Diagnosis not present

## 2021-10-07 NOTE — Progress Notes (Unsigned)
Cardiology Office Note:    Date:  10/08/2021   ID:  Anne Pena, DOB July 11, 1934, MRN [id number]  PCP:  [name], NP   [place] Providers Cardiologist:  Quay Burow, MD Cardiology APP:  Ledora Bottcher, PA { Referring MD: [name], NP   Chief Complaint  Patient presents with   Follow-up    PAD    History of Present Illness:    Anne Pena is a 87 y.o. female with a hx of CAD, PAD, history of 50-pack-year tobacco abuse, hypertension, DM, giant cell arteritis, and hyperlipidemia.  She had PCI at [facility] in 2000 with ?stent to LCX.  She had aortobifem bypass grafting in [place] in 2004.  She has never had a heart attack or stroke.  Dr. Gwenlyn Found has followed her PAD and last ultrasound in March 2023 showed stable disease.  She was last seen by Dr. Gwenlyn Found 04/2020 and was doing well at that time. She presents today for routine follow up.  She is here with her daughter who helps with history.  She complains of bilateral hip pain that sounds more consistent with OA.  She does not want a referral to Ortho at this time.  Overall she is doing quite well and has no cardiac complaints.  She has not had any recent falls.   Past Medical History:  Diagnosis Date   Atherosclerosis of aorta [facility])    s/p AO bypass 2004, Per records received from [facility] Rheumatology    Bladder cancer [facility]) 02/10/2010   CAD (coronary artery disease)    Coronary intervention [facility] in 2000.   Diabetes mellitus without complication ([place])    Former tobacco use    Per records received from [place] (giant cell arteritis) ([place])    Giant cell arteritis ([place])    Per records received from [facility] Rheumatology    Hyperlipidemia    Hypertension    Macular degeneration    Osteoporosis    Prolia injection administered on 05/09/2019, Per records received from [facility]umatology    PAD (peripheral artery disease) [facility])    s/p bilateral iliac-femoral  artery bypass 2000, Per records received from [facility] Rheumatology    Peripheral vascular disease of lower extremity ([place])    aortobifemoral bypass grafting at [facility] in [place] in 2004.    Presence of stent in left circumflex coronary artery    Rheumatoid arthritis [facility])    Per records received from [facility] Rheumatology     Past Surgical History:  Procedure Laterality Date   AORTO-FEMORAL BYPASS GRAFT     aortobifemoral bypass grafting at [facility] in [place] in 2004.    CATARACT EXTRACTION     HERNIA REPAIR     KIDNEY SURGERY  04/06/2011   URETER SURGERY  05/05/2011    Current Medications: Current Meds  Medication Sig   Ascorbic Acid (VITAMIN C PO) Take 1 tablet by mouth as directed. Seasonally late fall- winter   b complex vitamins tablet Take 1 tablet by mouth daily.   Calcium Carbonate (CALCIUM 500 PO) Take 1 tablet by mouth in the morning and at bedtime. Only 2 weeks prior to Prolia injection and 2 weeks after   Cholecalciferol (VITAMIN D3) 50 MCG (2000 UT) TABS Take 1 tablet by mouth every other day.   denosumab (PROLIA) 60 MG/ML SOSY injection Inject 60 mg into the skin every 6 (six) months.   glucose blood test strip Use to test blood sugar once daily. Dx:E11.69  icosapent Ethyl (VASCEPA) 1 g capsule Take 2 capsules (2 g total) by mouth 2 (two) times daily.   metFORMIN (GLUCOPHAGE) 500 MG tablet TAKE 2 TABLETS BY MOUTH IN THE MORNING AND 1 TABLET WITH EVENING MEAL   mirtazapine (REMERON) 15 MG tablet TAKE 2 TABLETS (30 MG TOTAL) BY MOUTH AT BEDTIME.   Multiple Vitamins-Minerals (ICAPS AREDS 2) CAPS Take 2 capsules by mouth daily. Every morning and every evening   Multiple Vitamins-Minerals (WOMENS MULTI GUMMIES PO) Take 1 tablet by mouth in the morning and at bedtime.   omeprazole (PRILOSEC) 20 MG capsule TAKE 1 CAPSULE BY MOUTH DAILY.   Tocilizumab (ACTEMRA IV) Inject into the vein every 30 (thirty) days. Administered by rheumatologist for autoimmune disease    [DISCONTINUED] losartan (COZAAR) 100 MG tablet TAKE 1 TABLET BY MOUTH DAILY.   [DISCONTINUED] metoprolol tartrate (LOPRESSOR) 25 MG tablet TAKE 1 TABLET BY MOUTH IN THE MORNING AND 1/2 TABLET IN THE EVENING   [DISCONTINUED] rivaroxaban (XARELTO) 2.5 MG TABS tablet TAKE 1 TABLET BY MOUTH 2 TIMES DAILY   [DISCONTINUED] rosuvastatin (CRESTOR) 10 MG tablet TAKE 1 TABLET BY MOUTH DAILY.     Allergies:   Benadryl [diphenhydramine], Lipitor [atorvastatin], and Tricor [fenofibrate]   Social History   Socioeconomic History   Marital status: Widowed    Spouse name: Not on file   Number of children: Not on file   Years of education: Not on file   Highest education level: Not on file  Occupational History   Not on file  Tobacco Use   Smoking status: Former    Packs/day: 1.00    Years: 64.00    Total pack years: 64.00    Types: Cigarettes    Quit date: 04/06/2011    Years since quitting: 10.5   Smokeless tobacco: Never  Vaping Use   Vaping Use: Never used  Substance and Sexual Activity   Alcohol use: Yes    Comment: Rarely    Drug use: Never   Sexual activity: Not Currently  Other Topics Concern   Not on file  Social History Narrative   Not on file   Social Determinants of Health   Financial Resource Strain: Not on file  Food Insecurity: Not on file  Transportation Needs: Not on file  Physical Activity: Not on file  Stress: Not on file  Social Connections: Not on file     Family History: The patient's family history includes Diabetes in her maternal grandmother and maternal uncle; Heart attack in her brother; Hyperlipidemia in her brother; Hypertension in her brother, mother, and son; Lung cancer in her father; Stroke in her mother.  ROS:   Please see the history of present illness.     All other systems reviewed and are negative.  EKGs/Labs/Other Studies Reviewed:    The following studies were reviewed today:  Dopplers 04/2021 with stable disease, per Dr.  Gwenlyn Found  EKG:  EKG is  ordered today.  The ekg ordered today demonstrates sinus rhythm with HR 81, anteroseptal infarct stable from prior tracings  Recent Labs: 09/08/2021: ALT 14; BUN 33; Creat 1.03; Hemoglobin 13.4; Platelets 176; Potassium 4.8; Sodium 142  Recent Lipid Panel    Component Value Date/Time   CHOL 162 09/08/2021 0853   CHOL 158 05/08/2018 1005   TRIG 278 (H) 09/08/2021 0853   HDL 42 (L) 09/08/2021 0853   HDL 37 (L) 05/08/2018 1005   CHOLHDL 3.9 09/08/2021 0853   LDLCALC 83 09/08/2021 0853     Risk Assessment/Calculations:  Physical Exam:    VS:  BP 126/60   Pulse 84   Ht '5\' 1"'$  (1.549 m)   Wt 105 lb 6.4 oz (47.8 kg)   SpO2 97%   BMI 19.92 kg/m     Wt Readings from Last 3 Encounters:  10/08/21 105 lb 6.4 oz (47.8 kg)  09/11/21 106 lb 6.4 oz (48.3 kg)  03/06/21 105 lb (47.6 kg)     GEN:  Well nourished, well developed in no acute distress HEENT: Normal NECK: No JVD; No carotid bruits LYMPHATICS: No lymphadenopathy CARDIAC: RRR, no murmurs, rubs, gallops RESPIRATORY:  Clear to auscultation without rales, wheezing or rhonchi  ABDOMEN: Soft, non-tender, non-distended MUSCULOSKELETAL:  No edema; No deformity  SKIN: Warm and dry NEUROLOGIC:  Alert and oriented x 3 PSYCHIATRIC:  Normal affect   ASSESSMENT:    1. Coronary artery disease involving native coronary artery of native heart without angina pectoris   2. PAD (peripheral artery disease) ([place])   3. Hypertriglyceridemia   4. S/P aortobifemoral bypass surgery    PLAN:    In order of problems listed above:  CAD Hyperlipidemia with LDL goal < 70 Prior PCI to LCX in 2000 Continue crestor 09/08/2021: Cholesterol 162; HDL 42; LDL Cholesterol (Calc) 83; Triglycerides 278 Start vacepa for triglycerides   Hypertension 100 mg losartan, 25 mg lopressor BID   PAD s/p aorto-bifemoral bypass surgery 2004 in [place] We follow her dopplers, last performed 04/2021 with stable  disease Next Dopplers to April 2023 with follow-up with Dr. Gwenlyn Found. Continue 2.5 mg xarelto BID. No bleeding.   Follow-up in 1 year after Dopplers.     Medication Adjustments/Labs and Tests Ordered: Current medicines are reviewed at length with the patient today.  Concerns regarding medicines are outlined above.  Orders Placed This Encounter  Procedures   EKG 12-Lead   Meds ordered this encounter  Medications   icosapent Ethyl (VASCEPA) 1 g capsule    Sig: Take 2 capsules (2 g total) by mouth 2 (two) times daily.    Dispense:  360 capsule    Refill:  3   losartan (COZAAR) 100 MG tablet    Sig: Take 1 tablet (100 mg total) by mouth daily.    Dispense:  90 tablet    Refill:  3   metoprolol tartrate (LOPRESSOR) 25 MG tablet    Sig: TAKE 1 TABLET BY MOUTH IN THE MORNING AND 1/2 TABLET IN THE EVENING    Dispense:  180 tablet    Refill:  3   rivaroxaban (XARELTO) 2.5 MG TABS tablet    Sig: Take 1 tablet (2.5 mg total) by mouth 2 (two) times daily.    Dispense:  180 tablet    Refill:  2    30 day supply   rosuvastatin (CRESTOR) 10 MG tablet    Sig: Take 1 tablet (10 mg total) by mouth daily.    Dispense:  90 tablet    Refill:  3    Patient Instructions  Medication Instructions:   -Start taking Icosapent ethyl (Vascepa) 2g (2 tablets) twice daily.  *If you need a refill on your cardiac medications before your next appointment, please call your pharmacy*   Testing/Procedures: Fabian Sharp, PA-C has recommended that you have an Ultrasound of your AORTA/IVC/ILIACS.   To prepare for this test:  No food after 11PM the night before. Water is OK. (Don't drink liquids if you have been instructed not to for ANOTHER test).  Avoid foods that produce bowel gas, for  24 hours prior to exam (see below). No breakfast, no chewing gum, no smoking or carbonated beverages. Patient may take morning medications with water. Come in for test at least 15 minutes early to register. To be done  in March 2024. This procedure will be done at [place]  Your physician has requested that you have an ankle brachial index (ABI). During this test an ultrasound and blood pressure cuff are used to evaluate the arteries that supply the arms and legs with blood. Allow thirty minutes for this exam. There are no restrictions or special instructions. To be done in March 2024. This procedure will be done at [place]    Follow-Up: At [facility], you and your health needs are our priority.  As part of our continuing mission to provide you with exceptional heart care, we have created designated Provider Care Teams.  These Care Teams include your primary Cardiologist (physician) and Advanced Practice Providers (APPs -  Physician Assistants and Nurse Practitioners) who all work together to provide you with the care you need, when you need it.  We recommend signing up for the patient portal called "MyChart".  Sign up information is provided on this After Visit Summary.  MyChart is used to connect with patients for Virtual Visits (Telemedicine).  Patients are able to view lab/test results, encounter notes, upcoming appointments, etc.  Non-urgent messages can be sent to your provider as well.   To learn more about what you can do with MyChart, go to [email or web address].    Your next appointment:   6 month(s)  The format for your next appointment:   In Person  Provider:   Quay Burow, MD    Signed, [place], [place]  10/08/2021 12:10 PM    [place]

## 2021-10-08 ENCOUNTER — Ambulatory Visit: Payer: Medicare Other | Attending: Physician Assistant | Admitting: Physician Assistant

## 2021-10-08 ENCOUNTER — Encounter: Payer: Self-pay | Admitting: Physician Assistant

## 2021-10-08 VITALS — BP 126/60 | HR 84 | Ht 61.0 in | Wt 105.4 lb

## 2021-10-08 DIAGNOSIS — Z95828 Presence of other vascular implants and grafts: Secondary | ICD-10-CM | POA: Diagnosis not present

## 2021-10-08 DIAGNOSIS — I739 Peripheral vascular disease, unspecified: Secondary | ICD-10-CM

## 2021-10-08 DIAGNOSIS — I2510 Atherosclerotic heart disease of native coronary artery without angina pectoris: Secondary | ICD-10-CM | POA: Diagnosis not present

## 2021-10-08 DIAGNOSIS — E781 Pure hyperglyceridemia: Secondary | ICD-10-CM | POA: Diagnosis not present

## 2021-10-08 MED ORDER — METOPROLOL TARTRATE 25 MG PO TABS: ORAL_TABLET | ORAL | 3 refills | Status: AC

## 2021-10-08 MED ORDER — LOSARTAN POTASSIUM 100 MG PO TABS: 100.0000 mg | ORAL_TABLET | Freq: Every day | ORAL | 3 refills | Status: AC

## 2021-10-08 MED ORDER — ICOSAPENT ETHYL 1 G PO CAPS: 2.0000 g | ORAL_CAPSULE | Freq: Two times a day (BID) | ORAL | 3 refills | Status: DC

## 2021-10-08 MED ORDER — XARELTO 2.5 MG PO TABS: 2.5000 mg | ORAL_TABLET | Freq: Two times a day (BID) | ORAL | 2 refills | Status: DC

## 2021-10-08 MED ORDER — ROSUVASTATIN CALCIUM 10 MG PO TABS: 10.0000 mg | ORAL_TABLET | Freq: Every day | ORAL | 3 refills | Status: DC

## 2021-10-08 NOTE — Patient Instructions (Signed)
Medication Instructions:   -Start taking Icosapent ethyl (Vascepa) 2g (2 tablets) twice daily.  *If you need a refill on your cardiac medications before your next appointment, please call your pharmacy*   Testing/Procedures: Fabian Sharp, PA-C has recommended that you have an Ultrasound of your AORTA/IVC/ILIACS.   To prepare for this test:  No food after 11PM the night before. Water is OK. (Don't drink liquids if you have been instructed not to for ANOTHER test).  Avoid foods that produce bowel gas, for 24 hours prior to exam (see below). No breakfast, no chewing gum, no smoking or carbonated beverages. Patient may take morning medications with water. Come in for test at least 15 minutes early to register. To be done in March 2024. This procedure will be done at [place]  Your physician has requested that you have an ankle brachial index (ABI). During this test an ultrasound and blood pressure cuff are used to evaluate the arteries that supply the arms and legs with blood. Allow thirty minutes for this exam. There are no restrictions or special instructions. To be done in March 2024. This procedure will be done at [place]    Follow-Up: At [facility], you and your health needs are our priority.  As part of our continuing mission to provide you with exceptional heart care, we have created designated Provider Care Teams.  These Care Teams include your primary Cardiologist (physician) and Advanced Practice Providers (APPs -  Physician Assistants and Nurse Practitioners) who all work together to provide you with the care you need, when you need it.  We recommend signing up for the patient portal called "MyChart".  Sign up information is provided on this After Visit Summary.  MyChart is used to connect with patients for Virtual Visits (Telemedicine).  Patients are able to view lab/test results, encounter notes, upcoming appointments, etc.   Non-urgent messages can be sent to your provider as well.   To learn more about what you can do with MyChart, go to [email or web address].    Your next appointment:   6 month(s)  The format for your next appointment:   In Person  Provider:   Quay Burow, MD

## 2021-10-29 ENCOUNTER — Other Ambulatory Visit: Payer: Self-pay | Admitting: Nurse Practitioner

## 2021-10-29 DIAGNOSIS — E0865 Diabetes mellitus due to underlying condition with hyperglycemia: Secondary | ICD-10-CM

## 2021-10-29 DIAGNOSIS — M0609 Rheumatoid arthritis without rheumatoid factor, multiple sites: Secondary | ICD-10-CM | POA: Diagnosis not present

## 2021-10-29 DIAGNOSIS — M316 Other giant cell arteritis: Secondary | ICD-10-CM | POA: Diagnosis not present

## 2021-10-29 NOTE — Telephone Encounter (Signed)
Left message on voicemail for patient to return call when available to confirm order request as it is not specified on her medication list

## 2021-10-29 NOTE — Telephone Encounter (Signed)
Spoke with [name] and confirmed request

## 2021-11-03 ENCOUNTER — Other Ambulatory Visit: Payer: Self-pay | Admitting: Nurse Practitioner

## 2021-11-03 DIAGNOSIS — E0865 Diabetes mellitus due to underlying condition with hyperglycemia: Secondary | ICD-10-CM

## 2021-11-30 DIAGNOSIS — M0609 Rheumatoid arthritis without rheumatoid factor, multiple sites: Secondary | ICD-10-CM | POA: Diagnosis not present

## 2021-11-30 DIAGNOSIS — M316 Other giant cell arteritis: Secondary | ICD-10-CM | POA: Diagnosis not present

## 2021-12-08 ENCOUNTER — Ambulatory Visit (INDEPENDENT_AMBULATORY_CARE_PROVIDER_SITE_OTHER): Payer: Medicare Other

## 2021-12-08 DIAGNOSIS — Z23 Encounter for immunization: Secondary | ICD-10-CM | POA: Diagnosis not present

## 2021-12-08 DIAGNOSIS — M810 Age-related osteoporosis without current pathological fracture: Secondary | ICD-10-CM | POA: Diagnosis not present

## 2021-12-08 MED ORDER — DENOSUMAB 60 MG/ML ~~LOC~~ SOSY
60.0000 mg | PREFILLED_SYRINGE | Freq: Once | SUBCUTANEOUS | Status: AC
  Administered 2021-12-08: 60 mg via SUBCUTANEOUS

## 2021-12-28 DIAGNOSIS — M316 Other giant cell arteritis: Secondary | ICD-10-CM | POA: Diagnosis not present

## 2022-01-15 ENCOUNTER — Ambulatory Visit (INDEPENDENT_AMBULATORY_CARE_PROVIDER_SITE_OTHER): Payer: Medicare Other | Admitting: Nurse Practitioner

## 2022-01-15 ENCOUNTER — Encounter: Payer: Self-pay | Admitting: Nurse Practitioner

## 2022-01-15 VITALS — Ht 61.0 in | Wt 110.0 lb

## 2022-01-15 DIAGNOSIS — Z0000 Encounter for general adult medical examination without abnormal findings: Secondary | ICD-10-CM

## 2022-01-15 NOTE — Patient Instructions (Signed)
Anne Pena , Thank you for taking time to come for your Medicare Wellness Visit. I appreciate your ongoing commitment to your health goals. Please review the following plan we discussed and let me know if I can assist you in the future.   Screening recommendations/referrals: Colonoscopy aged out Mammogram aged out Bone Density up to date Recommended yearly ophthalmology/optometry visit for glaucoma screening and checkup Recommended yearly dental visit for hygiene and checkup  Vaccinations: Influenza vaccine- due annually in September/October Pneumococcal vaccine up to date Tdap vaccine up to date Shingles vaccine up to date    Advanced directives: on file  Conditions/risks identified: advanced age  Next appointment: yearly    Preventive Care 82 Years and Older, Female Preventive care refers to lifestyle choices and visits with your health care provider that can promote health and wellness. What does preventive care include? A yearly physical exam. This is also called an annual well check. Dental exams once or twice a year. Routine eye exams. Ask your health care provider how often you should have your eyes checked. Personal lifestyle choices, including: Daily care of your teeth and gums. Regular physical activity. Eating a healthy diet. Avoiding tobacco and drug use. Limiting alcohol use. Practicing safe sex. Taking low-dose aspirin every day. Taking vitamin and mineral supplements as recommended by your health care provider. What happens during an annual well check? The services and screenings done by your health care provider during your annual well check will depend on your age, overall health, lifestyle risk factors, and family history of disease. Counseling  Your health care provider may ask you questions about your: Alcohol use. Tobacco use. Drug use. Emotional well-being. Home and relationship well-being. Sexual activity. Eating habits. History of  falls. Memory and ability to understand (cognition). Work and work Statistician. Reproductive health. Screening  You may have the following tests or measurements: Height, weight, and BMI. Blood pressure. Lipid and cholesterol levels. These may be checked every 5 years, or more frequently if you are over 52 years old. Skin check. Lung cancer screening. You may have this screening every year starting at age 41 if you have a 30-pack-year history of smoking and currently smoke or have quit within the past 15 years. Fecal occult blood test (FOBT) of the stool. You may have this test every year starting at age 42. Flexible sigmoidoscopy or colonoscopy. You may have a sigmoidoscopy every 5 years or a colonoscopy every 10 years starting at age 48. Hepatitis C blood test. Hepatitis B blood test. Sexually transmitted disease (STD) testing. Diabetes screening. This is done by checking your blood sugar (glucose) after you have not eaten for a while (fasting). You may have this done every 1-3 years. Bone density scan. This is done to screen for osteoporosis. You may have this done starting at age 87. Mammogram. This may be done every 1-2 years. Talk to your health care provider about how often you should have regular mammograms. Talk with your health care provider about your test results, treatment options, and if necessary, the need for more tests. Vaccines  Your health care provider may recommend certain vaccines, such as: Influenza vaccine. This is recommended every year. Tetanus, diphtheria, and acellular pertussis (Tdap, Td) vaccine. You may need a Td booster every 10 years. Zoster vaccine. You may need this after age 58. Pneumococcal 13-valent conjugate (PCV13) vaccine. One dose is recommended after age 49. Pneumococcal polysaccharide (PPSV23) vaccine. One dose is recommended after age 43. Talk to your health care provider about  which screenings and vaccines you need and how often you need  them. This information is not intended to replace advice given to you by your health care provider. Make sure you discuss any questions you have with your health care provider. Document Released: 02/21/2015 Document Revised: 10/15/2015 Document Reviewed: 11/26/2014 Elsevier Interactive Patient Education  2017 [place] Prevention in the Home Falls can cause injuries. They can happen to people of all ages. There are many things you can do to make your home safe and to help prevent falls. What can I do on the outside of my home? Regularly fix the edges of walkways and driveways and fix any cracks. Remove anything that might make you trip as you walk through a door, such as a raised step or threshold. Trim any bushes or trees on the path to your home. Use bright outdoor lighting. Clear any walking paths of anything that might make someone trip, such as rocks or tools. Regularly check to see if handrails are loose or broken. Make sure that both sides of any steps have handrails. Any raised decks and porches should have guardrails on the edges. Have any leaves, snow, or ice cleared regularly. Use sand or salt on walking paths during winter. Clean up any spills in your garage right away. This includes oil or grease spills. What can I do in the bathroom? Use night lights. Install grab bars by the toilet and in the tub and shower. Do not use towel bars as grab bars. Use non-skid mats or decals in the tub or shower. If you need to sit down in the shower, use a plastic, non-slip stool. Keep the floor dry. Clean up any water that spills on the floor as soon as it happens. Remove soap buildup in the tub or shower regularly. Attach bath mats securely with double-sided non-slip rug tape. Do not have throw rugs and other things on the floor that can make you trip. What can I do in the bedroom? Use night lights. Make sure that you have a light by your bed that is easy to reach. Do not use  any sheets or blankets that are too big for your bed. They should not hang down onto the floor. Have a firm chair that has side arms. You can use this for support while you get dressed. Do not have throw rugs and other things on the floor that can make you trip. What can I do in the kitchen? Clean up any spills right away. Avoid walking on wet floors. Keep items that you use a lot in easy-to-reach places. If you need to reach something above you, use a strong step stool that has a grab bar. Keep electrical cords out of the way. Do not use floor polish or wax that makes floors slippery. If you must use wax, use non-skid floor wax. Do not have throw rugs and other things on the floor that can make you trip. What can I do with my stairs? Do not leave any items on the stairs. Make sure that there are handrails on both sides of the stairs and use them. Fix handrails that are broken or loose. Make sure that handrails are as long as the stairways. Check any carpeting to make sure that it is firmly attached to the stairs. Fix any carpet that is loose or worn. Avoid having throw rugs at the top or bottom of the stairs. If you do have throw rugs, attach them to the floor with  carpet tape. Make sure that you have a light switch at the top of the stairs and the bottom of the stairs. If you do not have them, ask someone to add them for you. What else can I do to help prevent falls? Wear shoes that: Do not have high heels. Have rubber bottoms. Are comfortable and fit you well. Are closed at the toe. Do not wear sandals. If you use a stepladder: Make sure that it is fully opened. Do not climb a closed stepladder. Make sure that both sides of the stepladder are locked into place. Ask someone to hold it for you, if possible. Clearly mark and make sure that you can see: Any grab bars or handrails. First and last steps. Where the edge of each step is. Use tools that help you move around (mobility aids)  if they are needed. These include: Canes. Walkers. Scooters. Crutches. Turn on the lights when you go into a dark area. Replace any light bulbs as soon as they burn out. Set up your furniture so you have a clear path. Avoid moving your furniture around. If any of your floors are uneven, fix them. If there are any pets around you, be aware of where they are. Review your medicines with your doctor. Some medicines can make you feel dizzy. This can increase your chance of falling. Ask your doctor what other things that you can do to help prevent falls. This information is not intended to replace advice given to you by your health care provider. Make sure you discuss any questions you have with your health care provider. Document Released: 11/21/2008 Document Revised: 07/03/2015 Document Reviewed: 03/01/2014 Elsevier Interactive Patient Education  2017 [employer].

## 2022-01-15 NOTE — Progress Notes (Signed)
Subjective:   Anne Pena is a 87 y.o. female who presents for Medicare Annual (Subsequent) preventive examination.  Review of Systems     Cardiac Risk Factors include: family history of premature cardiovascular disease;sedentary lifestyle;hypertension;advanced age (>71mn, >>45women);diabetes mellitus;dyslipidemia     Objective:    Today's Vitals   01/15/22 1435  Weight: 110 lb (49.9 kg)  Height: _0  (1.549 m)  PainSc: 0-No pain   Body mass index is 20.78 kg/m.     09/11/2021    9:52 AM 03/06/2021    1:08 PM 01/09/2021    1:31 PM 09/05/2020    3:46 PM 04/23/2020    2:13 PM 10/24/2019    3:14 PM 09/04/2019   11:25 AM  Advanced Directives  Does Patient Have a Medical Advance Directive? _1  Yes Yes  Type of Advance Directive Living will Living will Living will Living will HCaseyLiving will Living will Living will  Does patient want to make changes to medical advance directive? No - Patient declined No - Patient declined No - Patient declined No - Patient declined No - Patient declined No - Patient declined No - Patient declined  Copy of HG[place]in Chart?     Yes - validated most recent copy scanned in chart (See row information)      Current Medications (verified) Outpatient Encounter Medications as of 01/15/2022  Medication Sig   b complex vitamins tablet Take 1 tablet by mouth daily.   Cholecalciferol (VITAMIN D3) 50 MCG (2000 UT) TABS Take 1 tablet by mouth every other day.   denosumab (PROLIA) 60 MG/ML SOSY injection Inject 60 mg into the skin every 6 (six) months.   icosapent Ethyl (VASCEPA) 1 g capsule Take 2 capsules (2 g total) by mouth 2 (two) times daily.   losartan (COZAAR) 100 MG tablet Take 1 tablet (100 mg total) by mouth daily.   metFORMIN (GLUCOPHAGE) 500 MG tablet TAKE 2 TABLETS BY MOUTH IN THE MORNING AND 1 TABLET WITH EVENING MEAL   metoprolol tartrate (LOPRESSOR) 25 MG tablet TAKE 1 TABLET BY  MOUTH IN THE MORNING AND 1/2 TABLET IN THE EVENING   mirtazapine (REMERON) 15 MG tablet TAKE 2 TABLETS (30 MG TOTAL) BY MOUTH AT BEDTIME.   Multiple Vitamins-Minerals (ICAPS AREDS 2) CAPS Take 2 capsules by mouth daily. Every morning and every evening   Multiple Vitamins-Minerals (WOMENS MULTI GUMMIES PO) Take 1 tablet by mouth in the morning and at bedtime.   omeprazole (PRILOSEC) 20 MG capsule TAKE 1 CAPSULE BY MOUTH DAILY.   ONETOUCH ULTRA test strip USE 1 STRIP TO CHECK GLUCOSE ONCE DAILY   rivaroxaban (XARELTO) 2.5 MG TABS tablet Take 1 tablet (2.5 mg total) by mouth 2 (two) times daily.   rosuvastatin (CRESTOR) 10 MG tablet Take 1 tablet (10 mg total) by mouth daily.   Ascorbic Acid (VITAMIN C PO) Take 1 tablet by mouth as directed. Seasonally late fall- winter (Patient not taking: Reported on 01/15/2022)   Calcium Carbonate (CALCIUM 500 PO) Take 1 tablet by mouth in the morning and at bedtime. Only 2 weeks prior to Prolia injection and 2 weeks after (Patient not taking: Reported on 01/15/2022)   Tocilizumab (ACTEMRA IV) Inject into the vein every 30 (thirty) days. Administered by rheumatologist for autoimmune disease (Patient not taking: Reported on 01/15/2022)   No facility-administered encounter medications on file as of 01/15/2022.    Allergies (verified) Benadryl [diphenhydramine], Lipitor [atorvastatin], and Tricor [fenofibrate]  History: Past Medical History:  Diagnosis Date   Atherosclerosis of aorta ([place])    s/p AO bypass 2004, Per records received from [facility]    Bladder cancer [facility]) 02/10/2010   CAD (coronary artery disease)    Coronary intervention [facility] in 2000.   Diabetes mellitus without complication ([place])    Former tobacco use    Per records received from [place] (giant cell arteritis) ([place])    Giant cell arteritis ([place])    Per records received from [facility]    Hyperlipidemia    Hypertension    Macular  degeneration    Osteoporosis    Prolia injection administered on 05/09/2019, Per records received from [facility]    PAD (peripheral artery disease) [facility])    s/p bilateral iliac-femoral artery bypass 2000, Per records received from [facility]umatology    Peripheral vascular disease of lower extremity ([place])    aortobifemoral bypass grafting at [facility] in [place] in 2004.    Presence of stent in left circumflex coronary artery    Rheumatoid arthritis [facility])    Per records received from [facility] Rheumatology    Past Surgical History:  Procedure Laterality Date   AORTO-FEMORAL BYPASS GRAFT     aortobifemoral bypass grafting at [facility] in [place] in 2004.    CATARACT EXTRACTION     HERNIA REPAIR     KIDNEY SURGERY  04/06/2011   URETER SURGERY  05/05/2011   Family History  Problem Relation Age of Onset   Stroke Mother    Hypertension Mother        Per records received from [facility] Rheumatology    Lung cancer Father    Heart attack Brother    Hyperlipidemia Brother    Hypertension Brother    Diabetes Maternal Uncle    Diabetes Maternal Grandmother    Hypertension Son        Per records received from [place] History   Socioeconomic History   Marital status: Widowed    Spouse name: Not on file   Number of children: Not on file   Years of education: Not on file   Highest education level: Not on file  Occupational History   Not on file  Tobacco Use   Smoking status: Former    Packs/day: 1.00    Years: 64.00    Total pack years: 64.00    Types: Cigarettes    Quit date: 04/06/2011    Years since quitting: 10.7   Smokeless tobacco: Never  Vaping Use   Vaping Use: Never used  Substance and Sexual Activity   Alcohol use: Yes    Comment: Rarely    Drug use: Never   Sexual activity: Not Currently  Other Topics Concern   Not on file  Social History Narrative   Not on file   Social Determinants of Health   Financial  Resource Strain: Not on file  Food Insecurity: Not on file  Transportation Needs: Not on file  Physical Activity: Not on file  Stress: Not on file  Social Connections: Not on file    Tobacco Counseling Counseling given: Not Answered   Clinical Intake:  Pre-visit preparation completed: Yes  Pain Score: 0-No pain     BMI - recorded: 20.78 Nutritional Status: BMI of 19-24  Normal Nutritional Risks: None Diabetes: Yes  How often do you need to have someone help you when you read instructions, pamphlets, or other written materials from your doctor  or pharmacy?: 2 - Rarely  Diabetic?no         Activities of Daily Living    01/15/2022    2:59 PM  In your present state of health, do you have any difficulty performing the following activities:  Hearing? 0  Vision? 1  Difficulty concentrating or making decisions? 0  Walking or climbing stairs? 0  Dressing or bathing? 0  Doing errands, shopping? 1  Comment unable to drive, family helps  Preparing Food and eating ? N  Using the Toilet? N  In the past six months, have you accidently leaked urine? Y  Comment once  Do you have problems with loss of bowel control? N  Managing your Medications? N  Managing your Finances? N  Housekeeping or managing your Housekeeping? N    Patient Care Team: Wardell Honour, MD as PCP - General (Family Medicine) [name], MD as PCP - Cardiology (Cardiology) [name], MD as Consulting Physician (Ophthalmology) [facility], Tami Lin, [place] as Physician Assistant (Cardiology)  Indicate any recent Medical Services you may have received from other than [facility] providers in the past year (date may be approximate).     Assessment:   This is a routine wellness examination for [place].  Hearing/Vision screen No results found.  Dietary issues and exercise activities discussed: Current Exercise Habits: The patient does not participate in regular exercise at present   Goals Addressed    None    Depression Screen    01/15/2022    2:39 PM 03/06/2021    1:08 PM 01/09/2021    1:30 PM 04/23/2020    2:21 PM 09/04/2019   11:23 AM 07/11/2019    1:22 PM 12/19/2018    3:02 PM  PHQ 2/9 Scores  PHQ - 2 Score 1 0 0 0 0 0 2  PHQ- 9 Score 1      3    Fall Risk    01/15/2022    2:39 PM 09/11/2021   12:54 PM 09/11/2021    9:44 AM 03/06/2021    1:07 PM 01/09/2021    1:30 PM  [place] in the past year? 0 0 0 0 0  Number falls in past yr: 0 0 0 0 0  Injury with Fall? 0 0 0 0 0  Risk for fall due to : _0   Follow up Falls evaluation completed  Falls evaluation completed Falls evaluation completed Falls evaluation completed    [place]:  Any stairs in or around the home? Yes  If so, are there any without handrails? No  Home free of loose throw rugs in walkways, pet beds, electrical cords, etc? Yes  Adequate lighting in your home to reduce risk of falls? Yes   ASSISTIVE DEVICES UTILIZED TO PREVENT FALLS:  Life alert? No  Use of a cane, walker or w/c? No  Grab bars in the bathroom? Yes  Shower chair or bench in shower? No  Elevated toilet seat or a handicapped toilet? No   TIMED UP AND GO:  Was the test performed? No .   Cognitive Function:        01/15/2022    2:43 PM 01/09/2021    1:31 PM 09/04/2019   11:26 AM 12/19/2018    3:13 PM  6CIT Screen  What Year? 0 points 0 points 0 points 0 points  What month? 0 points 0  points 0 points 0 points  What time? 0 points 0 points 0 points 0 points  Count back from 20 2 points 0 points 0 points 0 points  Months in reverse 0 points 0 points 0 points 0 points  Repeat phrase 0 points 0 points 0 points 0 points  Total Score 2 points 0 points 0 points 0 points    Immunizations Immunization History  Administered Date(s) Administered   Fluad Quad(high Dose 65+) 10/24/2019, 12/08/2021   Influenza, High Dose Seasonal PF  11/14/2020   Influenza-Unspecified 10/18/2016, 12/08/2018   Moderna Sars-Covid-2 Vaccination 02/21/2019, 03/21/2019, 11/22/2019   Pfizer Covid-19 Vaccine Bivalent Booster 69yr & up 11/14/2020   Pneumococcal Conjugate (Pcv15) 11/14/2020   Pneumococcal Polysaccharide-23 09/17/2019   Tdap 09/17/2019   Zoster Recombinat (Shingrix) 09/17/2019, 11/22/2019    TDAP status: Up to date  Flu Vaccine status: Up to date  Pneumococcal vaccine status: Up to date  Covid-19 vaccine status: Information provided on how to obtain vaccines.   Qualifies for Shingles Vaccine? Yes   Zostavax completed No   Shingrix Completed?: Yes  Screening Tests Health Maintenance  Topic Date Due   Diabetic kidney evaluation - Urine ACR  12/19/2019   OPHTHALMOLOGY EXAM  07/24/2021   COVID-19 Vaccine (5 - 2023-24 season) 10/09/2021   FOOT EXAM  03/06/2022   HEMOGLOBIN A1C  03/11/2022   Diabetic kidney evaluation - eGFR measurement  09/09/2022   Medicare Annual Wellness (AWV)  01/16/2023   DTaP/Tdap/Td (2 - Td or Tdap) 09/16/2029   Pneumonia Vaccine 85 Years old  Completed   INFLUENZA VACCINE  Completed   DEXA SCAN  Completed   Zoster Vaccines- Shingrix  Completed   HPV VACCINES  Aged Out    Health Maintenance  Health Maintenance Due  Topic Date Due   Diabetic kidney evaluation - Urine ACR  12/19/2019   OPHTHALMOLOGY EXAM  07/24/2021   COVID-19 Vaccine (5 - 2023-24 season) 10/09/2021    Colorectal cancer screening: No longer required.   Mammogram status: No longer required due to age.  Bone Density status: Completed 2022. Results reflect: Bone density results: OSTEOPOROSIS. Repeat every 2 years.  Lung Cancer Screening: (Low Dose CT Chest recommended if Age 85-80years, 30 pack-year currently smoking OR have quit w/in 15years.) does not qualify.   Lung Cancer Screening Referral: na  Additional Screening:  Hepatitis C Screening: does not qualify; Completed na  Vision Screening: Recommended  annual ophthalmology exams for early detection of glaucoma and other disorders of the eye. Is the patient up to date with their annual eye exam?  Yes  Who is the provider or what is the name of the office in which the patient attends annual eye exams? [facility] If pt is not established with a provider, would they like to be referred to a provider to establish care? No .   Dental Screening: Recommended annual dental exams for proper oral hygiene  Community Resource Referral / Chronic Care Management: CRR required this visit?  No   CCM required this visit?  No      Plan:     I have personally reviewed and noted the following in the patient's chart:   Medical and social history Use of alcohol, tobacco or illicit drugs  Current medications and supplements including opioid prescriptions. Patient is not currently taking opioid prescriptions. Functional ability and status Nutritional status Physical activity Advanced directives List of other physicians Hospitalizations, surgeries, and ER visits in previous 12 months Vitals Screenings to include cognitive, depression,  and falls Referrals and appointments  In addition, I have reviewed and discussed with patient certain preventive protocols, quality metrics, and best practice recommendations. A written personalized care plan for preventive services as well as general preventive health recommendations were provided to patient.     [name], NP   01/15/2022   Virtual Visit via Telephone Note  I connected with patient 01/15/22 at  3:00 PM EST by telephone and verified that I am speaking with the correct person using two identifiers.  Location: Patient: home Provider: [place]   I discussed the limitations, risks, security and privacy concerns of performing an evaluation and management service by telephone and the availability of in person appointments. I also discussed with the patient that there may be a patient responsible charge  related to this service. The patient expressed understanding and agreed to proceed.   I discussed the assessment and treatment plan with the patient. The patient was provided an opportunity to ask questions and all were answered. The patient agreed with the plan and demonstrated an understanding of the instructions.   The patient was advised to call back or seek an in-person evaluation if the symptoms worsen or if the condition fails to improve as anticipated.  I provided 12 minutes of non-face-to-face time during this encounter.  [employer]. Harle Battiest Avs printed and mailed

## 2022-01-21 DIAGNOSIS — H43813 Vitreous degeneration, bilateral: Secondary | ICD-10-CM | POA: Diagnosis not present

## 2022-01-21 DIAGNOSIS — H353122 Nonexudative age-related macular degeneration, left eye, intermediate dry stage: Secondary | ICD-10-CM | POA: Diagnosis not present

## 2022-01-21 DIAGNOSIS — E119 Type 2 diabetes mellitus without complications: Secondary | ICD-10-CM | POA: Diagnosis not present

## 2022-01-21 DIAGNOSIS — H47012 Ischemic optic neuropathy, left eye: Secondary | ICD-10-CM | POA: Diagnosis not present

## 2022-01-21 DIAGNOSIS — H35372 Puckering of macula, left eye: Secondary | ICD-10-CM | POA: Diagnosis not present

## 2022-01-21 DIAGNOSIS — H353211 Exudative age-related macular degeneration, right eye, with active choroidal neovascularization: Secondary | ICD-10-CM | POA: Diagnosis not present

## 2022-01-25 DIAGNOSIS — M316 Other giant cell arteritis: Secondary | ICD-10-CM | POA: Diagnosis not present

## 2022-02-18 DIAGNOSIS — H353211 Exudative age-related macular degeneration, right eye, with active choroidal neovascularization: Secondary | ICD-10-CM | POA: Diagnosis not present

## 2022-02-23 DIAGNOSIS — M316 Other giant cell arteritis: Secondary | ICD-10-CM | POA: Diagnosis not present

## 2022-02-23 DIAGNOSIS — Z79899 Other long term (current) drug therapy: Secondary | ICD-10-CM | POA: Diagnosis not present

## 2022-02-23 DIAGNOSIS — M0609 Rheumatoid arthritis without rheumatoid factor, multiple sites: Secondary | ICD-10-CM | POA: Diagnosis not present

## 2022-03-10 ENCOUNTER — Other Ambulatory Visit: Payer: Medicare Other

## 2022-03-10 DIAGNOSIS — E538 Deficiency of other specified B group vitamins: Secondary | ICD-10-CM | POA: Diagnosis not present

## 2022-03-10 DIAGNOSIS — I25118 Atherosclerotic heart disease of native coronary artery with other forms of angina pectoris: Secondary | ICD-10-CM | POA: Diagnosis not present

## 2022-03-10 DIAGNOSIS — E781 Pure hyperglyceridemia: Secondary | ICD-10-CM

## 2022-03-10 DIAGNOSIS — E0822 Diabetes mellitus due to underlying condition with diabetic chronic kidney disease: Secondary | ICD-10-CM

## 2022-03-10 DIAGNOSIS — N1831 Chronic kidney disease, stage 3a: Secondary | ICD-10-CM | POA: Diagnosis not present

## 2022-03-10 DIAGNOSIS — M810 Age-related osteoporosis without current pathological fracture: Secondary | ICD-10-CM | POA: Diagnosis not present

## 2022-03-11 LAB — CBC WITH DIFFERENTIAL/PLATELET
Absolute Monocytes: 627 cells/uL (ref 200–950)
Basophils Absolute: 28 cells/uL (ref 0–200)
Basophils Relative: 0.5 %
Eosinophils Absolute: 121 cells/uL (ref 15–500)
Eosinophils Relative: 2.2 %
HCT: 38.1 % (ref 35.0–45.0)
Hemoglobin: 13.3 g/dL (ref 11.7–15.5)
Lymphs Abs: 1540 cells/uL (ref 850–3900)
MCH: 31.9 pg (ref 27.0–33.0)
MCHC: 34.9 g/dL (ref 32.0–36.0)
MCV: 91.4 fL (ref 80.0–100.0)
MPV: 11.2 fL (ref 7.5–12.5)
Monocytes Relative: 11.4 %
Neutro Abs: 3185 cells/uL (ref 1500–7800)
Neutrophils Relative %: 57.9 %
Platelets: 157 10*3/uL (ref 140–400)
RBC: 4.17 10*6/uL (ref 3.80–5.10)
RDW: 12.1 % (ref 11.0–15.0)
Total Lymphocyte: 28 %
WBC: 5.5 10*3/uL (ref 3.8–10.8)

## 2022-03-11 LAB — COMPLETE METABOLIC PANEL WITH GFR
AG Ratio: 2.4 (calc) (ref 1.0–2.5)
ALT: 12 U/L (ref 6–29)
AST: 13 U/L (ref 10–35)
Albumin: 4.4 g/dL (ref 3.6–5.1)
Alkaline phosphatase (APISO): 35 U/L — ABNORMAL LOW (ref 37–153)
BUN: 25 mg/dL (ref 7–25)
CO2: 28 mmol/L (ref 20–32)
Calcium: 9.6 mg/dL (ref 8.6–10.4)
Chloride: 108 mmol/L (ref 98–110)
Creat: 0.94 mg/dL (ref 0.60–0.95)
Globulin: 1.8 g/dL (calc) — ABNORMAL LOW (ref 1.9–3.7)
Glucose, Bld: 123 mg/dL — ABNORMAL HIGH (ref 65–99)
Potassium: 4.9 mmol/L (ref 3.5–5.3)
Sodium: 144 mmol/L (ref 135–146)
Total Bilirubin: 0.4 mg/dL (ref 0.2–1.2)
Total Protein: 6.2 g/dL (ref 6.1–8.1)
eGFR: 59 mL/min/{1.73_m2} — ABNORMAL LOW (ref >=60)

## 2022-03-11 LAB — HEMOGLOBIN A1C
Hgb A1c MFr Bld: 7 % of total Hgb — ABNORMAL HIGH (ref <5.7)
Mean Plasma Glucose: 154 mg/dL
eAG (mmol/L): 8.5 mmol/L

## 2022-03-11 LAB — LIPID PANEL
Cholesterol: 140 mg/dL (ref <200)
HDL: 38 mg/dL — ABNORMAL LOW (ref >=50)
LDL Cholesterol (Calc): 69 mg/dL (calc)
Non-HDL Cholesterol (Calc): 102 mg/dL (calc) (ref <130)
Total CHOL/HDL Ratio: 3.7 (calc) (ref <5.0)
Triglycerides: 249 mg/dL — ABNORMAL HIGH (ref <150)

## 2022-03-15 ENCOUNTER — Encounter: Payer: Self-pay | Admitting: Nurse Practitioner

## 2022-03-15 ENCOUNTER — Ambulatory Visit (INDEPENDENT_AMBULATORY_CARE_PROVIDER_SITE_OTHER): Payer: Medicare Other | Admitting: Nurse Practitioner

## 2022-03-15 VITALS — BP 144/80 | HR 70 | Temp 97.9°F | Ht 61.0 in | Wt 111.4 lb

## 2022-03-15 DIAGNOSIS — Z95828 Presence of other vascular implants and grafts: Secondary | ICD-10-CM

## 2022-03-15 DIAGNOSIS — E538 Deficiency of other specified B group vitamins: Secondary | ICD-10-CM | POA: Diagnosis not present

## 2022-03-15 DIAGNOSIS — E0822 Diabetes mellitus due to underlying condition with diabetic chronic kidney disease: Secondary | ICD-10-CM | POA: Diagnosis not present

## 2022-03-15 DIAGNOSIS — I739 Peripheral vascular disease, unspecified: Secondary | ICD-10-CM

## 2022-03-15 DIAGNOSIS — M810 Age-related osteoporosis without current pathological fracture: Secondary | ICD-10-CM | POA: Diagnosis not present

## 2022-03-15 DIAGNOSIS — G4700 Insomnia, unspecified: Secondary | ICD-10-CM

## 2022-03-15 DIAGNOSIS — M316 Other giant cell arteritis: Secondary | ICD-10-CM | POA: Diagnosis not present

## 2022-03-15 DIAGNOSIS — E781 Pure hyperglyceridemia: Secondary | ICD-10-CM

## 2022-03-15 DIAGNOSIS — I10 Essential (primary) hypertension: Secondary | ICD-10-CM | POA: Diagnosis not present

## 2022-03-15 DIAGNOSIS — I25118 Atherosclerotic heart disease of native coronary artery with other forms of angina pectoris: Secondary | ICD-10-CM

## 2022-03-15 DIAGNOSIS — F320 Major depressive disorder, single episode, mild: Secondary | ICD-10-CM

## 2022-03-15 DIAGNOSIS — N1831 Chronic kidney disease, stage 3a: Secondary | ICD-10-CM

## 2022-03-15 NOTE — Progress Notes (Unsigned)
Careteam: Patient Care Team: Wardell Honour, MD as PCP - General (Family Medicine) [name], MD as PCP - Cardiology (Cardiology) [name], MD as Consulting Physician (Ophthalmology) [facility], Tami Lin, [place] as Physician Assistant (Cardiology)  PLACE OF SERVICE:  [place] Directive information Does Patient Have a Medical Advance Directive?: Yes, Type of Advance Directive: Living will, Does patient want to make changes to medical advance directive?: No - Patient declined  Allergies  Allergen Reactions   Benadryl [Diphenhydramine]     Side effects per records received from [facility] Rheumatology    Lipitor [Atorvastatin]     rash   Tricor [Fenofibrate]     rash    Chief Complaint  Patient presents with   Medical Management of Chronic Issues    6 month follow-up, discuss labs(copy printed), and foot exam. Discuss need for diabetic kidney evaluation, eye exam, and additional covid boosters or post pone if patient refuses.      HPI: Patient is a 88 y.o. female here for routine follow-up.   Uses exercise bike at home. Used to walk but has some BIL hip pain, but it doesn't bother her enough to take medication and has declined ortho referral in the past.   Some swelling in her feet that is fine in the morning but becomes worse as the day goes on. Feet feel 'strange' sometimes, possibly numbness and tingling. She has a full PAD check-up coming up soon with Dr. Gwenlyn Found.   She is eating well and has gained a few lbs. She cooks at home for most meals and avoids fried and fatty foods. She has a little bit of a sweet tooth. She drinks mostly water.   Denies chest pain, shortness of breath, constipation or diarrhea. She takes a stool softener as needed and usually doesn't have any trouble. She denies dysuria. She urinates about 3 times a night but this is chronic and unchanged. She can go right back to sleep very easily and does not feel fatigued the next day.  Her sleep has been great with the remeron and the melatonin. No bleeding in stool or urine. Denies palpitations or dizziness.   Review of Systems:  Review of Systems  Constitutional:  Negative for chills, fever, malaise/fatigue and weight loss.  HENT:  Negative for congestion and sore throat.   Eyes:  Negative for blurred vision.  Respiratory:  Negative for cough, shortness of breath and wheezing.   Cardiovascular:  Positive for leg swelling. Negative for chest pain and palpitations.       In bilateral lower extremities that improves with elevation  Gastrointestinal:  Negative for abdominal pain, blood in stool, constipation, diarrhea, heartburn, nausea and vomiting.  Genitourinary:  Negative for dysuria, frequency, hematuria and urgency.  Musculoskeletal:  Negative for falls and joint pain.  Skin:  Negative for rash.  Neurological:  Negative for dizziness, tingling and headaches.  Endo/Heme/Allergies:  Negative for polydipsia.  Psychiatric/Behavioral:  Negative for depression. The patient is not nervous/anxious.   ***  Past Medical History:  Diagnosis Date   Atherosclerosis of aorta ([place])    s/p AO bypass 2004, Per records received from [facility] Rhe[facility]    Bladder cancer [facility]) 02/10/2010   CAD (coronary artery disease)    Coronary intervention [facility] in 2000.   Diabetes mellitus without complication ([place])    Former tobacco use    Per records received from [place] (giant cell arteritis) ([place])    Giant cell  arteritis ([place])    Per records received from [facility] Rheumatology    Hyperlipidemia    Hypertension    Macular degeneration    Osteoporosis    Prolia injection administered on 05/09/2019, Per records received from [facility] Rheumatology    PAD (peripheral artery disease) [facility])    s/p bilateral iliac-femoral artery bypass 2000, Per records received from [facility]ology    Peripheral vascular disease of lower extremity ([place])     aortobifemoral bypass grafting at [facility] in [place] in 2004.    Presence of stent in left circumflex coronary artery    Rheumatoid arthritis [facility])    Per records received from [facility] Rheumatology    Past Surgical History:  Procedure Laterality Date   AORTO-FEMORAL BYPASS GRAFT     aortobifemoral bypass grafting at [facility] in [place] in 2004.    CATARACT EXTRACTION     HERNIA REPAIR     KIDNEY SURGERY  04/06/2011   URETER SURGERY  05/05/2011   Social History:   reports that she quit smoking about 10 years ago. Her smoking use included cigarettes. She has a 64.00 pack-year smoking history. She has never used smokeless tobacco. She reports current alcohol use. She reports that she does not use drugs.  Family History  Problem Relation Age of Onset   Stroke Mother    Hypertension Mother        Per records received from [facility] Rheumatology    Lung cancer Father    Heart attack Brother    Hyperlipidemia Brother    Hypertension Brother    Diabetes Maternal Uncle    Diabetes Maternal Grandmother    Hypertension Son        Per records received from [place] Rheumatology    Medications: Patient's Medications  New Prescriptions   No medications on file  Previous Medications   B COMPLEX VITAMINS TABLET    Take 1 tablet by mouth daily.   CALCIUM CARBONATE (CALCIUM 500 PO)    Take 1 tablet by mouth in the morning and at bedtime. Only 2 weeks prior to Prolia injection and 2 weeks after   CHOLECALCIFEROL (VITAMIN D3) 50 MCG (2000 UT) TABS    Take 1 tablet by mouth every other day.   DENOSUMAB (PROLIA) 60 MG/ML SOSY INJECTION    Inject 60 mg into the skin every 6 (six) months.   ICOSAPENT ETHYL (VASCEPA) 1 G CAPSULE    Take 2 capsules (2 g total) by mouth 2 (two) times daily.   LOSARTAN (COZAAR) 100 MG TABLET    Take 1 tablet (100 mg total) by mouth daily.   METFORMIN (GLUCOPHAGE) 500 MG TABLET    TAKE 2 TABLETS BY MOUTH IN THE MORNING AND 1 TABLET WITH EVENING MEAL    METOPROLOL TARTRATE (LOPRESSOR) 25 MG TABLET    TAKE 1 TABLET BY MOUTH IN THE MORNING AND 1/2 TABLET IN THE EVENING   MIRTAZAPINE (REMERON) 15 MG TABLET    TAKE 2 TABLETS (30 MG TOTAL) BY MOUTH AT BEDTIME.   MULTIPLE VITAMINS-MINERALS (ICAPS AREDS 2) CAPS    Take 1 capsule by mouth 2 (two) times daily. Every morning and every evening   MULTIPLE VITAMINS-MINERALS (WOMENS MULTI GUMMIES PO)    Take 1 tablet by mouth in the morning and at bedtime.   OMEPRAZOLE (PRILOSEC) 20 MG CAPSULE    TAKE 1 CAPSULE BY MOUTH DAILY.   ONETOUCH ULTRA TEST STRIP    USE 1 STRIP TO CHECK GLUCOSE ONCE DAILY   RIVAROXABAN (  XARELTO) 2.5 MG TABS TABLET    Take 1 tablet (2.5 mg total) by mouth 2 (two) times daily.   ROSUVASTATIN (CRESTOR) 10 MG TABLET    Take 1 tablet (10 mg total) by mouth daily.   TOCILIZUMAB (ACTEMRA IV)    Inject into the vein every 30 (thirty) days. Administered by rheumatologist for autoimmune disease  Modified Medications   No medications on file  Discontinued Medications   ASCORBIC ACID (VITAMIN C PO)    Take 1 tablet by mouth as directed. Seasonally late fall- winter    Physical Exam:  Vitals:   03/15/22 1311 03/15/22 1314  BP: (!) 142/80 (!) 144/80  Pulse: 70   Temp: 97.9 F (36.6 C)   TempSrc: Temporal   SpO2: 95%   Weight: 111 lb 6.4 oz (50.5 kg)   Height: '5\' 1"'$  (1.549 m)    Body mass index is 21.05 kg/m. Wt Readings from Last 3 Encounters:  03/15/22 111 lb 6.4 oz (50.5 kg)  01/15/22 110 lb (49.9 kg)  10/08/21 105 lb 6.4 oz (47.8 kg)    Physical Exam Constitutional:      General: She is not in acute distress.    Appearance: Normal appearance.  Cardiovascular:     Rate and Rhythm: Normal rate and regular rhythm.  Pulmonary:     Effort: No respiratory distress.     Breath sounds: Normal breath sounds.  Abdominal:     General: Bowel sounds are normal. There is no distension.     Palpations: Abdomen is soft. There is no mass.     Tenderness: There is no abdominal  tenderness. There is no guarding.  Musculoskeletal:        General: No swelling.     Cervical back: Neck supple.     Right lower leg: Edema present.     Left lower leg: Edema present.     Comments: Nonpitting   Lymphadenopathy:     Cervical: No cervical adenopathy.  Skin:    General: Skin is warm and dry.  Neurological:     Mental Status: She is alert and oriented to person, place, and time.  Psychiatric:        Mood and Affect: Mood normal.   ***  Labs reviewed: Basic Metabolic Panel: Recent Labs    03/25/21 0000 09/08/21 0853 03/10/22 0913  NA 14* 142 144  K 5.3 4.8 4.9  CL 105 107 108  CO2 '17 26 28  '$ GLUCOSE  --  109* 123*  BUN 31* 33* 25  CREATININE 1.0 1.03* 0.94  CALCIUM 9.5 9.9 9.6   Liver Function Tests: Recent Labs    03/25/21 0000 09/08/21 0853 03/10/22 0913  AST '24 12 13  '$ ALT '15 14 12  '$ BILITOT  --  0.4 0.4  PROT  --  6.4 6.2   No results for input(s): "LIPASE", "AMYLASE" in the last 8760 hours. No results for input(s): "AMMONIA" in the last 8760 hours. CBC: Recent Labs    03/25/21 0000 09/08/21 0853 03/10/22 0913  WBC 5.8 5.2 5.5  NEUTROABS 3.50 2,590 3,185  HGB 13.4 13.4 13.3  HCT 39 39.6 38.1  MCV  --  93.6 91.4  PLT 151 176 157   Lipid Panel: Recent Labs    09/08/21 0853 03/10/22 0913  CHOL 162 140  HDL 42* 38*  LDLCALC 83 69  TRIG 278* 249*  CHOLHDL 3.9 3.7   TSH: No results for input(s): "TSH" in the last 8760 hours. A1C: Lab Results  Component Value Date   HGBA1C 7.0 (H) 03/10/2022     Assessment/Plan 1. Age-related osteoporosis without current pathological fracture Encouraged weight-bearing exercises but patient reports she is unable to due to hip pain; bike is all she can bear. She declines PT or referral to ortho at this time. Continue Prolia injections and calcium/vitamin D supplements. Monitor  2. Vitamin B12 deficiency Overall patient reports her energy and mood are great. Continue multivitamin  3.  Hypertriglyceridemia Currently monitored by cardiology; started Vascepa last fall. Improved. Continue diet and lifestyle modifications with rosuvasatin and Vascepa.  4. GCA (giant cell arteritis) ([place]) Follows with rheumatology for infusions. Monitor  5. Current mild episode of major depressive disorder, unspecified whether recurrent (HCC) Stable. Continue remeron.   6. Insomnia, unspecified type Patient endorses feeling well-rested. Happy on remeron and melatonin. Continue medications.   7. Coronary artery disease of native artery of native heart with stable angina pectoris [facility]) Follows with cardiology. Continue rosuvastatin, vascepa, xarelto, and metoprolol.   8. PAD (peripheral artery disease) ([place]) Follows with Dr. Gwenlyn Found. Encouraged patient to elevate legs when sitting and at night and to check the skin on her feet at least a few times a week  9. Essential hypertension BP was slightly elevated today. Recheck at home was. Continue metoprolol, losartan in combination with diet and lifestyle modifications. Monitor.  10. Diabetes mellitus due to underlying condition with stage 3a chronic kidney disease, without long-term current use of insulin (HCC) A1C and fasting glucose elevated on labs. Still at goal for age. Discussed cutting back on sweets and simple carbohydrates. Patient is in agreement, she will try to adjust diet.  - Microalbumin/Creatinine Ratio, Urine  11. S/P aorto-bifemoral bypass surgery Follows with Dr. Gwenlyn Found. Continue xarelto per cardiology recommendation. Appt coming up.   Return in about 6 months (around 09/13/2022) for routine follow up .: ***  Student- Archer Asa O'Berry ACPCNP-S  I personally was present during the history, physical exam and medical decision-making activities of this service and have verified that the service and findings are accurately documented in the student's note Azavion Bouillon K. Fidelis, [facility] [phone]

## 2022-03-15 NOTE — Patient Instructions (Signed)
To sign record release from Ophthalmology for diabetic eye exam

## 2022-03-16 LAB — MICROALBUMIN / CREATININE URINE RATIO
Creatinine, Urine: 57 mg/dL (ref 20–275)
Microalb Creat Ratio: 9 mcg/mg creat (ref <30)
Microalb, Ur: 0.5 mg/dL

## 2022-03-22 DIAGNOSIS — H43813 Vitreous degeneration, bilateral: Secondary | ICD-10-CM | POA: Diagnosis not present

## 2022-03-22 DIAGNOSIS — E119 Type 2 diabetes mellitus without complications: Secondary | ICD-10-CM | POA: Diagnosis not present

## 2022-03-22 DIAGNOSIS — H353122 Nonexudative age-related macular degeneration, left eye, intermediate dry stage: Secondary | ICD-10-CM | POA: Diagnosis not present

## 2022-03-22 DIAGNOSIS — H353211 Exudative age-related macular degeneration, right eye, with active choroidal neovascularization: Secondary | ICD-10-CM | POA: Diagnosis not present

## 2022-03-22 DIAGNOSIS — H35372 Puckering of macula, left eye: Secondary | ICD-10-CM | POA: Diagnosis not present

## 2022-03-22 DIAGNOSIS — H47012 Ischemic optic neuropathy, left eye: Secondary | ICD-10-CM | POA: Diagnosis not present

## 2022-03-23 DIAGNOSIS — M0609 Rheumatoid arthritis without rheumatoid factor, multiple sites: Secondary | ICD-10-CM | POA: Diagnosis not present

## 2022-03-23 DIAGNOSIS — D508 Other iron deficiency anemias: Secondary | ICD-10-CM | POA: Diagnosis not present

## 2022-03-23 DIAGNOSIS — Z79899 Other long term (current) drug therapy: Secondary | ICD-10-CM | POA: Diagnosis not present

## 2022-03-23 DIAGNOSIS — M316 Other giant cell arteritis: Secondary | ICD-10-CM | POA: Diagnosis not present

## 2022-03-23 DIAGNOSIS — R5382 Chronic fatigue, unspecified: Secondary | ICD-10-CM | POA: Diagnosis not present

## 2022-03-23 DIAGNOSIS — M810 Age-related osteoporosis without current pathological fracture: Secondary | ICD-10-CM | POA: Diagnosis not present

## 2022-03-23 DIAGNOSIS — Z6820 Body mass index (BMI) 20.0-20.9, adult: Secondary | ICD-10-CM | POA: Diagnosis not present

## 2022-03-25 DIAGNOSIS — M316 Other giant cell arteritis: Secondary | ICD-10-CM | POA: Diagnosis not present

## 2022-03-25 DIAGNOSIS — M0609 Rheumatoid arthritis without rheumatoid factor, multiple sites: Secondary | ICD-10-CM | POA: Diagnosis not present

## 2022-03-29 ENCOUNTER — Other Ambulatory Visit: Payer: Self-pay | Admitting: Nurse Practitioner

## 2022-03-29 DIAGNOSIS — E1169 Type 2 diabetes mellitus with other specified complication: Secondary | ICD-10-CM

## 2022-04-20 ENCOUNTER — Ambulatory Visit (HOSPITAL_COMMUNITY)
Admission: RE | Admit: 2022-04-20 | Discharge: 2022-04-20 | Disposition: A | Discharge status: Home / Self Care | Payer: Medicare Other | Source: Ambulatory Visit | Attending: Cardiovascular Disease | Admitting: Cardiovascular Disease

## 2022-04-20 ENCOUNTER — Ambulatory Visit (HOSPITAL_BASED_OUTPATIENT_CLINIC_OR_DEPARTMENT_OTHER)
Admission: RE | Admit: 2022-04-20 | Discharge: 2022-04-20 | Disposition: A | Discharge status: Home / Self Care | Payer: Medicare Other | Source: Ambulatory Visit | Attending: Cardiovascular Disease | Admitting: Cardiovascular Disease

## 2022-04-20 DIAGNOSIS — I739 Peripheral vascular disease, unspecified: Secondary | ICD-10-CM | POA: Insufficient documentation

## 2022-04-20 DIAGNOSIS — Z95828 Presence of other vascular implants and grafts: Secondary | ICD-10-CM

## 2022-04-20 LAB — VAS US ABI WITH/WO TBI
Left ABI: 0.94
Right ABI: 0.86

## 2022-04-22 DIAGNOSIS — M316 Other giant cell arteritis: Secondary | ICD-10-CM | POA: Diagnosis not present

## 2022-04-22 DIAGNOSIS — M0609 Rheumatoid arthritis without rheumatoid factor, multiple sites: Secondary | ICD-10-CM | POA: Diagnosis not present

## 2022-04-23 ENCOUNTER — Ambulatory Visit: Payer: Medicare Other | Attending: Cardiovascular Disease | Admitting: Cardiovascular Disease

## 2022-04-23 ENCOUNTER — Encounter: Payer: Self-pay | Admitting: Cardiovascular Disease

## 2022-04-23 VITALS — BP 142/72 | HR 75 | Ht 61.0 in | Wt 108.2 lb

## 2022-04-23 DIAGNOSIS — E782 Mixed hyperlipidemia: Secondary | ICD-10-CM | POA: Diagnosis not present

## 2022-04-23 DIAGNOSIS — Z95828 Presence of other vascular implants and grafts: Secondary | ICD-10-CM | POA: Insufficient documentation

## 2022-04-23 DIAGNOSIS — E1159 Type 2 diabetes mellitus with other circulatory complications: Secondary | ICD-10-CM | POA: Diagnosis not present

## 2022-04-23 DIAGNOSIS — Z955 Presence of coronary angioplasty implant and graft: Secondary | ICD-10-CM

## 2022-04-23 DIAGNOSIS — I152 Hypertension secondary to endocrine disorders: Secondary | ICD-10-CM

## 2022-04-23 DIAGNOSIS — E1169 Type 2 diabetes mellitus with other specified complication: Secondary | ICD-10-CM | POA: Diagnosis not present

## 2022-04-23 NOTE — Assessment & Plan Note (Signed)
History of hypertension with blood pressure measured today at 142/72.  She is on metoprolol and losartan.

## 2022-04-23 NOTE — Assessment & Plan Note (Signed)
History of PAD status post aortobifemoral bypass grafting at [facility] in [place] in 2004.  We follow her Doppler studies annually recently done 04/20/2022 revealing normal ABIs with mildly elevated velocities at the distal anastomosis unchanged from her last Doppler studies a year ago.  She denies claudication.

## 2022-04-23 NOTE — Progress Notes (Signed)
04/23/2022 Stepfanie Plazola   January 31, 1935  [id number]  Primary Physician Wardell Honour, MD Primary Cardiologist: [name] MD [name], [place]  HPI:  Anne Pena is a 88 y.o.   thin and fit appearing widowed Caucasian female mother of 79, grandmother and 3 grandchildren who is accompanied by 1 of her daughters [name], who she lives with.  She was referred by Dr. Raliegh Scarlet to be established in my practice because of known history of CAD and PAD.  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    I last saw her in the office 04/18/2020.  Risk factors include 50 pack years of tobacco abuse having quit 11 years ago, treated hypertension, diabetes and hyperlipidemia.  She is never had a heart attack or stroke.  She did have coronary intervention by Dr. Hale Drone at [facility] in 2000.  She had aortobifemoral bypass grafting at [facility] in [place] in 2004.  She denies chest pain, shortness of breath or claudication.  She was told that she did have moderate left ICA stenosis.   Since I saw her in the office a year and a half ago she continues to do well.  She has hired a Physiological scientist who she works out with once a week.  She denies chest pain, shortness of breath or claudication.  She does have restless legs syndrome type symptoms at night.  Her Dopplers of her aortobifem have remained stable..   Current Meds  Medication Sig   b complex vitamins tablet Take 1 tablet by mouth daily.   Calcium Carbonate (CALCIUM 500 PO) Take 1 tablet by mouth in the morning and at bedtime. Only 2 weeks prior to Prolia injection and 2 weeks after   Cholecalciferol (VITAMIN D3) 50 MCG (2000 UT) TABS Take 1 tablet by mouth every other day.   denosumab (PROLIA) 60 MG/ML SOSY injection Inject 60 mg into the skin every 6 (six) months.   icosapent Ethyl (VASCEPA) 1 g capsule Take 2 capsules (2 g total) by mouth 2 (two) times daily.   losartan (COZAAR) 100 MG tablet Take 1 tablet (100 mg  total) by mouth daily.   metFORMIN (GLUCOPHAGE) 500 MG tablet TAKE 2 TABLETS BY MOUTH IN THE MORNING AND 1 TABLET WITH EVENING MEAL   metoprolol tartrate (LOPRESSOR) 25 MG tablet TAKE 1 TABLET BY MOUTH IN THE MORNING AND 1/2 TABLET IN THE EVENING   mirtazapine (REMERON) 15 MG tablet TAKE 2 TABLETS (30 MG TOTAL) BY MOUTH AT BEDTIME.   Multiple Vitamins-Minerals (ICAPS AREDS 2) CAPS Take 1 capsule by mouth 2 (two) times daily. Every morning and every evening   Multiple Vitamins-Minerals (WOMENS MULTI GUMMIES PO) Take 1 tablet by mouth in the morning and at bedtime.   omeprazole (PRILOSEC) 20 MG capsule TAKE 1 CAPSULE BY MOUTH DAILY.   ONETOUCH ULTRA test strip USE 1 STRIP TO CHECK GLUCOSE ONCE DAILY   rivaroxaban (XARELTO) 2.5 MG TABS tablet Take 1 tablet (2.5 mg total) by mouth 2 (two) times daily.   rosuvastatin (CRESTOR) 10 MG tablet Take 1 tablet (10 mg total) by mouth daily.   Tocilizumab (ACTEMRA IV) Inject into the vein every 30 (thirty) days. Administered by rheumatologist for autoimmune disease     Allergies  Allergen Reactions   Benadryl [Diphenhydramine]     Side effects per records received from [facility] Rheumatology    Lipitor [Atorvastatin]     rash   Tricor [Fenofibrate]  rash    Social History   Socioeconomic History   Marital status: Widowed    Spouse name: Not on file   Number of children: Not on file   Years of education: Not on file   Highest education level: Not on file  Occupational History   Not on file  Tobacco Use   Smoking status: Former    Packs/day: 1.00    Years: 64.00    Additional pack years: 0.00    Total pack years: 64.00    Types: Cigarettes    Quit date: 04/06/2011    Years since quitting: 11.0   Smokeless tobacco: Never  Vaping Use   Vaping Use: Never used  Substance and Sexual Activity   Alcohol use: Yes    Comment: Rarely    Drug use: Never   Sexual activity: Not Currently  Other Topics Concern   Not on file  Social History  Narrative   Not on file   Social Determinants of Health   Financial Resource Strain: Not on file  Food Insecurity: Not on file  Transportation Needs: Not on file  Physical Activity: Not on file  Stress: Not on file  Social Connections: Not on file  Intimate Partner Violence: Not on file     Review of Systems: General: negative for chills, fever, night sweats or weight changes.  Cardiovascular: negative for chest pain, dyspnea on exertion, edema, orthopnea, palpitations, paroxysmal nocturnal dyspnea or shortness of breath Dermatological: negative for rash Respiratory: negative for cough or wheezing Urologic: negative for hematuria Abdominal: negative for nausea, vomiting, diarrhea, bright red blood per rectum, melena, or hematemesis Neurologic: negative for visual changes, syncope, or dizziness All other systems reviewed and are otherwise negative except as noted above.    Blood pressure (!) 142/72, pulse 75, height 5\' 1"  (1.549 m), weight 108 lb 3.2 oz (49.1 kg), SpO2 94 %.  General appearance: alert and no distress Neck: no adenopathy, no carotid bruit, no JVD, supple, symmetrical, trachea midline, and thyroid not enlarged, symmetric, no tenderness/mass/nodules Lungs: clear to auscultation bilaterally Heart: regular rate and rhythm, S1, S2 normal, no murmur, click, rub or gallop Extremities: extremities normal, atraumatic, no cyanosis or edema Pulses: 2+ and symmetric Skin: Skin color, texture, turgor normal. No rashes or lesions Neurologic: Grossly normal  EKG sinus rhythm at 75 with septal Q waves.  Personally reviewed this EKG.  ASSESSMENT AND PLAN:   Presence of stent in left circumflex coronary artery History of CAD status post coronary intervention by Dr. Wyatt Haste at [facility] in 2000.  She has been asymptomatic since.  Mixed diabetic hyperlipidemia associated with type 2 diabetes mellitus ([place]) History of hyperlipidemia on Crestor as well as Vascepa with lipid  profile performed 03/10/2022 revealing a total cholesterol 140, LDL 69 and HDL of 38.  She does have mild hypertriglyceridemia as well.  S/P aorto-bifemoral bypass surgery History of PAD status post aortobifemoral bypass grafting at [facility] in [place] in 2004.  We follow her Doppler studies annually recently done 04/20/2022 revealing normal ABIs with mildly elevated velocities at the distal anastomosis unchanged from her last Doppler studies a year ago.  She denies claudication.  Hypertension associated with diabetes ([place]) History of hypertension with blood pressure measured today at 142/72.  She is on metoprolol and losartan.     [name] MD FACP,FACC,FAHA, [facility] 04/23/2022 10:33 AM

## 2022-04-23 NOTE — Assessment & Plan Note (Signed)
History of hyperlipidemia on Crestor as well as Vascepa with lipid profile performed 03/10/2022 revealing a total cholesterol 140, LDL 69 and HDL of 38.  She does have mild hypertriglyceridemia as well.

## 2022-04-23 NOTE — Assessment & Plan Note (Signed)
History of CAD status post coronary intervention by Dr. Wyatt Haste at [facility] in 2000.  She has been asymptomatic since.

## 2022-04-23 NOTE — Patient Instructions (Signed)
Medication Instructions:  Your physician recommends that you continue on your current medications as directed. Please refer to the Current Medication list given to you today.  *If you need a refill on your cardiac medications before your next appointment, please call your pharmacy*   Testing/Procedures: Your physician has requested that you have an Aorta/Iliac Duplex. This will be take place at [place], [address].  No food after 11PM the night before.  Water is OK. (Don't drink liquids if you have been instructed not to for ANOTHER test) Avoid foods that produce bowel gas, for 24 hours prior to exam (see below). No breakfast, no chewing gum, no smoking or carbonated beverages. Patient may take morning medications with water. Come in for test at least 15 minutes early to register. To be done in March 2025.  Your physician has requested that you have an ankle brachial index (ABI). During this test an ultrasound and blood pressure cuff are used to evaluate the arteries that supply the arms and legs with blood. Allow thirty minutes for this exam. There are no restrictions or special instructions. This will take place at [place], [address].  To be done in March 2025.     Follow-Up: At [facility], you and your health needs are our priority.  As part of our continuing mission to provide you with exceptional heart care, we have created designated Provider Care Teams.  These Care Teams include your primary Cardiologist (physician) and Advanced Practice Providers (APPs -  Physician Assistants and Nurse Practitioners) who all work together to provide you with the care you need, when you need it.  We recommend signing up for the patient portal called "MyChart".  Sign up information is provided on this After Visit Summary.  MyChart is used to connect with patients for Virtual Visits (Telemedicine).  Patients are able to view lab/test results, encounter notes, upcoming  appointments, etc.  Non-urgent messages can be sent to your provider as well.   To learn more about what you can do with MyChart, go to [email or web address].    Your next appointment:   12 month(s)  Provider:   Quay Burow, MD

## 2022-05-13 ENCOUNTER — Telehealth: Payer: Self-pay | Admitting: *Deleted

## 2022-05-13 ENCOUNTER — Other Ambulatory Visit: Payer: Self-pay | Admitting: Nurse Practitioner

## 2022-05-13 NOTE — Telephone Encounter (Signed)
Daughter called back and scheduled patient to come in 06/10/2022

## 2022-05-13 NOTE — Telephone Encounter (Signed)
Patient is due for Prolia Injection after April 30th. I called daughter to schedule an appointment  No appointment scheduled as of today.   Tried calling daughter and LMOM to return call to schedule a Prolia Injection.  Last Injection was 12/08/2021.

## 2022-05-16 IMAGING — CT CT HEAD W/O CM
1 series · 15 of 30 positions shown, 19 images · non-contrast
Comparison: No pertinent prior exams are available for comparison.

CLINICAL DATA: Dizziness. Memory loss. Dizziness, nonspecific.
Headache, tension type. Additional history provided: Patient reports
dizziness and headaches, gait disturbance, symptoms for several
months.

EXAM:
CT HEAD WITHOUT CONTRAST
TECHNIQUE: Contiguous axial images were obtained from the base of the skull
through the vertex without intravenous contrast.

[Series 2: head w/(date) · axial · 0.44mm/px · z∈[-203,-63]mm · 15 of 32 slices shown, 19 images]
[im 2/32  brain]
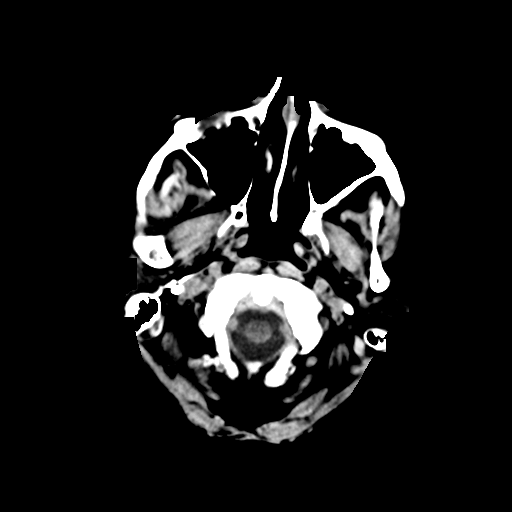
[im 2/32  bone]
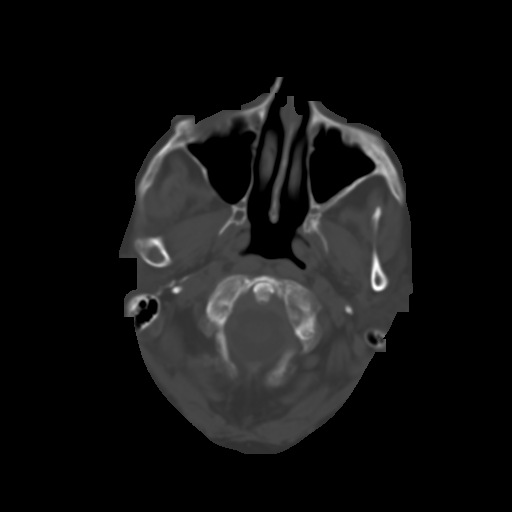
[im 4/32  brain]
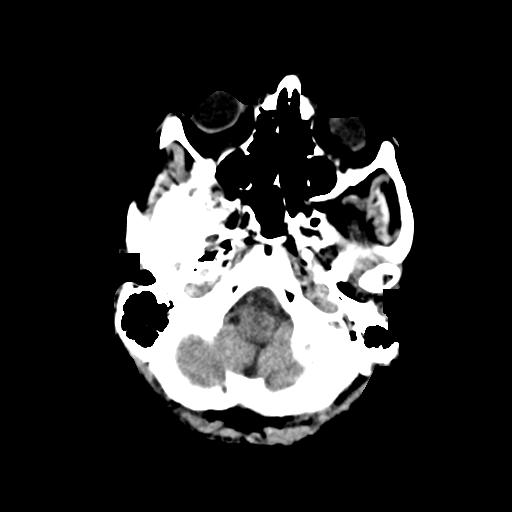
[im 6/32  brain]
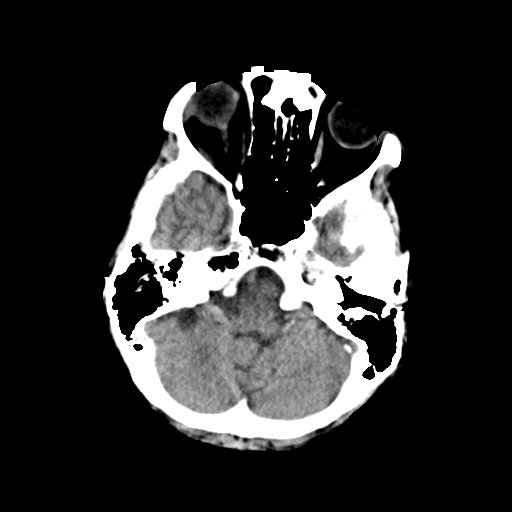
[im 8/32  brain]
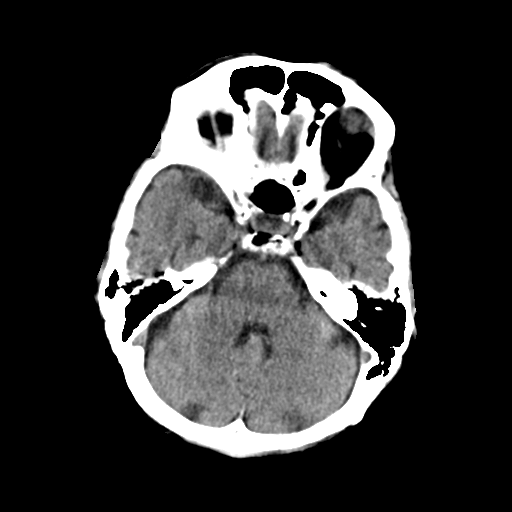
[im 10/32  brain]
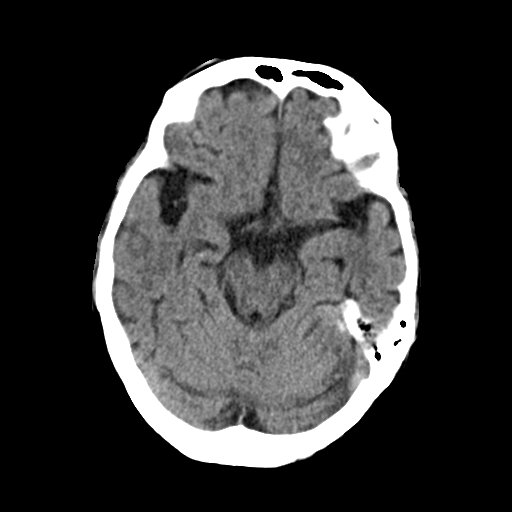
[im 10/32  bone]
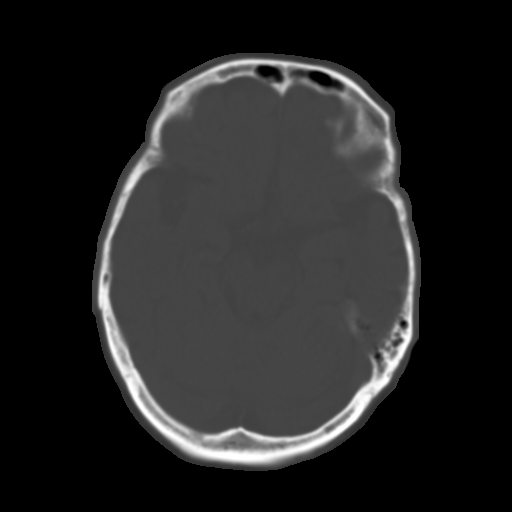
[im 12/32  brain]
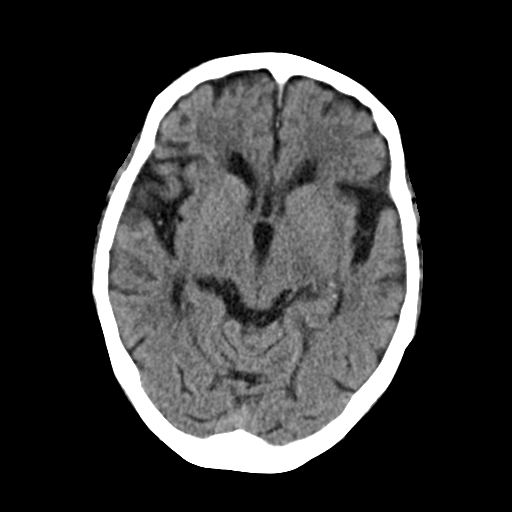
[im 14/32  brain]
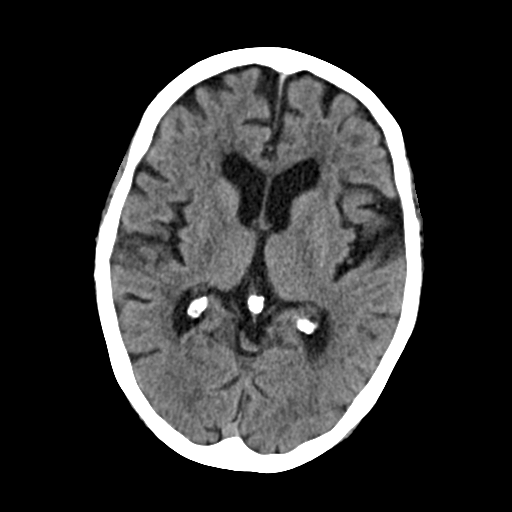
[im 17/32  brain]
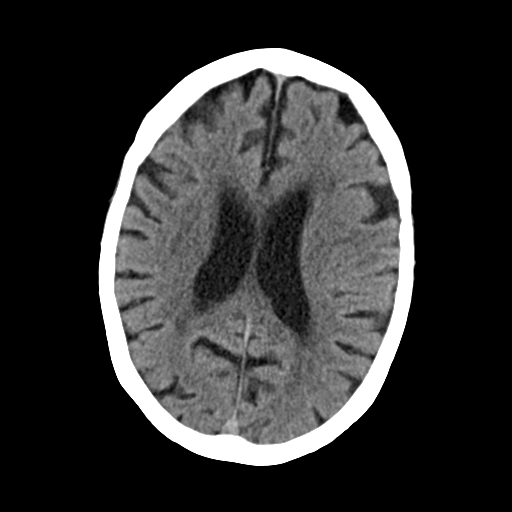
[im 18/32  brain]
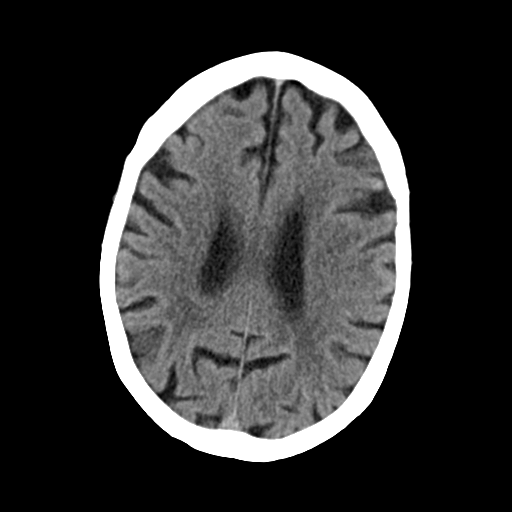
[im 18/32  bone]
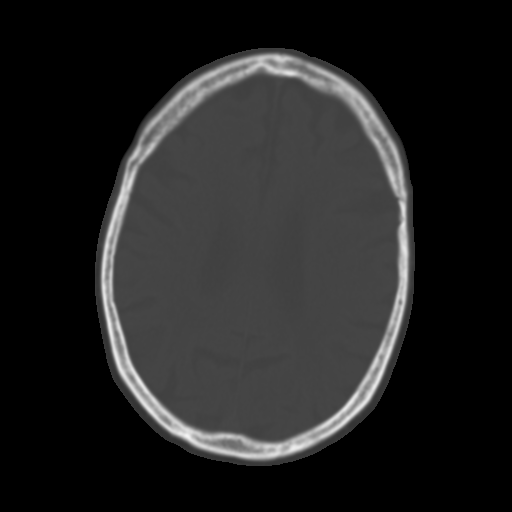
[im 20/32  brain]
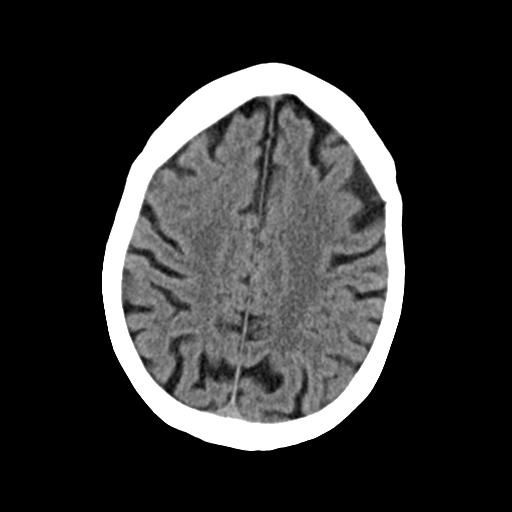
[im 22/32  brain]
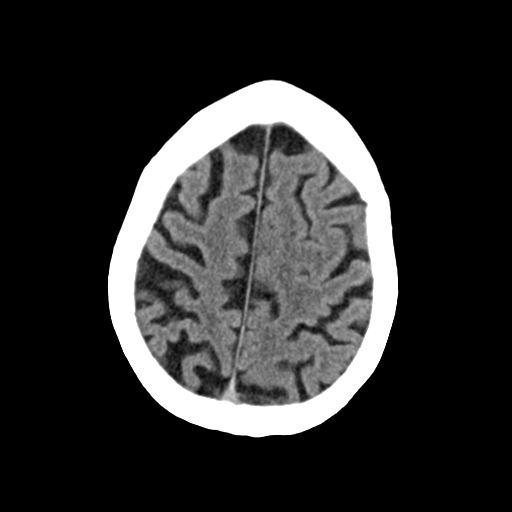
[im 24/32  brain]
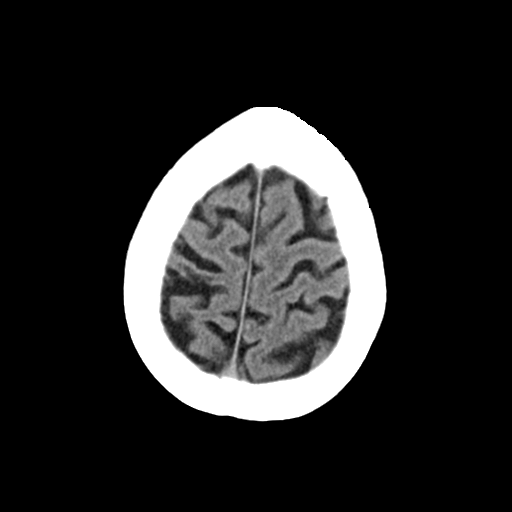
[im 26/32  brain]
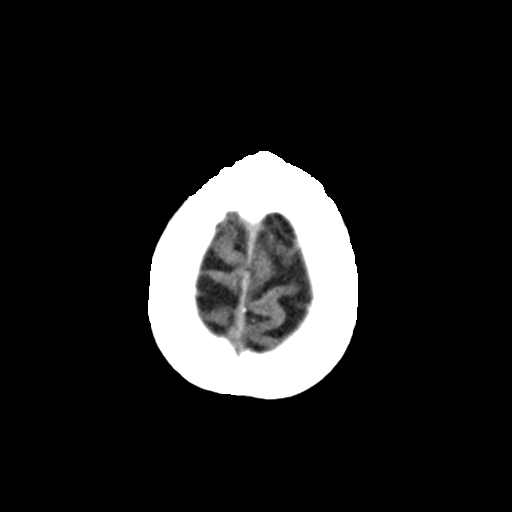
[im 26/32  bone]
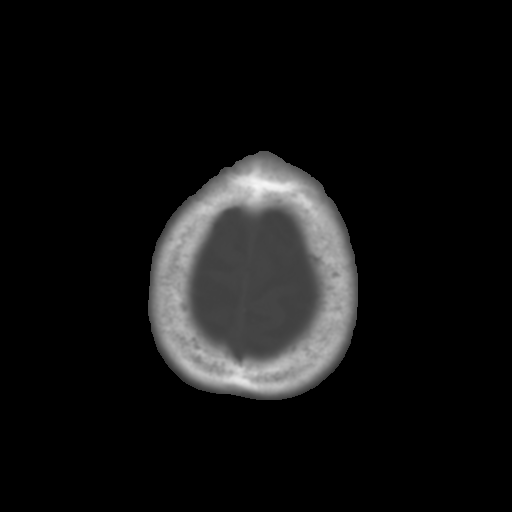
[im 28/32  brain]
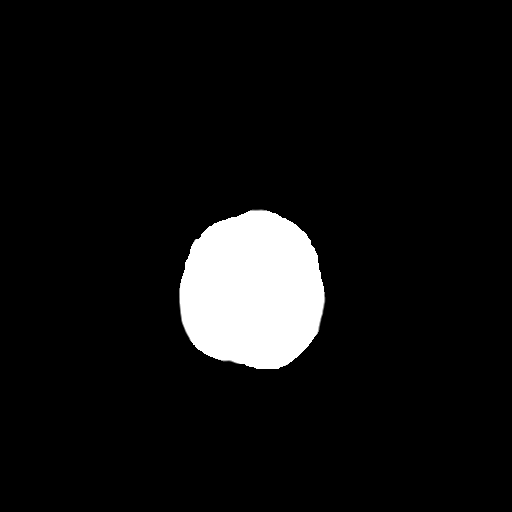
[im 30/32  brain]
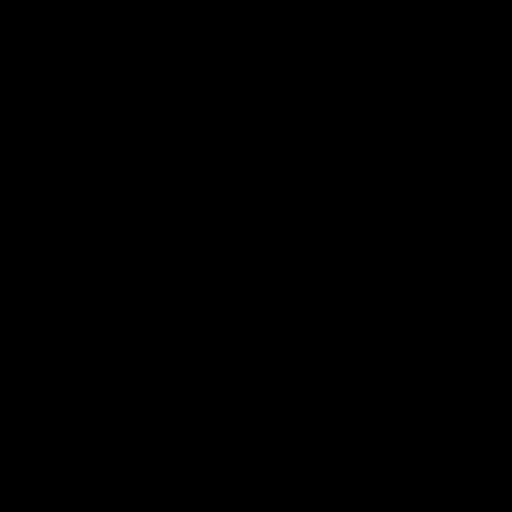

[15 of 30 positions shown; findings below may reference images not displayed]

FINDINGS: Brain:

Mild generalized parenchymal atrophy.

There is a small chronic cortically based infarct within the right
parietal lobe postcentral gyrus (series 2, image 24).

Mild ill-defined hypoattenuation within the cerebral white matter is
nonspecific, but consistent with chronic small vessel ischemic
disease.

There is no acute intracranial hemorrhage.

No demarcated cortical infarct is identified.

No extra-axial fluid collection.

No evidence of intracranial mass.

No midline shift.

Vascular: No hyperdense vessel.  Atherosclerotic calcifications.

Skull: Normal. Negative for fracture or focal lesion.

Sinuses/Orbits: Visualized orbits show no acute finding. No
significant paranasal sinus disease or mastoid effusion at the
imaged levels.
IMPRESSION: No evidence of acute intracranial abnormality.

Small chronic cortically infarct within the right parietal lobe
postcentral gyrus.

Mild generalized parenchymal atrophy and cerebral white matter
chronic small vessel ischemic disease.

## 2022-05-17 DIAGNOSIS — H353122 Nonexudative age-related macular degeneration, left eye, intermediate dry stage: Secondary | ICD-10-CM | POA: Diagnosis not present

## 2022-05-17 DIAGNOSIS — H43813 Vitreous degeneration, bilateral: Secondary | ICD-10-CM | POA: Diagnosis not present

## 2022-05-17 DIAGNOSIS — H35372 Puckering of macula, left eye: Secondary | ICD-10-CM | POA: Diagnosis not present

## 2022-05-17 DIAGNOSIS — E119 Type 2 diabetes mellitus without complications: Secondary | ICD-10-CM | POA: Diagnosis not present

## 2022-05-17 DIAGNOSIS — H353211 Exudative age-related macular degeneration, right eye, with active choroidal neovascularization: Secondary | ICD-10-CM | POA: Diagnosis not present

## 2022-05-17 DIAGNOSIS — H47012 Ischemic optic neuropathy, left eye: Secondary | ICD-10-CM | POA: Diagnosis not present

## 2022-05-20 DIAGNOSIS — M0609 Rheumatoid arthritis without rheumatoid factor, multiple sites: Secondary | ICD-10-CM | POA: Diagnosis not present

## 2022-05-20 DIAGNOSIS — Z79899 Other long term (current) drug therapy: Secondary | ICD-10-CM | POA: Diagnosis not present

## 2022-05-20 DIAGNOSIS — M316 Other giant cell arteritis: Secondary | ICD-10-CM | POA: Diagnosis not present

## 2022-06-07 ENCOUNTER — Ambulatory Visit: Payer: Medicare Other

## 2022-06-10 ENCOUNTER — Ambulatory Visit (INDEPENDENT_AMBULATORY_CARE_PROVIDER_SITE_OTHER): Payer: Medicare Other

## 2022-06-10 DIAGNOSIS — M810 Age-related osteoporosis without current pathological fracture: Secondary | ICD-10-CM | POA: Diagnosis not present

## 2022-06-10 MED ORDER — DENOSUMAB 60 MG/ML ~~LOC~~ SOSY
60.0000 mg | PREFILLED_SYRINGE | Freq: Once | SUBCUTANEOUS | Status: AC
  Administered 2022-06-10: 60 mg via SUBCUTANEOUS

## 2022-06-25 DIAGNOSIS — Z111 Encounter for screening for respiratory tuberculosis: Secondary | ICD-10-CM | POA: Diagnosis not present

## 2022-06-25 DIAGNOSIS — R5383 Other fatigue: Secondary | ICD-10-CM | POA: Diagnosis not present

## 2022-06-25 DIAGNOSIS — Z79899 Other long term (current) drug therapy: Secondary | ICD-10-CM | POA: Diagnosis not present

## 2022-06-25 DIAGNOSIS — M0609 Rheumatoid arthritis without rheumatoid factor, multiple sites: Secondary | ICD-10-CM | POA: Diagnosis not present

## 2022-06-25 DIAGNOSIS — M316 Other giant cell arteritis: Secondary | ICD-10-CM | POA: Diagnosis not present

## 2022-07-12 DIAGNOSIS — E119 Type 2 diabetes mellitus without complications: Secondary | ICD-10-CM | POA: Diagnosis not present

## 2022-07-12 DIAGNOSIS — H47012 Ischemic optic neuropathy, left eye: Secondary | ICD-10-CM | POA: Diagnosis not present

## 2022-07-12 DIAGNOSIS — H353122 Nonexudative age-related macular degeneration, left eye, intermediate dry stage: Secondary | ICD-10-CM | POA: Diagnosis not present

## 2022-07-12 DIAGNOSIS — H353211 Exudative age-related macular degeneration, right eye, with active choroidal neovascularization: Secondary | ICD-10-CM | POA: Diagnosis not present

## 2022-07-12 DIAGNOSIS — H43813 Vitreous degeneration, bilateral: Secondary | ICD-10-CM | POA: Diagnosis not present

## 2022-07-12 DIAGNOSIS — H35372 Puckering of macula, left eye: Secondary | ICD-10-CM | POA: Diagnosis not present

## 2022-07-21 ENCOUNTER — Other Ambulatory Visit: Payer: Self-pay | Admitting: Nurse Practitioner

## 2022-07-21 DIAGNOSIS — K219 Gastro-esophageal reflux disease without esophagitis: Secondary | ICD-10-CM

## 2022-07-23 DIAGNOSIS — M316 Other giant cell arteritis: Secondary | ICD-10-CM | POA: Diagnosis not present

## 2022-08-10 DIAGNOSIS — E119 Type 2 diabetes mellitus without complications: Secondary | ICD-10-CM | POA: Diagnosis not present

## 2022-08-10 DIAGNOSIS — Z961 Presence of intraocular lens: Secondary | ICD-10-CM | POA: Diagnosis not present

## 2022-08-10 DIAGNOSIS — H5201 Hypermetropia, right eye: Secondary | ICD-10-CM | POA: Diagnosis not present

## 2022-08-10 LAB — HM DIABETES EYE EXAM

## 2022-08-20 ENCOUNTER — Other Ambulatory Visit: Payer: Self-pay | Admitting: Cardiovascular Disease

## 2022-08-20 DIAGNOSIS — I739 Peripheral vascular disease, unspecified: Secondary | ICD-10-CM

## 2022-08-20 DIAGNOSIS — M316 Other giant cell arteritis: Secondary | ICD-10-CM | POA: Diagnosis not present

## 2022-08-20 DIAGNOSIS — Z79899 Other long term (current) drug therapy: Secondary | ICD-10-CM | POA: Diagnosis not present

## 2022-08-20 DIAGNOSIS — M0609 Rheumatoid arthritis without rheumatoid factor, multiple sites: Secondary | ICD-10-CM | POA: Diagnosis not present

## 2022-08-20 DIAGNOSIS — R5383 Other fatigue: Secondary | ICD-10-CM | POA: Diagnosis not present

## 2022-08-20 NOTE — Telephone Encounter (Signed)
Prescription refill request for Xarelto received.  Indication: PAD s/p aorto-bifemoral bypass surgery  Last office visit: 04/23/22 Allyson Sabal)  Weight: 49.1kg Age: 88 Scr:0.94 (03/10/22)  CrCl: 33.58ml/min  Appropriate dose. Refill sent.

## 2022-09-13 ENCOUNTER — Other Ambulatory Visit: Payer: Medicare Other

## 2022-09-13 DIAGNOSIS — R2681 Unsteadiness on feet: Secondary | ICD-10-CM | POA: Diagnosis not present

## 2022-09-13 DIAGNOSIS — E781 Pure hyperglyceridemia: Secondary | ICD-10-CM | POA: Diagnosis not present

## 2022-09-13 DIAGNOSIS — I10 Essential (primary) hypertension: Secondary | ICD-10-CM

## 2022-09-13 DIAGNOSIS — M810 Age-related osteoporosis without current pathological fracture: Secondary | ICD-10-CM | POA: Diagnosis not present

## 2022-09-13 DIAGNOSIS — N1831 Chronic kidney disease, stage 3a: Secondary | ICD-10-CM | POA: Diagnosis not present

## 2022-09-13 DIAGNOSIS — I25118 Atherosclerotic heart disease of native coronary artery with other forms of angina pectoris: Secondary | ICD-10-CM

## 2022-09-13 DIAGNOSIS — E119 Type 2 diabetes mellitus without complications: Secondary | ICD-10-CM | POA: Diagnosis not present

## 2022-09-13 DIAGNOSIS — R278 Other lack of coordination: Secondary | ICD-10-CM | POA: Diagnosis not present

## 2022-09-13 DIAGNOSIS — E0822 Diabetes mellitus due to underlying condition with diabetic chronic kidney disease: Secondary | ICD-10-CM | POA: Diagnosis not present

## 2022-09-13 DIAGNOSIS — M6281 Muscle weakness (generalized): Secondary | ICD-10-CM | POA: Diagnosis not present

## 2022-09-13 LAB — CBC WITH DIFFERENTIAL/PLATELET
Absolute Monocytes: 737 cells/uL (ref 200–950)
Basophils Absolute: 32 cells/uL (ref 0–200)
Basophils Relative: 0.5 %
Eosinophils Absolute: 151 cells/uL (ref 15–500)
Eosinophils Relative: 2.4 %
HCT: 38.4 % (ref 35.0–45.0)
Hemoglobin: 12.9 g/dL (ref 11.7–15.5)
Lymphs Abs: 1298 cells/uL (ref 850–3900)
MCH: 30.8 pg (ref 27.0–33.0)
MCHC: 33.6 g/dL (ref 32.0–36.0)
MCV: 91.6 fL (ref 80.0–100.0)
MPV: 11.4 fL (ref 7.5–12.5)
Monocytes Relative: 11.7 %
Neutro Abs: 4082 cells/uL (ref 1500–7800)
Neutrophils Relative %: 64.8 %
Platelets: 149 10*3/uL (ref 140–400)
RBC: 4.19 10*6/uL (ref 3.80–5.10)
RDW: 12.7 % (ref 11.0–15.0)
Total Lymphocyte: 20.6 %
WBC: 6.3 10*3/uL (ref 3.8–10.8)

## 2022-09-15 DIAGNOSIS — E119 Type 2 diabetes mellitus without complications: Secondary | ICD-10-CM | POA: Diagnosis not present

## 2022-09-15 DIAGNOSIS — H353122 Nonexudative age-related macular degeneration, left eye, intermediate dry stage: Secondary | ICD-10-CM | POA: Diagnosis not present

## 2022-09-15 DIAGNOSIS — R278 Other lack of coordination: Secondary | ICD-10-CM | POA: Diagnosis not present

## 2022-09-15 DIAGNOSIS — H47012 Ischemic optic neuropathy, left eye: Secondary | ICD-10-CM | POA: Diagnosis not present

## 2022-09-15 DIAGNOSIS — R2681 Unsteadiness on feet: Secondary | ICD-10-CM | POA: Diagnosis not present

## 2022-09-15 DIAGNOSIS — H353211 Exudative age-related macular degeneration, right eye, with active choroidal neovascularization: Secondary | ICD-10-CM | POA: Diagnosis not present

## 2022-09-15 DIAGNOSIS — M6281 Muscle weakness (generalized): Secondary | ICD-10-CM | POA: Diagnosis not present

## 2022-09-15 DIAGNOSIS — H43813 Vitreous degeneration, bilateral: Secondary | ICD-10-CM | POA: Diagnosis not present

## 2022-09-15 DIAGNOSIS — H35372 Puckering of macula, left eye: Secondary | ICD-10-CM | POA: Diagnosis not present

## 2022-09-17 ENCOUNTER — Ambulatory Visit (INDEPENDENT_AMBULATORY_CARE_PROVIDER_SITE_OTHER): Payer: Medicare Other | Admitting: Nurse Practitioner

## 2022-09-17 ENCOUNTER — Encounter: Payer: Self-pay | Admitting: Nurse Practitioner

## 2022-09-17 VITALS — BP 122/78 | HR 77 | Temp 97.6°F | Ht 61.0 in | Wt 111.2 lb

## 2022-09-17 DIAGNOSIS — N1831 Chronic kidney disease, stage 3a: Secondary | ICD-10-CM | POA: Diagnosis not present

## 2022-09-17 DIAGNOSIS — M316 Other giant cell arteritis: Secondary | ICD-10-CM | POA: Diagnosis not present

## 2022-09-17 DIAGNOSIS — Z95828 Presence of other vascular implants and grafts: Secondary | ICD-10-CM

## 2022-09-17 DIAGNOSIS — I10 Essential (primary) hypertension: Secondary | ICD-10-CM | POA: Diagnosis not present

## 2022-09-17 DIAGNOSIS — M810 Age-related osteoporosis without current pathological fracture: Secondary | ICD-10-CM

## 2022-09-17 DIAGNOSIS — E781 Pure hyperglyceridemia: Secondary | ICD-10-CM | POA: Diagnosis not present

## 2022-09-17 DIAGNOSIS — E0822 Diabetes mellitus due to underlying condition with diabetic chronic kidney disease: Secondary | ICD-10-CM | POA: Diagnosis not present

## 2022-09-17 MED ORDER — ROSUVASTATIN CALCIUM 10 MG PO TABS
10.0000 mg | ORAL_TABLET | Freq: Every day | ORAL | Status: DC
Start: 2022-09-17 — End: 2023-05-09

## 2022-09-17 NOTE — Patient Instructions (Addendum)
To move crestor to your evening medication.    Change proila appt to 11/6 and Make fasting lab appt same day and time

## 2022-09-17 NOTE — Progress Notes (Signed)
Careteam: Patient Care Team: Sharon Seller, NP as PCP - General (Geriatric Medicine) Runell Gess, MD as PCP - Cardiology (Cardiology) Sinda Du, MD as Consulting Physician (Ophthalmology) Duke, Roe Rutherford, PA as Physician Assistant (Cardiology)  PLACE OF SERVICE:  [facility] CLINIC  Advanced Directive information    Allergies  Allergen Reactions   Benadryl [Diphenhydramine]     Side effects per records received from [facility] Rheumatology    Lipitor [Atorvastatin]     rash   Tricor [Fenofibrate]     rash    Chief Complaint  Patient presents with   Medical Management of Chronic Issues    6 month follow-up. Discuss need for covid boosters and flu vaccine. Patient lives at [facility]      HPI: Patient is a 88 y.o. female for routine follow up  She has moved into [facility].  Reports food is good. She eats 2-3 meals a day.  Does a lot of activities. She has made some friends.   Reports she has been taking medication has prescribed, LDL and triglycerides elevated compared to previous labs .  DM- A1c at goal at 6.8, expect to be worse next time due to good foods at [facility].   Not drinking enough water- realizes she needs to drink more.   Continues to follow up with rheumatology for GCA and gets infusion monthly.  Review of Systems:  Review of Systems  Constitutional:  Negative for chills, fever and weight loss.  HENT:  Negative for tinnitus.   Respiratory:  Negative for cough, sputum production and shortness of breath.   Cardiovascular:  Negative for chest pain, palpitations and leg swelling.  Gastrointestinal:  Negative for abdominal pain, constipation, diarrhea and heartburn.  Genitourinary:  Negative for dysuria, frequency and urgency.  Musculoskeletal:  Negative for back pain, falls, joint pain and myalgias.  Skin: Negative.   Neurological:  Negative for dizziness and headaches.  Psychiatric/Behavioral:  Negative for depression  and memory loss. The patient does not have insomnia.     Past Medical History:  Diagnosis Date   Atherosclerosis of aorta [facility] [place])    s/p AO bypass 2004, Per records received from [facility]    Bladder cancer [facility]) 02/10/2010   CAD (coronary artery disease)    Coronary intervention [facility] in 2000.   Diabetes mellitus without complication (HCC)    Former tobacco use    Per records received from [facility] Rheumatology    GCA (giant cell arteritis) (HCC)    Giant cell arteritis (HCC)    Per records received from [facility] Rheumatology    Hyperlipidemia    Hypertension    Macular degeneration    Osteoporosis    Prolia injection administered on 05/09/2019, Per records received from [facility]    PAD (peripheral artery disease) [facility])    s/p bilateral iliac-femoral artery bypass 2000, Per records received from [facility] Rheumatology    Peripheral vascular disease of lower extremity (HCC)    aortobifemoral bypass grafting at [facility] in [place] in 2004.    Presence of stent in left circumflex coronary artery    Rheumatoid arthritis [facility])    Per records received from [facility] Rheumatology    Past Surgical History:  Procedure Laterality Date   AORTO-FEMORAL BYPASS GRAFT     aortobifemoral bypass grafting at [facility] in [place] in 2004.    CATARACT EXTRACTION     HERNIA REPAIR     KIDNEY SURGERY  04/06/2011   URETER SURGERY  05/05/2011  Social History:   reports that she quit smoking about 11 years ago. Her smoking use included cigarettes. She started smoking about 75 years ago. She has a 64 pack-year smoking history. She has never used smokeless tobacco. She reports that she does not currently use alcohol. She reports that she does not use drugs.  Family History  Problem Relation Age of Onset   Stroke Mother    Hypertension Mother        Per records received from [facility] Rheumatology    Lung cancer Father    Heart attack Brother     Hyperlipidemia Brother    Hypertension Brother    Diabetes Maternal Uncle    Diabetes Maternal Grandmother    Hypertension Son        Per records received from [facility]    Medications: Patient's Medications  New Prescriptions   No medications on file  Previous Medications   B COMPLEX VITAMINS TABLET    Take 1 tablet by mouth daily.   CALCIUM CARBONATE (CALCIUM 500 PO)    Take 1 tablet by mouth in the morning and at bedtime. Only 2 weeks prior to Prolia injection and 2 weeks after   CHOLECALCIFEROL (VITAMIN D3) 50 MCG (2000 UT) TABS    Take 1 tablet by mouth every other day.   DENOSUMAB (PROLIA) 60 MG/ML SOSY INJECTION    Inject 60 mg into the skin every 6 (six) months.   LOSARTAN (COZAAR) 100 MG TABLET    Take 1 tablet (100 mg total) by mouth daily.   METFORMIN (GLUCOPHAGE) 500 MG TABLET    TAKE 2 TABLETS BY MOUTH IN THE MORNING AND 1 TABLET WITH EVENING MEAL   METOPROLOL TARTRATE (LOPRESSOR) 25 MG TABLET    TAKE 1 TABLET BY MOUTH IN THE MORNING AND 1/2 TABLET IN THE EVENING   MIRTAZAPINE (REMERON) 15 MG TABLET    TAKE 2 TABLETS (30 MG TOTAL) BY MOUTH AT BEDTIME.   MULTIPLE VITAMINS-MINERALS (ICAPS AREDS 2) CAPS    Take 1 capsule by mouth 2 (two) times daily. Every morning and every evening   MULTIPLE VITAMINS-MINERALS (WOMENS MULTI GUMMIES PO)    Take 1 tablet by mouth in the morning and at bedtime.   OMEPRAZOLE (PRILOSEC) 20 MG CAPSULE    TAKE 1 CAPSULE BY MOUTH DAILY.   ONETOUCH ULTRA TEST STRIP    USE 1 STRIP TO CHECK GLUCOSE ONCE DAILY   ROSUVASTATIN (CRESTOR) 10 MG TABLET    Take 1 tablet (10 mg total) by mouth daily.   TOCILIZUMAB (ACTEMRA IV)    Inject into the vein every 30 (thirty) days. Administered by rheumatologist for autoimmune disease   XARELTO 2.5 MG TABS TABLET    TAKE 1 TABLET BY MOUTH 2 TIMES DAILY.  Modified Medications   No medications on file  Discontinued Medications   ICOSAPENT ETHYL (VASCEPA) 1 G CAPSULE    Take 2 capsules (2 g total) by mouth 2  (two) times daily.    Physical Exam:  Vitals:   09/17/22 1105  BP: 122/78  Pulse: 77  Temp: 97.6 F (36.4 C)  TempSrc: Temporal  SpO2: 99%  Weight: 111 lb 3.2 oz (50.4 kg)  Height: 5\' 1"  (1.549 m)   Body mass index is 21.01 kg/m. Wt Readings from Last 3 Encounters:  09/17/22 111 lb 3.2 oz (50.4 kg)  04/23/22 108 lb 3.2 oz (49.1 kg)  03/15/22 111 lb 6.4 oz (50.5 kg)    Physical Exam Constitutional:  General: She is not in acute distress.    Appearance: She is well-developed. She is not diaphoretic.  HENT:     Head: Normocephalic and atraumatic.     Mouth/Throat:     Pharynx: No oropharyngeal exudate.  Eyes:     Conjunctiva/sclera: Conjunctivae normal.     Pupils: Pupils are equal, round, and reactive to light.  Cardiovascular:     Rate and Rhythm: Normal rate and regular rhythm.     Heart sounds: Normal heart sounds.  Pulmonary:     Effort: Pulmonary effort is normal.     Breath sounds: Normal breath sounds.  Abdominal:     General: Bowel sounds are normal.     Palpations: Abdomen is soft.  Musculoskeletal:        General: No tenderness.     Cervical back: Normal range of motion and neck supple.     Right lower leg: No edema.     Left lower leg: No edema.  Skin:    General: Skin is warm and dry.  Neurological:     Mental Status: She is alert and oriented to person, place, and time.  Psychiatric:        Mood and Affect: Mood normal.     Labs reviewed: Basic Metabolic Panel: Recent Labs    03/10/22 0913 09/13/22 0928  NA 144 143  K 4.9 4.7  CL 108 107  CO2 28 27  GLUCOSE 123* 115*  BUN 25 22  CREATININE 0.94 1.10*  CALCIUM 9.6 9.5   Liver Function Tests: Recent Labs    03/10/22 0913 09/13/22 0928  AST 13 20  ALT 12 24  BILITOT 0.4 0.5  PROT 6.2 6.4   No results for input(s): "LIPASE", "AMYLASE" in the last 8760 hours. No results for input(s): "AMMONIA" in the last 8760 hours. CBC: Recent Labs    03/10/22 0913 09/13/22 0928   WBC 5.5 6.3  NEUTROABS 3,185 4,082  HGB 13.3 12.9  HCT 38.1 38.4  MCV 91.4 91.6  PLT 157 149   Lipid Panel: Recent Labs    03/10/22 0913 09/13/22 0928  CHOL 140 189  HDL 38* 44*  LDLCALC 69 105*  TRIG 249* 300*  CHOLHDL 3.7 4.3   TSH: No results for input(s): "TSH" in the last 8760 hours. A1C: Lab Results  Component Value Date   HGBA1C 6.8 (H) 09/13/2022     Assessment/Plan 1. Age-related osteoporosis without current pathological fracture -Recommended to take calcium 600 mg twice daily with Vitamin D 2000 units daily and weight bearing activity 30 mins/5 days a week -continues on prolia every 6 months   2. Diabetes mellitus due to underlying condition with stage 3a chronic kidney disease, without long-term current use of insulin (HCC) -Encouraged dietary compliance, routine foot care/monitoring and to keep up with diabetic eye exams through ophthalmology  - COMPLETE METABOLIC PANEL WITH GFR; Future - Hemoglobin A1c; Future  3. Hypertriglyceridemia -will have her take crestor at bedtime - rosuvastatin (CRESTOR) 10 MG tablet; Take 1 tablet (10 mg total) by mouth at bedtime. - Lipid panel; Future - COMPLETE METABOLIC PANEL WITH GFR; Future  4. GCA (giant cell arteritis) (HCC) Followed by rheumatology with monthly infusions  5. Essential hypertension -Blood pressure well controlled, goal bp <140/90 Continue current medications and dietary modifications follow metabolic panel  6. S/P aorto-bifemoral bypass surgery -followed by cardiology, continues on xarelto monthly  Return in about 3 months (around 12/18/2022) for routine follow up, labs before . Janene Harvey. Janyth Contes,  [facility] & Adult Medicine [phone]

## 2022-09-20 DIAGNOSIS — M316 Other giant cell arteritis: Secondary | ICD-10-CM | POA: Diagnosis not present

## 2022-09-20 DIAGNOSIS — M0609 Rheumatoid arthritis without rheumatoid factor, multiple sites: Secondary | ICD-10-CM | POA: Diagnosis not present

## 2022-10-05 DIAGNOSIS — M810 Age-related osteoporosis without current pathological fracture: Secondary | ICD-10-CM | POA: Diagnosis not present

## 2022-10-05 DIAGNOSIS — Z79899 Other long term (current) drug therapy: Secondary | ICD-10-CM | POA: Diagnosis not present

## 2022-10-05 DIAGNOSIS — M316 Other giant cell arteritis: Secondary | ICD-10-CM | POA: Diagnosis not present

## 2022-10-05 DIAGNOSIS — D508 Other iron deficiency anemias: Secondary | ICD-10-CM | POA: Diagnosis not present

## 2022-10-05 DIAGNOSIS — M0609 Rheumatoid arthritis without rheumatoid factor, multiple sites: Secondary | ICD-10-CM | POA: Diagnosis not present

## 2022-10-05 DIAGNOSIS — R5382 Chronic fatigue, unspecified: Secondary | ICD-10-CM | POA: Diagnosis not present

## 2022-10-05 DIAGNOSIS — Z6820 Body mass index (BMI) 20.0-20.9, adult: Secondary | ICD-10-CM | POA: Diagnosis not present

## 2022-10-18 DIAGNOSIS — M316 Other giant cell arteritis: Secondary | ICD-10-CM | POA: Diagnosis not present

## 2022-10-18 DIAGNOSIS — M0609 Rheumatoid arthritis without rheumatoid factor, multiple sites: Secondary | ICD-10-CM | POA: Diagnosis not present

## 2022-11-12 ENCOUNTER — Other Ambulatory Visit: Payer: Self-pay | Admitting: Nurse Practitioner

## 2022-11-12 DIAGNOSIS — E0865 Diabetes mellitus due to underlying condition with hyperglycemia: Secondary | ICD-10-CM

## 2022-11-15 DIAGNOSIS — M316 Other giant cell arteritis: Secondary | ICD-10-CM | POA: Diagnosis not present

## 2022-11-15 DIAGNOSIS — Z79899 Other long term (current) drug therapy: Secondary | ICD-10-CM | POA: Diagnosis not present

## 2022-11-15 DIAGNOSIS — R5383 Other fatigue: Secondary | ICD-10-CM | POA: Diagnosis not present

## 2022-11-22 DIAGNOSIS — F331 Major depressive disorder, recurrent, moderate: Secondary | ICD-10-CM | POA: Diagnosis not present

## 2022-11-22 DIAGNOSIS — G479 Sleep disorder, unspecified: Secondary | ICD-10-CM | POA: Diagnosis not present

## 2022-11-25 DIAGNOSIS — H353211 Exudative age-related macular degeneration, right eye, with active choroidal neovascularization: Secondary | ICD-10-CM | POA: Diagnosis not present

## 2022-11-25 DIAGNOSIS — E119 Type 2 diabetes mellitus without complications: Secondary | ICD-10-CM | POA: Diagnosis not present

## 2022-11-25 DIAGNOSIS — H35372 Puckering of macula, left eye: Secondary | ICD-10-CM | POA: Diagnosis not present

## 2022-11-25 DIAGNOSIS — H353122 Nonexudative age-related macular degeneration, left eye, intermediate dry stage: Secondary | ICD-10-CM | POA: Diagnosis not present

## 2022-11-25 DIAGNOSIS — H47012 Ischemic optic neuropathy, left eye: Secondary | ICD-10-CM | POA: Diagnosis not present

## 2022-11-25 DIAGNOSIS — H43813 Vitreous degeneration, bilateral: Secondary | ICD-10-CM | POA: Diagnosis not present

## 2022-11-29 DIAGNOSIS — F3340 Major depressive disorder, recurrent, in remission, unspecified: Secondary | ICD-10-CM | POA: Diagnosis not present

## 2022-12-09 ENCOUNTER — Other Ambulatory Visit: Payer: Self-pay | Admitting: Nurse Practitioner

## 2022-12-09 DIAGNOSIS — M810 Age-related osteoporosis without current pathological fracture: Secondary | ICD-10-CM

## 2022-12-09 MED ORDER — DENOSUMAB 60 MG/ML ~~LOC~~ SOSY: 60.0000 mg | PREFILLED_SYRINGE | Freq: Once | SUBCUTANEOUS | Status: AC

## 2022-12-14 ENCOUNTER — Ambulatory Visit: Payer: Medicare Other

## 2022-12-15 ENCOUNTER — Other Ambulatory Visit: Payer: Medicare Other

## 2022-12-16 ENCOUNTER — Other Ambulatory Visit: Payer: Medicare Other

## 2022-12-16 ENCOUNTER — Ambulatory Visit: Payer: Medicare Other

## 2022-12-16 DIAGNOSIS — E0822 Diabetes mellitus due to underlying condition with diabetic chronic kidney disease: Secondary | ICD-10-CM | POA: Diagnosis not present

## 2022-12-16 DIAGNOSIS — E781 Pure hyperglyceridemia: Secondary | ICD-10-CM | POA: Diagnosis not present

## 2022-12-16 DIAGNOSIS — N1831 Chronic kidney disease, stage 3a: Secondary | ICD-10-CM | POA: Diagnosis not present

## 2022-12-16 DIAGNOSIS — M810 Age-related osteoporosis without current pathological fracture: Secondary | ICD-10-CM

## 2022-12-16 MED ORDER — DENOSUMAB 60 MG/ML ~~LOC~~ SOSY
60.0000 mg | PREFILLED_SYRINGE | Freq: Once | SUBCUTANEOUS | Status: AC
Start: 2022-12-16 — End: 2022-12-16
  Administered 2022-12-16: 60 mg via SUBCUTANEOUS

## 2022-12-16 MED ORDER — DENOSUMAB 60 MG/ML ~~LOC~~ SOSY
60.0000 mg | PREFILLED_SYRINGE | Freq: Once | SUBCUTANEOUS | Status: AC
Start: 2023-06-17 — End: 2023-07-18
  Administered 2023-07-18: 60 mg via SUBCUTANEOUS

## 2022-12-17 ENCOUNTER — Ambulatory Visit: Payer: Medicare Other | Admitting: Nurse Practitioner

## 2022-12-17 DIAGNOSIS — M316 Other giant cell arteritis: Secondary | ICD-10-CM | POA: Diagnosis not present

## 2022-12-17 DIAGNOSIS — M0609 Rheumatoid arthritis without rheumatoid factor, multiple sites: Secondary | ICD-10-CM | POA: Diagnosis not present

## 2022-12-17 LAB — COMPLETE METABOLIC PANEL WITH GFR
AG Ratio: 2.6 (calc) — ABNORMAL HIGH (ref 1.0–2.5)
ALT: 18 U/L (ref 6–29)
AST: 16 U/L (ref 10–35)
Albumin: 4.7 g/dL (ref 3.6–5.1)
Alkaline phosphatase (APISO): 43 U/L (ref 37–153)
BUN/Creatinine Ratio: 24 (calc) — ABNORMAL HIGH (ref 6–22)
BUN: 26 mg/dL — ABNORMAL HIGH (ref 7–25)
CO2: 26 mmol/L (ref 20–32)
Calcium: 9.7 mg/dL (ref 8.6–10.4)
Chloride: 106 mmol/L (ref 98–110)
Creat: 1.09 mg/dL — ABNORMAL HIGH (ref 0.60–0.95)
Globulin: 1.8 g/dL — ABNORMAL LOW (ref 1.9–3.7)
Glucose, Bld: 141 mg/dL — ABNORMAL HIGH (ref 65–99)
Potassium: 4.5 mmol/L (ref 3.5–5.3)
Sodium: 144 mmol/L (ref 135–146)
Total Bilirubin: 0.7 mg/dL (ref 0.2–1.2)
Total Protein: 6.5 g/dL (ref 6.1–8.1)
eGFR: 49 mL/min/{1.73_m2} — ABNORMAL LOW (ref >=60)

## 2022-12-17 LAB — LIPID PANEL
Cholesterol: 188 mg/dL (ref <200)
HDL: 51 mg/dL (ref >=50)
LDL Cholesterol (Calc): 97 mg/dL
Non-HDL Cholesterol (Calc): 137 mg/dL — ABNORMAL HIGH (ref <130)
Total CHOL/HDL Ratio: 3.7 (calc) (ref <5.0)
Triglycerides: 298 mg/dL — ABNORMAL HIGH (ref <150)

## 2022-12-17 LAB — HEMOGLOBIN A1C
Hgb A1c MFr Bld: 6.9 %{Hb} — ABNORMAL HIGH (ref <5.7)
Mean Plasma Glucose: 151 mg/dL
eAG (mmol/L): 8.4 mmol/L

## 2022-12-20 ENCOUNTER — Ambulatory Visit: Payer: Medicare Other | Admitting: Nurse Practitioner

## 2022-12-22 DIAGNOSIS — G4700 Insomnia, unspecified: Secondary | ICD-10-CM | POA: Diagnosis not present

## 2022-12-22 DIAGNOSIS — F331 Major depressive disorder, recurrent, moderate: Secondary | ICD-10-CM | POA: Diagnosis not present

## 2022-12-28 ENCOUNTER — Other Ambulatory Visit: Payer: Self-pay | Admitting: Nurse Practitioner

## 2022-12-28 DIAGNOSIS — E0865 Diabetes mellitus due to underlying condition with hyperglycemia: Secondary | ICD-10-CM

## 2023-01-14 ENCOUNTER — Encounter: Payer: Self-pay | Admitting: Nurse Practitioner

## 2023-01-14 ENCOUNTER — Ambulatory Visit (INDEPENDENT_AMBULATORY_CARE_PROVIDER_SITE_OTHER): Payer: Medicare Other | Admitting: Nurse Practitioner

## 2023-01-14 VITALS — BP 138/80 | HR 71 | Temp 97.5°F | Ht 61.0 in | Wt 111.0 lb

## 2023-01-14 DIAGNOSIS — E0822 Diabetes mellitus due to underlying condition with diabetic chronic kidney disease: Secondary | ICD-10-CM

## 2023-01-14 DIAGNOSIS — M810 Age-related osteoporosis without current pathological fracture: Secondary | ICD-10-CM

## 2023-01-14 DIAGNOSIS — Z95828 Presence of other vascular implants and grafts: Secondary | ICD-10-CM

## 2023-01-14 DIAGNOSIS — N1831 Chronic kidney disease, stage 3a: Secondary | ICD-10-CM

## 2023-01-14 DIAGNOSIS — E781 Pure hyperglyceridemia: Secondary | ICD-10-CM | POA: Diagnosis not present

## 2023-01-14 DIAGNOSIS — I10 Essential (primary) hypertension: Secondary | ICD-10-CM

## 2023-01-14 DIAGNOSIS — M316 Other giant cell arteritis: Secondary | ICD-10-CM

## 2023-01-14 DIAGNOSIS — G4700 Insomnia, unspecified: Secondary | ICD-10-CM

## 2023-01-14 NOTE — Progress Notes (Signed)
Careteam: Patient Care Team: Sharon Seller, NP as PCP - General (Geriatric Medicine) Runell Gess, MD as PCP - Cardiology (Cardiology) Sinda Du, MD as Consulting Physician (Ophthalmology) Duke, Roe Rutherford, PA as Physician Assistant (Cardiology)  PLACE OF SERVICE:  [facility] CL[facility]  Advanced Directive information Does Patient Have a Medical Advance Directive?: Yes, Type of Advance Directive: Living will, Does patient want to make changes to medical advance directive?: No - Patient declined  Allergies  Allergen Reactions   Benadryl [Diphenhydramine]     Side effects per records received from [facility] Rheumatology    Lipitor [Atorvastatin]     rash   Tricor [Fenofibrate]     rash    Chief Complaint  Patient presents with   Medical Management of Chronic Issues    3 month follow-up. Discuss need for covid booster. Discuss Mirtazapine dosing, patient states she only takes 1, however the [facility] from [facility] states 2 by mouth daily.      HPI: Patient is a 88 y.o. female for routine follow up.   Dessert are good at facility, A1c at 6.9.   Triglycerides chronically elevated.   Osteoporosis- had prolia with her blood work.  Continues to follow up with rheumatology   Hyperlipidemia- on crestor 10 mg daily  Weight has been stable.   [facility].   Review of Systems:  Review of Systems  Constitutional:  Negative for chills, fever and weight loss.  HENT:  Negative for tinnitus.   Respiratory:  Negative for cough, sputum production and shortness of breath.   Cardiovascular:  Negative for chest pain, palpitations and leg swelling.  Gastrointestinal:  Negative for abdominal pain, constipation, diarrhea and heartburn.  Genitourinary:  Negative for dysuria, frequency and urgency.  Musculoskeletal:  Negative for back pain, falls, joint pain and myalgias.  Skin: Negative.   Neurological:  Negative for dizziness and headaches.  Psychiatric/Behavioral:  Negative  for depression and memory loss. The patient does not have insomnia.     Past Medical History:  Diagnosis Date   Atherosclerosis of aorta [facility])    s/p AO bypass 2004, Per records received from [facility] Rhe[facility]    Bladder cancer [facility]) 02/10/2010   CAD (coronary artery disease)    Coronary intervention [facility] in 2000.   Diabetes mellitus without complication (HCC)    Former tobacco use    Per records received from [facility] Rheumatology    GCA (giant cell arteritis) (HCC)    Giant cell arteritis (HCC)    Per records received from [facility] Rheumatology    Hyperlipidemia    Hypertension    Macular degeneration    Osteoporosis    Prolia injection administered on 05/09/2019, Per records received from [facility] Rheumatology    PAD (peripheral artery disease) [facility])    s/p bilateral iliac-femoral artery bypass 2000, Per records received from [facility] Rheumatology    Peripheral vascular disease of lower extremity (HCC)    aortobifemoral bypass grafting at [facility] in [place] in 2004.    Presence of stent in left circumflex coronary artery    Rheumatoid arthritis [facility])    Per records received from [facility] Rheumatology    Past Surgical History:  Procedure Laterality Date   AORTO-FEMORAL BYPASS GRAFT     aortobifemoral bypass grafting at [facility] in [place] in 2004.    CATARACT EXTRACTION     HERNIA REPAIR     KIDNEY SURGERY  04/06/2011   URETER SURGERY  05/05/2011   Social History:   reports that  she quit smoking about 11 years ago. Her smoking use included cigarettes. She started smoking about 75 years ago. She has a 64 pack-year smoking history. She has never used smokeless tobacco. She reports that she does not currently use alcohol. She reports that she does not use drugs.  Family History  Problem Relation Age of Onset   Stroke Mother    Hypertension Mother        Per records received from [facility] Rheumatology    Lung cancer Father    Heart attack  Brother    Hyperlipidemia Brother    Hypertension Brother    Diabetes Maternal Uncle    Diabetes Maternal Grandmother    Hypertension Son        Per records received from [place] [facility]umatology    Medications: Patient's Medications  New Prescriptions   No medications on file  Previous Medications   ACETAMINOPHEN (TYLENOL) 325 MG TABLET    Take 650 mg by mouth every 6 (six) hours as needed.   B COMPLEX VITAMINS TABLET    Take 1 tablet by mouth daily.   CALCIUM CARBONATE (CALCIUM 500 PO)    Take 1 tablet by mouth in the morning and at bedtime. Only 2 weeks prior to Prolia injection and 2 weeks after   CHOLECALCIFEROL (VITAMIN D3) 50 MCG (2000 UT) TABS    Take 1 tablet by mouth every other day.   DENOSUMAB (PROLIA) 60 MG/ML SOSY INJECTION    Inject 60 mg into the skin every 6 (six) months.   LOSARTAN (COZAAR) 100 MG TABLET    Take 1 tablet (100 mg total) by mouth daily.   METFORMIN (GLUCOPHAGE) 500 MG TABLET    TAKE 2 TABLETS BY MOUTH IN THE MORNING AND 1 TABLET WITH EVENING MEAL   METOPROLOL TARTRATE (LOPRESSOR) 25 MG TABLET    TAKE 1 TABLET BY MOUTH IN THE MORNING AND 1/2 TABLET IN THE EVENING   MIRTAZAPINE (REMERON) 15 MG TABLET    TAKE 2 TABLETS (30 MG TOTAL) BY MOUTH AT BEDTIME.   MULTIPLE VITAMINS-MINERALS (ICAPS AREDS 2) CAPS    Take 1 capsule by mouth 2 (two) times daily. Every morning and every evening   MULTIPLE VITAMINS-MINERALS (WOMENS MULTI GUMMIES PO)    Take 1 tablet by mouth in the morning and at bedtime.   OMEPRAZOLE (PRILOSEC) 20 MG CAPSULE    TAKE 1 CAPSULE BY MOUTH DAILY.   ONETOUCH ULTRA TEST TEST STRIP    USE 1 STRIP TO CHECK GLUCOSE ONCE DAILY   ROSUVASTATIN (CRESTOR) 10 MG TABLET    Take 1 tablet (10 mg total) by mouth at bedtime.   TOCILIZUMAB (ACTEMRA IV)    Inject into the vein every 30 (thirty) days. Administered by rheumatologist for autoimmune disease   XARELTO 2.5 MG TABS TABLET    TAKE 1 TABLET BY MOUTH 2 TIMES DAILY.  Modified Medications   No medications  on file  Discontinued Medications   No medications on file    Physical Exam:  Vitals:   01/14/23 1033  BP: 138/80  Pulse: 71  Temp: (!) 97.5 F (36.4 C)  TempSrc: Temporal  SpO2: 98%  Weight: 111 lb (50.3 kg)  Height: 5\' 1"  (1.549 m)   Body mass index is 20.97 kg/m. Wt Readings from Last 3 Encounters:  01/14/23 111 lb (50.3 kg)  09/17/22 111 lb 3.2 oz (50.4 kg)  04/23/22 108 lb 3.2 oz (49.1 kg)    Physical Exam Constitutional:      General: She  is not in acute distress.    Appearance: She is well-developed. She is not diaphoretic.  HENT:     Head: Normocephalic and atraumatic.     Mouth/Throat:     Pharynx: No oropharyngeal exudate.  Eyes:     Conjunctiva/sclera: Conjunctivae normal.     Pupils: Pupils are equal, round, and reactive to light.  Cardiovascular:     Rate and Rhythm: Normal rate and regular rhythm.     Heart sounds: Normal heart sounds.  Pulmonary:     Effort: Pulmonary effort is normal.     Breath sounds: Normal breath sounds.  Abdominal:     General: Bowel sounds are normal.     Palpations: Abdomen is soft.  Musculoskeletal:     Cervical back: Normal range of motion and neck supple.     Right lower leg: No edema.     Left lower leg: No edema.  Skin:    General: Skin is warm and dry.  Neurological:     Mental Status: She is alert.  Psychiatric:        Mood and Affect: Mood normal.     Labs reviewed: Basic Metabolic Panel: Recent Labs    03/10/22 0913 09/13/22 0928 12/16/22 0900  NA 144 143 144  K 4.9 4.7 4.5  CL 108 107 106  CO2 28 27 26   GLUCOSE 123* 115* 141*  BUN 25 22 26*  CREATININE 0.94 1.10* 1.09*  CALCIUM 9.6 9.5 9.7   Liver Function Tests: Recent Labs    03/10/22 0913 09/13/22 0928 12/16/22 0900  AST 13 20 16   ALT 12 24 18   BILITOT 0.4 0.5 0.7  PROT 6.2 6.4 6.5   No results for input(s): "LIPASE", "AMYLASE" in the last 8760 hours. No results for input(s): "AMMONIA" in the last 8760 hours. CBC: Recent Labs     03/10/22 0913 09/13/22 0928  WBC 5.5 6.3  NEUTROABS 3,185 4,082  HGB 13.3 12.9  HCT 38.1 38.4  MCV 91.4 91.6  PLT 157 149   Lipid Panel: Recent Labs    03/10/22 0913 09/13/22 0928 12/16/22 0900  CHOL 140 189 188  HDL 38* 44* 51  LDLCALC 69 105* 97  TRIG 249* 300* 298*  CHOLHDL 3.7 4.3 3.7   TSH: No results for input(s): "TSH" in the last 8760 hours. A1C: Lab Results  Component Value Date   HGBA1C 6.9 (H) 12/16/2022     Assessment/Plan 1. Age-related osteoporosis without current pathological fracture -continues on prolia every 6 months with cal and vit d supplement Weight bearing exercises as tolerates  2. Diabetes mellitus due to underlying condition with stage 3a chronic kidney disease, without long-term current use of insulin (HCC) A1c stable, Encouraged dietary compliance, routine foot care/monitoring and to keep up with diabetic eye exams through ophthalmology  -continues on metformin  3. Hypertriglyceridemia -stable, continues on crestor 10 mg daily Ideally LDL goal <70, she was not getting medication consistently when she first moved to [facility] and LDL was elevated, has improved slightly but not at goal at this time, will recheck in 6 months and if contiues to be over goal consider increase in statin  4. GCA (giant cell arteritis) (HCC) Stable, followed by rheumatology and on tocilizumab monthly infusion   5. Essential hypertension -Blood pressure well controlled, goal bp <140/90 Continue current medications and dietary modifications follow metabolic panel  6. Insomnia, unspecified type -stable, continues on remeron  7. S/P aorto-bifemoral bypass surgery -followed by cardiology, continues on xarelto  Return in about 6 months (around 07/15/2023) for routine follow up, labs prior .  Janene Harvey. [facility] & Adult Medicine [phone]

## 2023-01-17 DIAGNOSIS — F331 Major depressive disorder, recurrent, moderate: Secondary | ICD-10-CM | POA: Diagnosis not present

## 2023-01-17 DIAGNOSIS — G4700 Insomnia, unspecified: Secondary | ICD-10-CM | POA: Diagnosis not present

## 2023-01-25 ENCOUNTER — Telehealth: Payer: Medicare Other | Admitting: Nurse Practitioner

## 2023-02-14 DIAGNOSIS — F331 Major depressive disorder, recurrent, moderate: Secondary | ICD-10-CM | POA: Diagnosis not present

## 2023-02-14 DIAGNOSIS — G4700 Insomnia, unspecified: Secondary | ICD-10-CM | POA: Diagnosis not present

## 2023-02-15 DIAGNOSIS — M316 Other giant cell arteritis: Secondary | ICD-10-CM | POA: Diagnosis not present

## 2023-02-15 DIAGNOSIS — Z79899 Other long term (current) drug therapy: Secondary | ICD-10-CM | POA: Diagnosis not present

## 2023-02-15 DIAGNOSIS — R5383 Other fatigue: Secondary | ICD-10-CM | POA: Diagnosis not present

## 2023-02-17 DIAGNOSIS — H35372 Puckering of macula, left eye: Secondary | ICD-10-CM | POA: Diagnosis not present

## 2023-02-17 DIAGNOSIS — H353211 Exudative age-related macular degeneration, right eye, with active choroidal neovascularization: Secondary | ICD-10-CM | POA: Diagnosis not present

## 2023-02-17 DIAGNOSIS — H43813 Vitreous degeneration, bilateral: Secondary | ICD-10-CM | POA: Diagnosis not present

## 2023-02-17 DIAGNOSIS — H47012 Ischemic optic neuropathy, left eye: Secondary | ICD-10-CM | POA: Diagnosis not present

## 2023-02-17 DIAGNOSIS — H353122 Nonexudative age-related macular degeneration, left eye, intermediate dry stage: Secondary | ICD-10-CM | POA: Diagnosis not present

## 2023-02-17 DIAGNOSIS — E119 Type 2 diabetes mellitus without complications: Secondary | ICD-10-CM | POA: Diagnosis not present

## 2023-02-25 ENCOUNTER — Other Ambulatory Visit: Payer: Self-pay | Admitting: Nurse Practitioner

## 2023-02-25 DIAGNOSIS — E0865 Diabetes mellitus due to underlying condition with hyperglycemia: Secondary | ICD-10-CM

## 2023-03-02 ENCOUNTER — Telehealth: Payer: Self-pay

## 2023-03-02 DIAGNOSIS — E0865 Diabetes mellitus due to underlying condition with hyperglycemia: Secondary | ICD-10-CM

## 2023-03-02 MED ORDER — ONETOUCH ULTRA TEST VI STRP: 1.0000 | ORAL_STRIP | Freq: Every day | 3 refills | Status: AC

## 2023-03-02 NOTE — Telephone Encounter (Signed)
Message left on clinical intake voicemail:   Pharmacy is requesting a return call to clarify diabetic supplies (test strips) instructions  RX updated and resubmitted

## 2023-03-14 DIAGNOSIS — G4700 Insomnia, unspecified: Secondary | ICD-10-CM | POA: Diagnosis not present

## 2023-03-14 DIAGNOSIS — F331 Major depressive disorder, recurrent, moderate: Secondary | ICD-10-CM | POA: Diagnosis not present

## 2023-03-14 DIAGNOSIS — R454 Irritability and anger: Secondary | ICD-10-CM | POA: Diagnosis not present

## 2023-03-15 DIAGNOSIS — M316 Other giant cell arteritis: Secondary | ICD-10-CM | POA: Diagnosis not present

## 2023-04-07 DIAGNOSIS — Z79899 Other long term (current) drug therapy: Secondary | ICD-10-CM | POA: Diagnosis not present

## 2023-04-07 DIAGNOSIS — Z6821 Body mass index (BMI) 21.0-21.9, adult: Secondary | ICD-10-CM | POA: Diagnosis not present

## 2023-04-07 DIAGNOSIS — M0609 Rheumatoid arthritis without rheumatoid factor, multiple sites: Secondary | ICD-10-CM | POA: Diagnosis not present

## 2023-04-07 DIAGNOSIS — D649 Anemia, unspecified: Secondary | ICD-10-CM | POA: Diagnosis not present

## 2023-04-07 DIAGNOSIS — M316 Other giant cell arteritis: Secondary | ICD-10-CM | POA: Diagnosis not present

## 2023-04-11 DIAGNOSIS — F331 Major depressive disorder, recurrent, moderate: Secondary | ICD-10-CM | POA: Diagnosis not present

## 2023-04-11 DIAGNOSIS — G4700 Insomnia, unspecified: Secondary | ICD-10-CM | POA: Diagnosis not present

## 2023-04-12 DIAGNOSIS — M0609 Rheumatoid arthritis without rheumatoid factor, multiple sites: Secondary | ICD-10-CM | POA: Diagnosis not present

## 2023-04-12 DIAGNOSIS — M316 Other giant cell arteritis: Secondary | ICD-10-CM | POA: Diagnosis not present

## 2023-04-12 DIAGNOSIS — Z79899 Other long term (current) drug therapy: Secondary | ICD-10-CM | POA: Diagnosis not present

## 2023-04-12 DIAGNOSIS — D649 Anemia, unspecified: Secondary | ICD-10-CM | POA: Diagnosis not present

## 2023-04-13 LAB — LAB REPORT - SCANNED: EGFR: 48

## 2023-04-18 ENCOUNTER — Other Ambulatory Visit (HOSPITAL_COMMUNITY): Payer: Self-pay | Admitting: Cardiovascular Disease

## 2023-04-18 DIAGNOSIS — I700 Atherosclerosis of aorta: Secondary | ICD-10-CM

## 2023-04-26 ENCOUNTER — Ambulatory Visit (HOSPITAL_COMMUNITY)
Admission: RE | Admit: 2023-04-26 | Discharge: 2023-04-26 | Disposition: A | Discharge status: Home / Self Care | Source: Ambulatory Visit | Attending: Internal Medicine | Admitting: Internal Medicine

## 2023-04-26 ENCOUNTER — Ambulatory Visit (HOSPITAL_BASED_OUTPATIENT_CLINIC_OR_DEPARTMENT_OTHER)
Admission: RE | Admit: 2023-04-26 | Discharge: 2023-04-26 | Disposition: A | Discharge status: Home / Self Care | Source: Ambulatory Visit | Attending: Internal Medicine | Admitting: Internal Medicine

## 2023-04-26 DIAGNOSIS — I700 Atherosclerosis of aorta: Secondary | ICD-10-CM | POA: Insufficient documentation

## 2023-04-26 LAB — VAS US ABI WITH/WO TBI
Left ABI: 0.97
Right ABI: 0.94

## 2023-05-09 ENCOUNTER — Ambulatory Visit: Attending: Cardiovascular Disease | Admitting: Cardiovascular Disease

## 2023-05-09 ENCOUNTER — Encounter: Payer: Self-pay | Admitting: Cardiovascular Disease

## 2023-05-09 VITALS — BP 144/74 | HR 81 | Ht 61.0 in | Wt 113.4 lb

## 2023-05-09 DIAGNOSIS — E1169 Type 2 diabetes mellitus with other specified complication: Secondary | ICD-10-CM | POA: Diagnosis not present

## 2023-05-09 DIAGNOSIS — E781 Pure hyperglyceridemia: Secondary | ICD-10-CM | POA: Diagnosis not present

## 2023-05-09 DIAGNOSIS — I700 Atherosclerosis of aorta: Secondary | ICD-10-CM | POA: Diagnosis not present

## 2023-05-09 DIAGNOSIS — Z955 Presence of coronary angioplasty implant and graft: Secondary | ICD-10-CM | POA: Insufficient documentation

## 2023-05-09 DIAGNOSIS — E782 Mixed hyperlipidemia: Secondary | ICD-10-CM | POA: Insufficient documentation

## 2023-05-09 DIAGNOSIS — Z95828 Presence of other vascular implants and grafts: Secondary | ICD-10-CM | POA: Diagnosis not present

## 2023-05-09 MED ORDER — ROSUVASTATIN CALCIUM 20 MG PO TABS: 10.0000 mg | ORAL_TABLET | Freq: Every day | ORAL | 3 refills | Status: AC

## 2023-05-09 NOTE — Patient Instructions (Addendum)
 Medication Instructions:  Your physician has recommended you make the following change in your medication:   -Increase rosuvastatin (crestor) to 20mg  once daily.  *If you need a refill on your cardiac medications before your next appointment, please call your pharmacy*  Lab Work: Your physician recommends that you return for lab work in: 3 months for FASTING Lipid/liver panel.  If you have labs (blood work) drawn today and your tests are completely normal, you will receive your results only by: MyChart Message (if you have MyChart) OR A paper copy in the mail If you have any lab test that is abnormal or we need to change your treatment, we will call you to review the results.  Testing/Procedures: Your physician has requested that you have an Aorta/Iliac Duplex.   No food after 11PM the night before.  Water is OK. (Don't drink liquids if you have been instructed not to for ANOTHER test) Avoid foods that produce bowel gas, for 24 hours prior to exam (see below). No breakfast, no chewing gum, no smoking or carbonated beverages. Patient may take morning medications with water. Come in for test at least 15 minutes early to register. **To do in March 2026**  Please note: We ask at that you not bring children with you during ultrasound (echo/ vascular) testing. Due to room size and safety concerns, children are not allowed in the ultrasound rooms during exams. Our front office staff cannot provide observation of children in our lobby area while testing is being conducted. An adult accompanying a patient to their appointment will only be allowed in the ultrasound room at the discretion of the ultrasound technician under special circumstances. We apologize for any inconvenience.  Your physician has requested that you have an ankle brachial index (ABI). During this test an ultrasound and blood pressure cuff are used to evaluate the arteries that supply the arms and legs with blood. Allow thirty  minutes for this exam. There are no restrictions or special instructions. **To do in March 2026**   Please note: We ask at that you not bring children with you during ultrasound (echo/ vascular) testing. Due to room size and safety concerns, children are not allowed in the ultrasound rooms during exams. Our front office staff cannot provide observation of children in our lobby area while testing is being conducted. An adult accompanying a patient to their appointment will only be allowed in the ultrasound room at the discretion of the ultrasound technician under special circumstances. We apologize for any inconvenience.   Follow-Up: At [facility], you and your health needs are our priority.  As part of our continuing mission to provide you with exceptional heart care, our providers are all part of one team.  This team includes your primary Cardiologist (physician) and Advanced Practice Providers or APPs (Physician Assistants and Nurse Practitioners) who all work together to provide you with the care you need, when you need it.  Your next appointment:   12 month(s)  Provider:   Nanetta Batty, MD     We recommend signing up for the patient portal called "MyChart".  Sign up information is provided on this After Visit Summary.  MyChart is used to connect with patients for Virtual Visits (Telemedicine).  Patients are able to view lab/test results, encounter notes, upcoming appointments, etc.  Non-urgent messages can be sent to your provider as well.   To learn more about what you can do with MyChart, go to [email or web address].   Other Instructions  1st Floor: - Lobby - Registration  - Pharmacy  - Lab - Cafe  2nd Floor: - PV Lab - Diagnostic Testing (echo, CT, nuclear med)  3rd Floor: - Vacant  4th Floor: - TCTS (cardiothoracic surgery) - AFib Clinic - Structural Heart Clinic - Vascular Surgery  - Vascular Ultrasound  5th Floor: - HeartCare Cardiology  (general and EP) - Clinical Pharmacy for coumadin, hypertension, lipid, weight-loss medications, and med management appointments    Valet parking services will be available as well.

## 2023-05-09 NOTE — Progress Notes (Signed)
 05/09/2023 Anne Pena   [birth year]  [id number]  Primary Physician Sharon Seller, NP Primary Cardiologist: Runell Gess MD Nicholes Calamity, [place]  HPI:  Anne Pena is a 89 y.o.   thin and fit appearing widowed Caucasian female mother of 3, grandmother and 3 grandchildren who is accompanied by 1 of her daughters Idalia Needle.  Since I saw her a year ago she has moved to [facility] assisted care facility.  She was referred by Dr. Sharee Holster to be established in my practice because of known history of CAD and PAD.  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    I last saw her in the office 04/18/2020.  Risk factors include 50 pack years of tobacco abuse having quit 11 years ago, treated hypertension, diabetes and hyperlipidemia.  She is never had a heart attack or stroke.  She did have coronary intervention by Dr. Salomon Fick at [facility] in 2000.  She had aortobifemoral bypass grafting at [facility] in [place] in 2004.  She denies chest pain, shortness of breath or claudication.  She was told that she did have moderate left ICA stenosis.   Since I saw her in the office a year and a half ago she continues to do well.  She has transition to [facility] assisted care facility.  They have physical physical activity options which she partakes in on a daily basis.  She denies chest pain or shortness of breath.  Current Meds  Medication Sig   Cholecalciferol (VITAMIN D3) 50 MCG (2000 UT) TABS Take 1 tablet by mouth every other day.   denosumab (PROLIA) 60 MG/ML SOSY injection Inject 60 mg into the skin every 6 (six) months.   glucose blood (ONETOUCH ULTRA TEST) test strip 1 each by Other route daily. E11.69   losartan (COZAAR) 100 MG tablet Take 1 tablet (100 mg total) by mouth daily.   metFORMIN (GLUCOPHAGE) 500 MG tablet TAKE 2 TABLETS BY MOUTH IN THE MORNING AND 1 TABLET WITH EVENING MEAL   metoprolol tartrate (LOPRESSOR) 25 MG tablet TAKE 1 TABLET BY MOUTH IN THE MORNING AND  1/2 TABLET IN THE EVENING   mirtazapine (REMERON) 30 MG tablet Take 30 mg by mouth at bedtime.   Multiple Vitamins-Minerals (ICAPS AREDS 2) CAPS Take 1 capsule by mouth 2 (two) times daily. Every morning and every evening   Multiple Vitamins-Minerals (WOMENS MULTI GUMMIES PO) Take 1 tablet by mouth in the morning and at bedtime.   omeprazole (PRILOSEC) 20 MG capsule TAKE 1 CAPSULE BY MOUTH DAILY.   rosuvastatin (CRESTOR) 10 MG tablet Take 1 tablet (10 mg total) by mouth at bedtime.   Tocilizumab (ACTEMRA IV) Inject into the vein every 30 (thirty) days. Administered by rheumatologist for autoimmune disease   XARELTO 2.5 MG TABS tablet TAKE 1 TABLET BY MOUTH 2 TIMES DAILY.   Current Facility-Administered Medications for the 05/09/23 encounter (Office Visit) with Runell Gess, MD  Medication   denosumab (PROLIA) injection 60 mg   [START ON 06/17/2023] denosumab (PROLIA) injection 60 mg     Allergies  Allergen Reactions   Benadryl [Diphenhydramine]     Side effects per records received from [facility] Rheumatology    Lipitor [Atorvastatin]     rash   Tricor [Fenofibrate]     rash    Social History   Socioeconomic History   Marital status: Widowed    Spouse name: Not on file   Number of children: Not on file  Years of education: Not on file   Highest education level: Not on file  Occupational History   Not on file  Tobacco Use   Smoking status: Former    Current packs/day: 0.00    Average packs/day: 1 pack/day for 64.0 years (64.0 ttl pk-yrs)    Types: Cigarettes    Start date: 04/06/1947    Quit date: 04/06/2011    Years since quitting: 12.0   Smokeless tobacco: Never  Vaping Use   Vaping status: Never Used  Substance and Sexual Activity   Alcohol use: Not Currently   Drug use: Never   Sexual activity: Not Currently  Other Topics Concern   Not on file  Social History Narrative   Not on file   Social Drivers of Health   Financial Resource Strain: Not on file   Food Insecurity: Not on file  Transportation Needs: Not on file  Physical Activity: Not on file  Stress: Not on file  Social Connections: Not on file  Intimate Partner Violence: Not on file     Review of Systems: General: negative for chills, fever, night sweats or weight changes.  Cardiovascular: negative for chest pain, dyspnea on exertion, edema, orthopnea, palpitations, paroxysmal nocturnal dyspnea or shortness of breath Dermatological: negative for rash Respiratory: negative for cough or wheezing Urologic: negative for hematuria Abdominal: negative for nausea, vomiting, diarrhea, bright red blood per rectum, melena, or hematemesis Neurologic: negative for visual changes, syncope, or dizziness All other systems reviewed and are otherwise negative except as noted above.    Blood pressure (!) 144/74, pulse 81, height 5\' 1"  (1.549 m), weight 113 lb 6.4 oz (51.4 kg), SpO2 99%.  General appearance: alert and no distress Neck: no adenopathy, no carotid bruit, no JVD, supple, symmetrical, trachea midline, and thyroid not enlarged, symmetric, no tenderness/mass/nodules Lungs: clear to auscultation bilaterally Heart: regular rate and rhythm, S1, S2 normal, no murmur, click, rub or gallop Extremities: extremities normal, atraumatic, no cyanosis or edema Pulses: 2+ and symmetric Skin: Skin color, texture, turgor normal. No rashes or lesions Neurologic: Grossly normal  EKG EKG Interpretation Date/Time:  Monday May 09 2023 10:06:04 EDT Ventricular Rate:  81 PR Interval:  180 QRS Duration:  66 QT Interval:  364 QTC Calculation: 422 R Axis:   7  Text Interpretation: Normal sinus rhythm Possible Left atrial enlargement Septal infarct , age undetermined No previous ECGs available Confirmed by Nanetta Batty [phone]) on 05/09/2023 10:21:03 AM    ASSESSMENT AND PLAN:   Presence of stent in left circumflex coronary artery History of CAD status post left circumflex stenting by Dr. Salomon Fick at [facility] in 2000.  She is completely asymptomatic.  Mixed diabetic hyperlipidemia associated with type 2 diabetes mellitus (HCC) History of hyperlipidemia on statin therapy with lipid profile performed 12/16/2022 revealing total cholesterol 188, LDL 97 and HDL of 51.  She is on rosuvastatin 10 mg a day.  She is not at goal for secondary prevention.  I am going to increase rosuvastatin to 20 mg a day and we will recheck a lipid liver profile in 3 months.  S/P aorto-bifemoral bypass surgery History of aortobifemoral bypass grafting at [facility] in 2004.  We have been following Doppler study since which most recently performed 04/26/2023 revealed stable ABIs with mild insertion stenosis.  She is completely asymptomatic.     Runell Gess MD FACP,FACC,FAHA, [facility] 05/09/2023 10:34 AM

## 2023-05-09 NOTE — Assessment & Plan Note (Signed)
 History of CAD status post left circumflex stenting by Dr. Salomon Fick at [facility] in 2000.  She is completely asymptomatic.

## 2023-05-09 NOTE — Assessment & Plan Note (Signed)
 History of aortobifemoral bypass grafting at [facility] in 2004.  We have been following Doppler study since which most recently performed 04/26/2023 revealed stable ABIs with mild insertion stenosis.  She is completely asymptomatic.

## 2023-05-09 NOTE — Assessment & Plan Note (Signed)
 History of hyperlipidemia on statin therapy with lipid profile performed 12/16/2022 revealing total cholesterol 188, LDL 97 and HDL of 51.  She is on rosuvastatin 10 mg a day.  She is not at goal for secondary prevention.  I am going to increase rosuvastatin to 20 mg a day and we will recheck a lipid liver profile in 3 months.

## 2023-05-10 DIAGNOSIS — M316 Other giant cell arteritis: Secondary | ICD-10-CM | POA: Diagnosis not present

## 2023-05-23 DIAGNOSIS — G4700 Insomnia, unspecified: Secondary | ICD-10-CM | POA: Diagnosis not present

## 2023-05-23 DIAGNOSIS — F331 Major depressive disorder, recurrent, moderate: Secondary | ICD-10-CM | POA: Diagnosis not present

## 2023-05-23 DIAGNOSIS — G3184 Mild cognitive impairment, so stated: Secondary | ICD-10-CM | POA: Diagnosis not present

## 2023-05-24 ENCOUNTER — Telehealth: Payer: Self-pay | Admitting: *Deleted

## 2023-05-24 NOTE — Telephone Encounter (Signed)
 Received [employer] Verification back for patient's Prolia Injection.  No Copay, No Prior Auth.   Tried calling patient to schedule a lab appointment before Prolia injection on 06/17/23. LMOM to return call.

## 2023-06-07 DIAGNOSIS — M316 Other giant cell arteritis: Secondary | ICD-10-CM | POA: Diagnosis not present

## 2023-06-09 DIAGNOSIS — H35372 Puckering of macula, left eye: Secondary | ICD-10-CM | POA: Diagnosis not present

## 2023-06-09 DIAGNOSIS — H43813 Vitreous degeneration, bilateral: Secondary | ICD-10-CM | POA: Diagnosis not present

## 2023-06-09 DIAGNOSIS — E119 Type 2 diabetes mellitus without complications: Secondary | ICD-10-CM | POA: Diagnosis not present

## 2023-06-09 DIAGNOSIS — H353211 Exudative age-related macular degeneration, right eye, with active choroidal neovascularization: Secondary | ICD-10-CM | POA: Diagnosis not present

## 2023-06-09 DIAGNOSIS — H353122 Nonexudative age-related macular degeneration, left eye, intermediate dry stage: Secondary | ICD-10-CM | POA: Diagnosis not present

## 2023-06-09 DIAGNOSIS — H47012 Ischemic optic neuropathy, left eye: Secondary | ICD-10-CM | POA: Diagnosis not present

## 2023-06-14 NOTE — Telephone Encounter (Signed)
 Tried calling patient. LMOM to return call.  Needing to schedule a lab appointment before getting Prolia .

## 2023-06-15 DIAGNOSIS — I10 Essential (primary) hypertension: Secondary | ICD-10-CM | POA: Diagnosis not present

## 2023-06-15 DIAGNOSIS — E1169 Type 2 diabetes mellitus with other specified complication: Secondary | ICD-10-CM | POA: Diagnosis not present

## 2023-06-15 DIAGNOSIS — K219 Gastro-esophageal reflux disease without esophagitis: Secondary | ICD-10-CM | POA: Diagnosis not present

## 2023-06-17 ENCOUNTER — Other Ambulatory Visit

## 2023-06-17 ENCOUNTER — Telehealth: Payer: Self-pay | Admitting: *Deleted

## 2023-06-17 ENCOUNTER — Other Ambulatory Visit: Payer: Self-pay | Admitting: Nurse Practitioner

## 2023-06-17 ENCOUNTER — Ambulatory Visit

## 2023-06-17 ENCOUNTER — Ambulatory Visit: Payer: Medicare Other

## 2023-06-17 DIAGNOSIS — M810 Age-related osteoporosis without current pathological fracture: Secondary | ICD-10-CM | POA: Diagnosis not present

## 2023-06-17 DIAGNOSIS — E0865 Diabetes mellitus due to underlying condition with hyperglycemia: Secondary | ICD-10-CM

## 2023-06-17 DIAGNOSIS — D509 Iron deficiency anemia, unspecified: Secondary | ICD-10-CM | POA: Diagnosis not present

## 2023-06-17 NOTE — Telephone Encounter (Signed)
 Patient was scheduled for today but needs Labs done before Prolia  Injection.  (Prolia / No Copay, No PA---AAM (awaiting Labs) Tried calling patient to schedule labs and [facility] [phone])   Patient is coming in today at 1:45 for lab draw, please place orders.

## 2023-06-17 NOTE — Telephone Encounter (Signed)
Orders placed and printed

## 2023-06-20 ENCOUNTER — Encounter: Payer: Self-pay | Admitting: Nurse Practitioner

## 2023-06-20 DIAGNOSIS — E781 Pure hyperglyceridemia: Secondary | ICD-10-CM | POA: Diagnosis not present

## 2023-06-20 DIAGNOSIS — M069 Rheumatoid arthritis, unspecified: Secondary | ICD-10-CM | POA: Diagnosis not present

## 2023-06-21 ENCOUNTER — Ambulatory Visit: Payer: Self-pay

## 2023-06-21 LAB — CBC WITH DIFFERENTIAL/PLATELET
Absolute Lymphocytes: 1382 {cells}/uL (ref 850–3900)
Absolute Monocytes: 936 {cells}/uL (ref 200–950)
Basophils Absolute: 22 {cells}/uL (ref 0–200)
Basophils Relative: 0.3 %
Eosinophils Absolute: 230 {cells}/uL (ref 15–500)
Eosinophils Relative: 3.2 %
HCT: 35.7 % (ref 35.0–45.0)
Hemoglobin: 11 g/dL — ABNORMAL LOW (ref 11.7–15.5)
MCH: 25.2 pg — ABNORMAL LOW (ref 27.0–33.0)
MCHC: 30.8 g/dL — ABNORMAL LOW (ref 32.0–36.0)
MCV: 81.7 fL (ref 80.0–100.0)
MPV: 11 fL (ref 7.5–12.5)
Monocytes Relative: 13 %
Neutro Abs: 4630 {cells}/uL (ref 1500–7800)
Neutrophils Relative %: 64.3 %
Platelets: 201 10*3/uL (ref 140–400)
RBC: 4.37 10*6/uL (ref 3.80–5.10)
RDW: 14.9 % (ref 11.0–15.0)
Total Lymphocyte: 19.2 %
WBC: 7.2 10*3/uL (ref 3.8–10.8)

## 2023-06-21 LAB — COMPLETE METABOLIC PANEL WITHOUT GFR
AG Ratio: 2.5 (calc) (ref 1.0–2.5)
ALT: 17 U/L (ref 6–29)
AST: 17 U/L (ref 10–35)
Albumin: 4.9 g/dL (ref 3.6–5.1)
Alkaline phosphatase (APISO): 42 U/L (ref 37–153)
BUN/Creatinine Ratio: 21 (calc) (ref 6–22)
BUN: 31 mg/dL — ABNORMAL HIGH (ref 7–25)
CO2: 26 mmol/L (ref 20–32)
Calcium: 10.4 mg/dL (ref 8.6–10.4)
Chloride: 105 mmol/L (ref 98–110)
Creat: 1.49 mg/dL — ABNORMAL HIGH (ref 0.60–0.95)
Globulin: 2 g/dL (ref 1.9–3.7)
Glucose, Bld: 86 mg/dL (ref 65–139)
Potassium: 4.5 mmol/L (ref 3.5–5.3)
Sodium: 142 mmol/L (ref 135–146)
Total Bilirubin: 0.5 mg/dL (ref 0.2–1.2)
Total Protein: 6.9 g/dL (ref 6.1–8.1)

## 2023-06-21 LAB — HEMOGLOBIN A1C
Hgb A1c MFr Bld: 6.9 % — ABNORMAL HIGH (ref <5.7)
Mean Plasma Glucose: 151 mg/dL
eAG (mmol/L): 8.4 mmol/L

## 2023-06-21 LAB — TEST AUTHORIZATION

## 2023-06-21 LAB — IRON,TIBC AND FERRITIN PANEL
%SAT: 7 % — ABNORMAL LOW (ref 16–45)
Ferritin: 15 ng/mL — ABNORMAL LOW (ref 16–288)
Iron: 32 ug/dL — ABNORMAL LOW (ref 45–160)
TIBC: 453 ug/dL — ABNORMAL HIGH (ref 250–450)

## 2023-07-05 DIAGNOSIS — M316 Other giant cell arteritis: Secondary | ICD-10-CM | POA: Diagnosis not present

## 2023-07-05 DIAGNOSIS — M0609 Rheumatoid arthritis without rheumatoid factor, multiple sites: Secondary | ICD-10-CM | POA: Diagnosis not present

## 2023-07-05 DIAGNOSIS — R5383 Other fatigue: Secondary | ICD-10-CM | POA: Diagnosis not present

## 2023-07-05 DIAGNOSIS — Z111 Encounter for screening for respiratory tuberculosis: Secondary | ICD-10-CM | POA: Diagnosis not present

## 2023-07-05 DIAGNOSIS — Z79899 Other long term (current) drug therapy: Secondary | ICD-10-CM | POA: Diagnosis not present

## 2023-07-05 LAB — LAB REPORT - SCANNED
EGFR: 33
HM Hepatitis Screen: NEGATIVE

## 2023-07-18 ENCOUNTER — Ambulatory Visit (INDEPENDENT_AMBULATORY_CARE_PROVIDER_SITE_OTHER): Admitting: Nurse Practitioner

## 2023-07-18 ENCOUNTER — Encounter: Payer: Self-pay | Admitting: Nurse Practitioner

## 2023-07-18 VITALS — BP 108/78 | HR 81 | Temp 97.9°F | Resp 13 | Ht 61.0 in | Wt 112.8 lb

## 2023-07-18 DIAGNOSIS — M810 Age-related osteoporosis without current pathological fracture: Secondary | ICD-10-CM

## 2023-07-18 DIAGNOSIS — D509 Iron deficiency anemia, unspecified: Secondary | ICD-10-CM | POA: Diagnosis not present

## 2023-07-18 DIAGNOSIS — N183 Chronic kidney disease, stage 3 unspecified: Secondary | ICD-10-CM

## 2023-07-18 DIAGNOSIS — M316 Other giant cell arteritis: Secondary | ICD-10-CM | POA: Diagnosis not present

## 2023-07-18 DIAGNOSIS — E782 Mixed hyperlipidemia: Secondary | ICD-10-CM

## 2023-07-18 DIAGNOSIS — F320 Major depressive disorder, single episode, mild: Secondary | ICD-10-CM | POA: Diagnosis not present

## 2023-07-18 DIAGNOSIS — N1831 Chronic kidney disease, stage 3a: Secondary | ICD-10-CM

## 2023-07-18 DIAGNOSIS — Z95828 Presence of other vascular implants and grafts: Secondary | ICD-10-CM

## 2023-07-18 DIAGNOSIS — I10 Essential (primary) hypertension: Secondary | ICD-10-CM | POA: Diagnosis not present

## 2023-07-18 DIAGNOSIS — E0822 Diabetes mellitus due to underlying condition with diabetic chronic kidney disease: Secondary | ICD-10-CM

## 2023-07-18 MED ORDER — DENOSUMAB 60 MG/ML ~~LOC~~ SOSY: 60.0000 mg | PREFILLED_SYRINGE | SUBCUTANEOUS | Status: AC

## 2023-07-18 NOTE — Progress Notes (Signed)
 Careteam: Patient Care Team: Verma Gobble, NP as PCP - General (Geriatric Medicine) Avanell Leigh, MD as PCP - Cardiology (Cardiology) Cindra Cree, MD as Consulting Physician (Ophthalmology) [facility], Warren Haber, PA as Physician Assistant (Cardiology) Tita Form, MD as Consulting Physician (Internal Medicine)  PLACE OF SERVICE:  [facility]  Advanced Directive information    Allergies  Allergen Reactions   Benadryl [Diphenhydramine]     Side effects per records received from [facility] Rheumatology    Lipitor [Atorvastatin]     rash   Tricor [Fenofibrate]     rash    Chief Complaint  Patient presents with   Medical Management of Chronic Issues    Follow up prolia  injection    HPI:  Discussed the use of AI scribe software for clinical note transcription with the patient, who gave verbal consent to proceed.  History of Present Illness Anne Pena is an 89 year old female with chronic kidney disease and iron deficiency anemia who presents for a six-month follow-up.  She has been receiving Prolia  injections and recently had lab work done.   Worsening of Cr noted on labs No use of NSAIDs like Advil or Aleve, only using Tylenol for pain management.  She has had low iron levels and has started taking iron supplements. Since starting the supplements, she has experienced dark stools, which she did not have before. She is unsure how many iron tablets she has left but takes one in the morning. She does not prefer red meat and has been eating more beans and green vegetables. She has been experiencing constipation, likely due to the iron supplements, and is using stool softeners to manage this.  Her diabetes management includes taking metformin , with a dosage of 1000 mg in the morning and 500 mg in the evening. Her A1c is currently 6.9, indicating good control.  She is on Actemra  for giant cell arteritis, which she believes helps maintain her vision.   Her  cardiologist, Dr. Katheryne Pane, has increased her rosuvastatin  to 20 mg daily due to her cholesterol levels not being at goal. She has been seeing her cardiologist for five years, and her ABI was unchanged at her last visit in March.    Review of Systems:  Review of Systems  Constitutional:  Negative for chills, fever and weight loss.  HENT:  Negative for tinnitus.   Respiratory:  Negative for cough, sputum production and shortness of breath.   Cardiovascular:  Negative for chest pain, palpitations and leg swelling.  Gastrointestinal:  Negative for abdominal pain, constipation, diarrhea and heartburn.  Genitourinary:  Negative for dysuria, frequency and urgency.  Musculoskeletal:  Negative for back pain, falls, joint pain and myalgias.  Skin: Negative.   Neurological:  Negative for dizziness and headaches.  Psychiatric/Behavioral:  Negative for depression and memory loss. The patient does not have insomnia.     Past Medical History:  Diagnosis Date   Anemia    per Records from [facility] Rheumatology   Atherosclerosis of aorta [facility])    s/p AO bypass 2004, Per records received from [facility]    Bladder cancer [facility]) 02/10/2010   CAD (coronary artery disease)    Coronary intervention [facility] in 2000.   Diabetes mellitus without complication (HCC)    Former tobacco use    Per records received from [facility] Rheumatology    Former tobacco use    1-2 ppd x 55 years, quit 2013) per Records from [facility] Rhe[facility]   GCA (giant cell arteritis) (HCC)  Giant cell arteritis (HCC)    Per records received from [facility] Rheumatology    Giant cell arteritis (HCC)    per Records from [facility] Rheumatology   Hyperlipidemia    Hypertension    Macular degeneration    Osteoporosis    Prolia  injection administered on 05/09/2019, Per records received from [facility] Rheumatology    PAD (peripheral artery disease) [facility])    s/p bilateral iliac-femoral artery bypass 2000, Per  records received from [facility] Rheumatology    Peripheral vascular disease of lower extremity (HCC)    aortobifemoral bypass grafting at [facility] in [place] in 2004.    Presence of stent in left circumflex coronary artery    Rheumatoid arthritis [facility])    Per records received from [facility] Rheumatology    Past Surgical History:  Procedure Laterality Date   AORTO-FEMORAL BYPASS GRAFT     aortobifemoral bypass grafting at [facility] in [place] in 2004.    CATARACT EXTRACTION     HERNIA REPAIR     KIDNEY SURGERY  04/06/2011   URETER SURGERY  05/05/2011   Social History:   reports that she quit smoking about 12 years ago. Her smoking use included cigarettes. She started smoking about 76 years ago. She has a 64 pack-year smoking history. She has never used smokeless tobacco. She reports that she does not currently use alcohol . She reports that she does not use drugs.  Family History  Problem Relation Age of Onset   Stroke Mother    Hypertension Mother        Per records received from [facility]ology    Lung cancer Father    Heart attack Brother    Hyperlipidemia Brother    Hypertension Brother    Diabetes Maternal Uncle    Diabetes Maternal Grandmother    Hypertension Son        Per records received from [facility]    Medications: Patient's Medications  New Prescriptions   No medications on file  Previous Medications   ACETAMINOPHEN (TYLENOL) 325 MG TABLET    Take 650 mg by mouth every 6 (six) hours as needed.   B COMPLEX VITAMINS TABLET    Take 1 tablet by mouth daily.   CALCIUM  CARBONATE (CALCIUM  500 PO)    Take 1 tablet by mouth in the morning and at bedtime. Only 2 weeks prior to Prolia  injection and 2 weeks after   CHOLECALCIFEROL (VITAMIN D3) 50 MCG (2000 UT) TABS    Take 1 tablet by mouth every other day.   DENOSUMAB  (PROLIA ) 60 MG/ML SOSY INJECTION    Inject 60 mg into the skin every 6 (six) months.   GLUCOSE BLOOD (ONETOUCH ULTRA TEST)  TEST STRIP    1 each by Other route daily. E11.69   LOSARTAN  (COZAAR ) 100 MG TABLET    Take 1 tablet (100 mg total) by mouth daily.   METFORMIN  (GLUCOPHAGE ) 500 MG TABLET    TAKE 2 TABLETS BY MOUTH IN THE MORNING AND 1 TABLET WITH EVENING MEAL   METOPROLOL  TARTRATE (LOPRESSOR ) 25 MG TABLET    TAKE 1 TABLET BY MOUTH IN THE MORNING AND 1/2 TABLET IN THE EVENING   MIRTAZAPINE  (REMERON ) 30 MG TABLET    Take 30 mg by mouth at bedtime.   MULTIPLE VITAMINS-MINERALS (ICAPS AREDS 2) CAPS    Take 1 capsule by mouth 2 (two) times daily. Every morning and every evening   MULTIPLE VITAMINS-MINERALS (WOMENS MULTI GUMMIES PO)    Take 1 tablet by mouth in  the morning and at bedtime.   OMEPRAZOLE  (PRILOSEC) 20 MG CAPSULE    TAKE 1 CAPSULE BY MOUTH DAILY.   ROSUVASTATIN  (CRESTOR ) 20 MG TABLET    Take 0.5 tablets (10 mg total) by mouth at bedtime.   TOCILIZUMAB  (ACTEMRA  IV)    Inject into the vein every 30 (thirty) days. Administered by rheumatologist for autoimmune disease   XARELTO  2.5 MG TABS TABLET    TAKE 1 TABLET BY MOUTH 2 TIMES DAILY.  Modified Medications   No medications on file  Discontinued Medications   No medications on file    Physical Exam:  Vitals:   07/18/23 1507  BP: 108/78  Pulse: 81  Resp: 13  Temp: 97.9 F (36.6 C)  TempSrc: Temporal  SpO2: 93%  Weight: 112 lb 12.8 oz (51.2 kg)  Height: 5' 1 (1.549 m)   Body mass index is 21.31 kg/m. Wt Readings from Last 3 Encounters:  07/18/23 112 lb 12.8 oz (51.2 kg)  05/09/23 113 lb 6.4 oz (51.4 kg)  01/14/23 111 lb (50.3 kg)    Physical Exam Constitutional:      General: She is not in acute distress.    Appearance: She is well-developed. She is not diaphoretic.  HENT:     Head: Normocephalic and atraumatic.     Mouth/Throat:     Pharynx: No oropharyngeal exudate.   Eyes:     Conjunctiva/sclera: Conjunctivae normal.     Pupils: Pupils are equal, round, and reactive to light.    Cardiovascular:     Rate and Rhythm: Normal  rate and regular rhythm.     Heart sounds: Normal heart sounds.  Pulmonary:     Effort: Pulmonary effort is normal.     Breath sounds: Normal breath sounds.  Abdominal:     General: Bowel sounds are normal.     Palpations: Abdomen is soft.   Musculoskeletal:     Cervical back: Normal range of motion and neck supple.     Right lower leg: No edema.     Left lower leg: No edema.   Skin:    General: Skin is warm and dry.   Neurological:     Mental Status: She is alert.   Psychiatric:        Mood and Affect: Mood normal.     Labs reviewed: Basic Metabolic Panel: Recent Labs    09/13/22 0928 12/16/22 0900 06/17/23 1330  NA 143 144 142  K 4.7 4.5 4.5  CL 107 106 105  CO2 27 26 26   GLUCOSE 115* 141* 86  BUN 22 26* 31*  CREATININE 1.10* 1.09* 1.49*  CALCIUM  9.5 9.7 10.4   Liver Function Tests: Recent Labs    09/13/22 0928 12/16/22 0900 06/17/23 1330  AST 20 16 17   ALT 24 18 17   BILITOT 0.5 0.7 0.5  PROT 6.4 6.5 6.9   No results for input(s): LIPASE, AMYLASE in the last 8760 hours. No results for input(s): AMMONIA in the last 8760 hours. CBC: Recent Labs    09/13/22 0928 06/17/23 1330  WBC 6.3 7.2  NEUTROABS 4,082 4,630  HGB 12.9 11.0*  HCT 38.4 35.7  MCV 91.6 81.7  PLT 149 201   Lipid Panel: Recent Labs    09/13/22 0928 12/16/22 0900  CHOL 189 188  HDL 44* 51  LDLCALC 105* 97  TRIG 300* 298*  CHOLHDL 4.3 3.7   TSH: No results for input(s): TSH in the last 8760 hours. A1C: Lab Results  Component Value Date  HGBA1C 6.9 (H) 06/17/2023     Assessment/Plan Osteoporosis, unspecified osteoporosis type, unspecified pathological fracture presence Assessment & Plan: Recommended to take calcium  600 mg twice daily with Vitamin D  2000 units daily and weight bearing activity 30 mins/5 days a week Continues on prolia  every 6 months   Orders: -     Denosumab   GCA (giant cell arteritis) (HCC) Assessment & Plan: Continues to follow up  with rheumatology, continues on actemra  monthly infusion.    Diabetes mellitus due to underlying condition with stage 3a chronic kidney disease, without long-term current use of insulin (HCC) Assessment & Plan: A1c at goal on metformin , Encouraged dietary compliance, routine foot care/monitoring and to keep up with diabetic eye exams through ophthalmology     S/P aorto-bifemoral bypass surgery Assessment & Plan: Continues on xarelto . Without symptoms at this time.    Mixed hyperlipidemia Assessment & Plan: Recent increase in crestor  to get LDL to goal.   Orders: -     COMPLETE METABOLIC PANEL WITHOUT GFR; Future -     Lipid panel; Future  Iron deficiency anemia, unspecified iron deficiency anemia type Assessment & Plan: Continues on supplement.    Essential hypertension Assessment & Plan: Blood pressure well controlled, goal bp <140/90 Continue current medications and dietary modifications follow metabolic panel   Current mild episode of major depressive disorder, unspecified whether recurrent (HCC) Assessment & Plan: Mood has been well controlled on remeron  30 mg daily    Stage 3 chronic kidney disease, unspecified whether stage 3a or 3b CKD (HCC) Assessment & Plan: Worsening Cr on last labs,  Encourage proper hydration and to avoid NSAIDS (Aleve, Advil, Motrin, Ibuprofen)  Will follow up bmp Adjust medications as needed and if continues to worsen will send to nephrology      Return in about 6 months (around 01/17/2024) for routine follow up, labs prior to visit.  Annalysse Shoemaker K. Denney Fisherman [facility] [phone]

## 2023-07-18 NOTE — Patient Instructions (Signed)
 Make fasting lab appt in 4- 6 weeks

## 2023-07-25 DIAGNOSIS — E782 Mixed hyperlipidemia: Secondary | ICD-10-CM | POA: Insufficient documentation

## 2023-07-25 DIAGNOSIS — N183 Chronic kidney disease, stage 3 unspecified: Secondary | ICD-10-CM | POA: Insufficient documentation

## 2023-07-25 DIAGNOSIS — M810 Age-related osteoporosis without current pathological fracture: Secondary | ICD-10-CM | POA: Insufficient documentation

## 2023-07-25 DIAGNOSIS — D509 Iron deficiency anemia, unspecified: Secondary | ICD-10-CM | POA: Insufficient documentation

## 2023-07-25 NOTE — Assessment & Plan Note (Signed)
Continues on supplement 

## 2023-07-25 NOTE — Assessment & Plan Note (Signed)
 Continues to follow up with rheumatology, continues on actemra  monthly infusion.

## 2023-07-25 NOTE — Assessment & Plan Note (Signed)
 Continues on xarelto . Without symptoms at this time.

## 2023-07-25 NOTE — Assessment & Plan Note (Signed)
 Recommended to take calcium  600 mg twice daily with Vitamin D  2000 units daily and weight bearing activity 30 mins/5 days a week Continues on prolia  every 6 months

## 2023-07-25 NOTE — Assessment & Plan Note (Signed)
 Blood pressure well controlled, goal bp <140/90 Continue current medications and dietary modifications follow metabolic panel

## 2023-07-25 NOTE — Assessment & Plan Note (Signed)
 Recent increase in crestor  to get LDL to goal.

## 2023-07-25 NOTE — Assessment & Plan Note (Signed)
 Mood has been well controlled on remeron  30 mg daily

## 2023-07-25 NOTE — Assessment & Plan Note (Signed)
 Worsening Cr on last labs,  Encourage proper hydration and to avoid NSAIDS (Aleve, Advil, Motrin, Ibuprofen)  Will follow up bmp Adjust medications as needed and if continues to worsen will send to nephrology

## 2023-07-25 NOTE — Assessment & Plan Note (Signed)
 A1c at goal on metformin , Encouraged dietary compliance, routine foot care/monitoring and to keep up with diabetic eye exams through ophthalmology

## 2023-08-02 DIAGNOSIS — M316 Other giant cell arteritis: Secondary | ICD-10-CM | POA: Diagnosis not present

## 2023-08-02 DIAGNOSIS — M0609 Rheumatoid arthritis without rheumatoid factor, multiple sites: Secondary | ICD-10-CM | POA: Diagnosis not present

## 2023-08-30 DIAGNOSIS — M0609 Rheumatoid arthritis without rheumatoid factor, multiple sites: Secondary | ICD-10-CM | POA: Diagnosis not present

## 2023-08-30 DIAGNOSIS — M316 Other giant cell arteritis: Secondary | ICD-10-CM | POA: Diagnosis not present

## 2023-08-31 ENCOUNTER — Ambulatory Visit (INDEPENDENT_AMBULATORY_CARE_PROVIDER_SITE_OTHER): Admitting: Nurse Practitioner

## 2023-08-31 ENCOUNTER — Encounter: Payer: Self-pay | Admitting: Nurse Practitioner

## 2023-08-31 VITALS — BP 112/64 | HR 78 | Temp 97.4°F | Resp 12 | Ht 61.0 in | Wt 111.6 lb

## 2023-08-31 DIAGNOSIS — Z0000 Encounter for general adult medical examination without abnormal findings: Secondary | ICD-10-CM

## 2023-08-31 DIAGNOSIS — D509 Iron deficiency anemia, unspecified: Secondary | ICD-10-CM

## 2023-08-31 DIAGNOSIS — E782 Mixed hyperlipidemia: Secondary | ICD-10-CM

## 2023-08-31 DIAGNOSIS — E2839 Other primary ovarian failure: Secondary | ICD-10-CM | POA: Diagnosis not present

## 2023-08-31 LAB — COMPLETE METABOLIC PANEL WITHOUT GFR
AG Ratio: 2.3 (calc) (ref 1.0–2.5)
ALT: 18 U/L (ref 6–29)
AST: 16 U/L (ref 10–35)
Albumin: 4.6 g/dL (ref 3.6–5.1)
Alkaline phosphatase (APISO): 47 U/L (ref 37–153)
BUN/Creatinine Ratio: 20 (calc) (ref 6–22)
BUN: 28 mg/dL — ABNORMAL HIGH (ref 7–25)
CO2: 25 mmol/L (ref 20–32)
Calcium: 9.9 mg/dL (ref 8.6–10.4)
Chloride: 106 mmol/L (ref 98–110)
Creat: 1.42 mg/dL — ABNORMAL HIGH (ref 0.60–0.95)
Globulin: 2 g/dL (ref 1.9–3.7)
Glucose, Bld: 122 mg/dL — ABNORMAL HIGH (ref 65–99)
Potassium: 4.4 mmol/L (ref 3.5–5.3)
Sodium: 141 mmol/L (ref 135–146)
Total Bilirubin: 0.6 mg/dL (ref 0.2–1.2)
Total Protein: 6.6 g/dL (ref 6.1–8.1)

## 2023-08-31 NOTE — Patient Instructions (Signed)
  Anne Pena , Thank you for taking time to come for your Medicare Wellness Visit. I appreciate your ongoing commitment to your health goals. Please review the following plan we discussed and let me know if I can assist you in the future.   Be on the lookout for a call to schedule bone density.   This is a list of the screening recommended for you and due dates:  Health Maintenance  Topic Date Due   Eye exam for diabetics  08/10/2023   Flu Shot  09/09/2023   Hemoglobin A1C  12/18/2023   Complete foot exam   07/17/2024   Medicare Annual Wellness Visit  08/30/2024   DTaP/Tdap/Td vaccine (2 - Td or Tdap) 09/16/2029   Pneumococcal Vaccine for age over 42  Completed   DEXA scan (bone density measurement)  Completed   Zoster (Shingles) Vaccine  Completed   Hepatitis B Vaccine  Aged Out   HPV Vaccine  Aged Out   Meningitis B Vaccine  Aged Out   COVID-19 Vaccine  Discontinued

## 2023-08-31 NOTE — Progress Notes (Signed)
 Subjective:   Anne Pena is a 88 y.o. female who presents for Medicare Annual (Subsequent) preventive examination.  Visit Complete: In person psc  Cardiac Risk Factors include: family history of premature cardiovascular disease;sedentary lifestyle;hypertension;advanced age (>35men, >48 women);diabetes mellitus;dyslipidemia     Objective:    Today's Vitals   08/31/23 0908  BP: 112/64  Pulse: 78  Resp: 12  Temp: (!) 97.4 F (36.3 C)  SpO2: 95%  Weight: 111 lb 9.6 oz (50.6 kg)  Height: 5' 1 (1.549 m)   Body mass index is 21.09 kg/m.     01/14/2023   10:33 AM 03/15/2022   10:27 AM 09/11/2021    9:52 AM 03/06/2021    1:08 PM 01/09/2021    1:31 PM 09/05/2020    3:46 PM 04/23/2020    2:13 PM  Advanced Directives  Does Patient Have a Medical Advance Directive? Yes Yes Yes Yes Yes Yes Yes  Type of Advance Directive Living will Living will Living will Living will Living will Living will Healthcare Power of Attorney;Living will  Does patient want to make changes to medical advance directive? No - Patient declined No - Patient declined No - Patient declined No - Patient declined No - Patient declined No - Patient declined No - Patient declined  Copy of Healthcare Power of Attorney in Chart?       Yes - validated most recent copy scanned in chart (See row information)    Current Medications (verified) Outpatient Encounter Medications as of 08/31/2023  Medication Sig   acetaminophen (TYLENOL) 325 MG tablet Take 650 mg by mouth every 6 (six) hours as needed.   b complex vitamins tablet Take 1 tablet by mouth daily.   Calcium  Carbonate (CALCIUM  500 PO) Take 1 tablet by mouth in the morning and at bedtime. Only 2 weeks prior to Prolia  injection and 2 weeks after   Cholecalciferol (VITAMIN D3) 50 MCG (2000 UT) TABS Take 1 tablet by mouth every other day.   denosumab  (PROLIA ) 60 MG/ML SOSY injection Inject 60 mg into the skin every 6 (six) months.   glucose blood (ONETOUCH ULTRA TEST) test  strip 1 each by Other route daily. E11.69   losartan  (COZAAR ) 100 MG tablet Take 1 tablet (100 mg total) by mouth daily.   metFORMIN  (GLUCOPHAGE ) 500 MG tablet TAKE 2 TABLETS BY MOUTH IN THE MORNING AND 1 TABLET WITH EVENING MEAL   metoprolol  tartrate (LOPRESSOR ) 25 MG tablet TAKE 1 TABLET BY MOUTH IN THE MORNING AND 1/2 TABLET IN THE EVENING   mirtazapine  (REMERON ) 30 MG tablet Take 30 mg by mouth at bedtime.   Multiple Vitamins-Minerals (ICAPS AREDS 2) CAPS Take 1 capsule by mouth 2 (two) times daily. Every morning and every evening   Multiple Vitamins-Minerals (WOMENS MULTI GUMMIES PO) Take 1 tablet by mouth in the morning and at bedtime.   omeprazole  (PRILOSEC) 20 MG capsule TAKE 1 CAPSULE BY MOUTH DAILY.   rosuvastatin  (CRESTOR ) 20 MG tablet Take 0.5 tablets (10 mg total) by mouth at bedtime.   Tocilizumab  (ACTEMRA  IV) Inject into the vein every 30 (thirty) days. Administered by rheumatologist for autoimmune disease   XARELTO  2.5 MG TABS tablet TAKE 1 TABLET BY MOUTH 2 TIMES DAILY.   Facility-Administered Encounter Medications as of 08/31/2023  Medication   denosumab  (PROLIA ) injection 60 mg   denosumab  (PROLIA ) injection 60 mg    Allergies (verified) Benadryl [diphenhydramine], Lipitor [atorvastatin], and Tricor [fenofibrate]   History: Past Medical History:  Diagnosis Date   Anemia  per Records from [facility]   Atherosclerosis of aorta [facility])    s/p AO bypass 2004, Per records received from [facility] Rheumatology    Bladder cancer [facility]) 02/10/2010   CAD (coronary artery disease)    Coronary intervention [facility] in 2000.   Diabetes mellitus without complication (HCC)    Former tobacco use    Per records received from [facility]    Former tobacco use    1-2 ppd x 55 years, quit 2013) per Records from [facility]umatology   GCA (giant cell arteritis) (HCC)    Giant cell arteritis (HCC)    Per records received from [facility] Rheumatology     Giant cell arteritis (HCC)    per Records from [facility] Rheumatology   Hyperlipidemia    Hypertension    Macular degeneration    Osteoporosis    Prolia  injection administered on 05/09/2019, Per records received from [facility] Rheumatology    PAD (peripheral artery disease) [facility])    s/p bilateral iliac-femoral artery bypass 2000, Per records received from [facility] Rheumatology    Peripheral vascular disease of lower extremity (HCC)    aortobifemoral bypass grafting at [facility] in [place] in 2004.    Presence of stent in left circumflex coronary artery    Rheumatoid arthritis [facility])    Per records received from [facility] Rheumatology    Past Surgical History:  Procedure Laterality Date   AORTO-FEMORAL BYPASS GRAFT     aortobifemoral bypass grafting at [facility] in [place] in 2004.    CATARACT EXTRACTION     HERNIA REPAIR     KIDNEY SURGERY  04/06/2011   URETER SURGERY  05/05/2011   Family History  Problem Relation Age of Onset   Stroke Mother    Hypertension Mother        Per records received from [facility] Rheumatology    Lung cancer Father    Heart attack Brother    Hyperlipidemia Brother    Hypertension Brother    Diabetes Maternal Uncle    Diabetes Maternal Grandmother    Hypertension Son        Per records received from [facility] Rheumatology   Social History   Socioeconomic History   Marital status: Widowed    Spouse name: Not on file   Number of children: Not on file   Years of education: Not on file   Highest education level: Not on file  Occupational History   Not on file  Tobacco Use   Smoking status: Former    Current packs/day: 0.00    Average packs/day: 1 pack/day for 64.0 years (64.0 ttl pk-yrs)    Types: Cigarettes    Start date: 04/06/1947    Quit date: 04/06/2011    Years since quitting: 12.4   Smokeless tobacco: Never  Vaping Use   Vaping status: Never Used  Substance and Sexual Activity   Alcohol  use: Not Currently   Drug use:  Never   Sexual activity: Not Currently  Other Topics Concern   Not on file  Social History Narrative   Not on file   Social Drivers of Health   Financial Resource Strain: Not on file  Food Insecurity: Not on file  Transportation Needs: Not on file  Physical Activity: Not on file  Stress: Not on file  Social Connections: Not on file    Tobacco Counseling Counseling given: Not Answered   Clinical Intake:  Pre-visit preparation completed: Yes  Pain : No/denies pain     BMI - recorded: 21  Nutritional Risks: None  How often do you need to have someone help you when you read instructions, pamphlets, or other written materials from your doctor or pharmacy?: 3 - Sometimes         Activities of Daily Living    08/31/2023    9:34 AM  In your present state of health, do you have any difficulty performing the following activities:  Hearing? 0  Vision? 1  Difficulty concentrating or making decisions? 0  Walking or climbing stairs? 0  Dressing or bathing? 0  Doing errands, shopping? 1  Preparing Food and eating ? N  Using the Toilet? N  In the past six months, have you accidently leaked urine? Y  Do you have problems with loss of bowel control? N  Managing your Medications? Y  Comment has pill pack that helps  Managing your Finances? Y  Comment daughter helps  Housekeeping or managing your Housekeeping? Y  Comment AL facility    Patient Care Team: Caro Harlene POUR, NP as PCP - General (Geriatric Medicine) Court Dorn PARAS, MD as PCP - Cardiology (Cardiology) Waylan Cain, MD as Consulting Physician (Ophthalmology) Duke, Jon Garre, PA as Physician Assistant (Cardiology) Caleen Dirks, MD as Consulting Physician (Internal Medicine) Raj Rankin SAUNDERS, MD as Referring Physician (Ophthalmology)  Indicate any recent Medical Services you may have received from other than [facility] providers in the past year (date may be approximate).     Assessment:   This is a  routine wellness examination for [place].  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    Goals Addressed   None    Depression Screen    01/14/2023   10:38 AM 09/17/2022   11:03 AM 01/15/2022    2:39 PM 03/06/2021    1:08 PM 01/09/2021    1:30 PM 04/23/2020    2:21 PM 09/04/2019   11:23 AM  PHQ 2/9 Scores  PHQ - 2 Score 0 0 1 0 0 0 0  PHQ- 9 Score   1        Fall Risk    08/31/2023    9:08 AM 01/14/2023   10:38 AM 09/17/2022   11:02 AM 03/15/2022    1:10 PM 01/15/2022    2:39 PM  Fall Risk   Falls in the past year? 0 0 1 0 0  Number falls in past yr: 0 0 0 0 0  Injury with Fall?  0 0 0 0  Risk for fall due to : No Fall Risks No Fall Risks No Fall Risks No Fall Risks No Fall Risks  Follow up Falls evaluation completed Falls evaluation completed Falls evaluation completed Falls evaluation completed Falls evaluation completed      Data saved with a previous flowsheet row definition    MEDICARE RISK AT HOME: Medicare Risk at Home Any stairs in or around the home?: Yes If so, are there any without handrails?: No Home free of loose throw rugs in walkways, pet beds, electrical cords, etc?: Yes Adequate lighting in your home to reduce risk of falls?: Yes Life alert?: Yes Use of a cane, walker or w/c?: No Grab bars in the bathroom?: Yes Shower chair or bench in shower?: Yes Elevated toilet seat or a handicapped toilet?: Yes  TIMED UP AND GO:  Was the test  performed?  No    Cognitive Function:        08/31/2023    9:21 AM 08/31/2023    9:10 AM 01/15/2022    2:43 PM 01/09/2021    1:31 PM 09/04/2019   11:26 AM  6CIT Screen  What Year?  0 points 0 points 0 points 0 points  What month?  0 points 0 points 0 points 0 points  What time?  0 points 0 points 0 points 0 points  Count  back from 20  0 points 2 points 0 points 0 points  Months in reverse -- 4 points 0 points 0 points 0 points  Repeat phrase -- 4 points 0 points 0 points 0 points  Total Score  8 points 2 points 0 points 0 points    Immunizations Immunization History  Administered Date(s) Administered   Fluad Quad(high Dose 65+) 10/24/2019, 12/08/2021   Influenza, High Dose Seasonal PF 11/14/2020, 11/09/2022   Influenza-Unspecified 10/18/2016, 12/08/2018   Moderna Sars-Covid-2 Vaccination 02/21/2019, 03/21/2019, 11/22/2019   Pfizer Covid-19 Vaccine Bivalent Booster 44yrs & up 11/14/2020   Pneumococcal Conjugate (Pcv15) 11/14/2020   Pneumococcal Polysaccharide-23 09/17/2019   Tdap 09/17/2019   Zoster Recombinant(Shingrix) 09/17/2019, 11/22/2019    TDAP status: Up to date  Flu Vaccine status: Up to date  Pneumococcal vaccine status: Up to date  Covid-19 vaccine status: Information provided on how to obtain vaccines.   Qualifies for Shingles Vaccine? Yes   Zostavax completed No   Shingrix Completed?: Yes  Screening Tests Health Maintenance  Topic Date Due   OPHTHALMOLOGY EXAM  08/10/2023   INFLUENZA VACCINE  09/09/2023   HEMOGLOBIN A1C  12/18/2023   FOOT EXAM  07/17/2024   Medicare Annual Wellness (AWV)  08/30/2024   DTaP/Tdap/Td (2 - Td or Tdap) 09/16/2029   Pneumococcal Vaccine: 50+ Years  Completed   DEXA SCAN  Completed   Zoster Vaccines- Shingrix  Completed   Hepatitis B Vaccines  Aged Out   HPV VACCINES  Aged Out   Meningococcal B Vaccine  Aged Out   COVID-19 Vaccine  Discontinued    Health Maintenance  Health Maintenance Due  Topic Date Due   OPHTHALMOLOGY EXAM  08/10/2023    Colorectal cancer screening: No longer required.   Mammogram status: No longer required due to age.  Bone Density status: Ordered today. Pt provided with contact info and advised to call to schedule appt.  Lung Cancer Screening: (Low Dose CT Chest recommended if Age 54-80 years, 20 pack-year  currently smoking OR have quit w/in 15years.) does not qualify.   Lung Cancer Screening Referral: na  Additional Screening:  Hepatitis C Screening: does not qualify; Completed   Vision Screening: Recommended annual ophthalmology exams for early detection of glaucoma and other disorders of the eye. Is the patient up to date with their annual eye exam?  Yes  Who is the provider or what is the name of the office in which the patient attends annual eye exams? [facility]  If pt is not established with a provider, would they like to be referred to a provider to establish care? Yes .   Dental Screening: Recommended annual dental exams for proper oral hygiene  Diabetic Foot Exam: Diabetic Foot Exam: Completed 07/2024  Community Resource Referral / Chronic Care Management: CRR required this visit?  No   CCM required this visit?  No     Plan:     I have personally reviewed and noted the following in the patient's chart:  Medical and social history Use of alcohol , tobacco or illicit drugs  Current medications and supplements including opioid prescriptions. Patient is not currently taking opioid prescriptions. Functional ability and status Nutritional status Physical activity Advanced directives List of other physicians Hospitalizations, surgeries, and ER visits in previous 12 months Vitals Screenings to include cognitive, depression, and falls Referrals and appointments  In addition, I have reviewed and discussed with patient certain preventive protocols, quality metrics, and best practice recommendations. A written personalized care plan for preventive services as well as general preventive health recommendations were provided to patient.     Harlene MARLA An, NP   08/31/2023

## 2023-09-01 ENCOUNTER — Ambulatory Visit: Payer: Self-pay | Admitting: Nurse Practitioner

## 2023-09-03 LAB — CBC WITH DIFFERENTIAL/PLATELET
Absolute Lymphocytes: 1315 {cells}/uL (ref 850–3900)
Absolute Monocytes: 649 {cells}/uL (ref 200–950)
Basophils Absolute: 11 {cells}/uL (ref 0–200)
Basophils Relative: 0.2 %
Eosinophils Absolute: 149 {cells}/uL (ref 15–500)
Eosinophils Relative: 2.7 %
HCT: 38.7 % (ref 35.0–45.0)
Hemoglobin: 12.7 g/dL (ref 11.7–15.5)
MCH: 29.7 pg (ref 27.0–33.0)
MCHC: 32.8 g/dL (ref 32.0–36.0)
MCV: 90.6 fL (ref 80.0–100.0)
MPV: 11 fL (ref 7.5–12.5)
Monocytes Relative: 11.8 %
Neutro Abs: 3377 {cells}/uL (ref 1500–7800)
Neutrophils Relative %: 61.4 %
Platelets: 149 Thousand/uL (ref 140–400)
RBC: 4.27 Million/uL (ref 3.80–5.10)
RDW: 17 % — ABNORMAL HIGH (ref 11.0–15.0)
Total Lymphocyte: 23.9 %
WBC: 5.5 Thousand/uL (ref 3.8–10.8)

## 2023-09-03 LAB — LIPID PANEL
Cholesterol: 141 mg/dL (ref <200)
HDL: 46 mg/dL — ABNORMAL LOW (ref >=50)
LDL Cholesterol (Calc): 63 mg/dL
Non-HDL Cholesterol (Calc): 95 mg/dL (ref <130)
Total CHOL/HDL Ratio: 3.1 (calc) (ref <5.0)
Triglycerides: 275 mg/dL — ABNORMAL HIGH (ref <150)

## 2023-09-03 LAB — COMPREHENSIVE METABOLIC PANEL WITH GFR
AG Ratio: 2.3 (calc) (ref 1.0–2.5)
ALT: 18 U/L (ref 6–29)
AST: 16 U/L (ref 10–35)
Albumin: 4.6 g/dL (ref 3.6–5.1)
Alkaline phosphatase (APISO): 47 U/L (ref 37–153)
BUN/Creatinine Ratio: 20 (calc) (ref 6–22)
BUN: 28 mg/dL — ABNORMAL HIGH (ref 7–25)
CO2: 25 mmol/L (ref 20–32)
Calcium: 9.9 mg/dL (ref 8.6–10.4)
Chloride: 106 mmol/L (ref 98–110)
Creat: 1.42 mg/dL — ABNORMAL HIGH (ref 0.60–0.95)
Globulin: 2 g/dL (ref 1.9–3.7)
Glucose, Bld: 122 mg/dL — ABNORMAL HIGH (ref 65–99)
Potassium: 4.4 mmol/L (ref 3.5–5.3)
Sodium: 141 mmol/L (ref 135–146)
Total Bilirubin: 0.6 mg/dL (ref 0.2–1.2)
Total Protein: 6.6 g/dL (ref 6.1–8.1)
eGFR: 36 mL/min/1.73m2 — ABNORMAL LOW (ref >=60)

## 2023-09-03 LAB — TEST AUTHORIZATION

## 2023-09-03 LAB — IRON,TIBC AND FERRITIN PANEL
%SAT: 15 % — ABNORMAL LOW (ref 16–45)
Ferritin: 20 ng/mL (ref 16–288)
Iron: 54 ug/dL (ref 45–160)
TIBC: 361 ug/dL (ref 250–450)

## 2023-09-06 ENCOUNTER — Other Ambulatory Visit: Payer: Self-pay | Admitting: Nurse Practitioner

## 2023-09-06 DIAGNOSIS — N1832 Chronic kidney disease, stage 3b: Secondary | ICD-10-CM

## 2023-09-27 DIAGNOSIS — M316 Other giant cell arteritis: Secondary | ICD-10-CM | POA: Diagnosis not present

## 2023-09-27 DIAGNOSIS — Z79899 Other long term (current) drug therapy: Secondary | ICD-10-CM | POA: Diagnosis not present

## 2023-09-27 DIAGNOSIS — M0609 Rheumatoid arthritis without rheumatoid factor, multiple sites: Secondary | ICD-10-CM | POA: Diagnosis not present

## 2023-09-29 DIAGNOSIS — H353211 Exudative age-related macular degeneration, right eye, with active choroidal neovascularization: Secondary | ICD-10-CM | POA: Diagnosis not present

## 2023-09-29 DIAGNOSIS — E119 Type 2 diabetes mellitus without complications: Secondary | ICD-10-CM | POA: Diagnosis not present

## 2023-09-29 DIAGNOSIS — H353122 Nonexudative age-related macular degeneration, left eye, intermediate dry stage: Secondary | ICD-10-CM | POA: Diagnosis not present

## 2023-09-29 DIAGNOSIS — H47012 Ischemic optic neuropathy, left eye: Secondary | ICD-10-CM | POA: Diagnosis not present

## 2023-09-29 DIAGNOSIS — H35372 Puckering of macula, left eye: Secondary | ICD-10-CM | POA: Diagnosis not present

## 2023-09-29 DIAGNOSIS — H43813 Vitreous degeneration, bilateral: Secondary | ICD-10-CM | POA: Diagnosis not present

## 2023-10-13 DIAGNOSIS — M0609 Rheumatoid arthritis without rheumatoid factor, multiple sites: Secondary | ICD-10-CM | POA: Diagnosis not present

## 2023-10-13 DIAGNOSIS — D649 Anemia, unspecified: Secondary | ICD-10-CM | POA: Diagnosis not present

## 2023-10-13 DIAGNOSIS — Z79899 Other long term (current) drug therapy: Secondary | ICD-10-CM | POA: Diagnosis not present

## 2023-10-13 DIAGNOSIS — M316 Other giant cell arteritis: Secondary | ICD-10-CM | POA: Diagnosis not present

## 2023-10-13 DIAGNOSIS — Z6821 Body mass index (BMI) 21.0-21.9, adult: Secondary | ICD-10-CM | POA: Diagnosis not present

## 2023-10-25 DIAGNOSIS — M0609 Rheumatoid arthritis without rheumatoid factor, multiple sites: Secondary | ICD-10-CM | POA: Diagnosis not present

## 2023-10-25 DIAGNOSIS — M316 Other giant cell arteritis: Secondary | ICD-10-CM | POA: Diagnosis not present

## 2023-11-08 DIAGNOSIS — Z23 Encounter for immunization: Secondary | ICD-10-CM | POA: Diagnosis not present

## 2023-11-16 DIAGNOSIS — E119 Type 2 diabetes mellitus without complications: Secondary | ICD-10-CM | POA: Diagnosis not present

## 2023-11-16 DIAGNOSIS — E785 Hyperlipidemia, unspecified: Secondary | ICD-10-CM | POA: Diagnosis not present

## 2023-11-16 DIAGNOSIS — I10 Essential (primary) hypertension: Secondary | ICD-10-CM | POA: Diagnosis not present

## 2023-11-16 DIAGNOSIS — K219 Gastro-esophageal reflux disease without esophagitis: Secondary | ICD-10-CM | POA: Diagnosis not present

## 2023-11-22 DIAGNOSIS — M316 Other giant cell arteritis: Secondary | ICD-10-CM | POA: Diagnosis not present

## 2023-11-22 DIAGNOSIS — M0609 Rheumatoid arthritis without rheumatoid factor, multiple sites: Secondary | ICD-10-CM | POA: Diagnosis not present

## 2023-12-01 ENCOUNTER — Other Ambulatory Visit: Payer: Self-pay

## 2023-12-01 ENCOUNTER — Telehealth: Payer: Self-pay | Admitting: *Deleted

## 2023-12-01 MED ORDER — JUBBONTI 60 MG/ML ~~LOC~~ SOSY
60.0000 mg | PREFILLED_SYRINGE | SUBCUTANEOUS | 1 refills | Status: AC
  Filled 2023-12-01 (×2): qty 1, 180d supply, fill #0

## 2023-12-01 NOTE — Telephone Encounter (Signed)
 Prolia  submitted through [employer] Also submitted Generic Prolia  Jubbonti to [facility] pharmacy to check Pharmacy Benefit.  LM for patient.

## 2023-12-01 NOTE — Progress Notes (Signed)
 Pharmacy Patient Advocate Encounter  Insurance verification completed.   The patient is insured through [employer] claim for Jubbonti . Co-pay is $0.  This test claim was processed through [facility]- copay amounts may vary at other pharmacies due to pharmacy/plan contracts, or as the patient moves through the different stages of their insurance plan.

## 2023-12-01 NOTE — Progress Notes (Signed)
 Specialty Pharmacy Initial Fill Coordination Note  [name] is a 89 y.o. female contacted today regarding initial fill of specialty medication(s) Denosumab -bbdz (Jubbonti)   Patient requested Courier to Provider Office   Delivery date: 01/16/24   Verified address: [facility]   Medication will be filled on 12/5.   Patient is aware of $0 copayment.

## 2023-12-07 NOTE — Telephone Encounter (Signed)
 Putman, Tiffany L, CPhT Pharmacologist)             Specialty Pharmacy Initial Fill Coordination Note   Anne Pena is a 89 y.o. female contacted today regarding initial fill of specialty medication(s) Denosumab -bbdz (Jubbonti)     Patient requested Courier to Provider Office   Delivery date: 01/16/24   Verified address: [facility]     Medication will be filled on 12/5.    Patient is aware of $0 copayment.

## 2023-12-16 DIAGNOSIS — E785 Hyperlipidemia, unspecified: Secondary | ICD-10-CM | POA: Diagnosis not present

## 2023-12-16 DIAGNOSIS — I10 Essential (primary) hypertension: Secondary | ICD-10-CM | POA: Diagnosis not present

## 2023-12-16 DIAGNOSIS — K219 Gastro-esophageal reflux disease without esophagitis: Secondary | ICD-10-CM | POA: Diagnosis not present

## 2023-12-16 DIAGNOSIS — E119 Type 2 diabetes mellitus without complications: Secondary | ICD-10-CM | POA: Diagnosis not present

## 2023-12-20 DIAGNOSIS — M0609 Rheumatoid arthritis without rheumatoid factor, multiple sites: Secondary | ICD-10-CM | POA: Diagnosis not present

## 2023-12-20 DIAGNOSIS — M316 Other giant cell arteritis: Secondary | ICD-10-CM | POA: Diagnosis not present

## 2024-01-09 DIAGNOSIS — K219 Gastro-esophageal reflux disease without esophagitis: Secondary | ICD-10-CM | POA: Diagnosis not present

## 2024-01-09 DIAGNOSIS — I10 Essential (primary) hypertension: Secondary | ICD-10-CM | POA: Diagnosis not present

## 2024-01-09 DIAGNOSIS — E119 Type 2 diabetes mellitus without complications: Secondary | ICD-10-CM | POA: Diagnosis not present

## 2024-01-09 DIAGNOSIS — E785 Hyperlipidemia, unspecified: Secondary | ICD-10-CM | POA: Diagnosis not present

## 2024-01-13 ENCOUNTER — Other Ambulatory Visit: Payer: Self-pay

## 2024-01-16 ENCOUNTER — Telehealth: Payer: Self-pay

## 2024-01-16 NOTE — Telephone Encounter (Signed)
 Anne Pena  received by [facility] courier  1.) Patient already has lab appointment on 01/17/24 2.) Patient already has appointment with provider on 01/20/24  for follow-up and to receive injection

## 2024-01-17 ENCOUNTER — Other Ambulatory Visit: Payer: Self-pay

## 2024-01-17 DIAGNOSIS — M0609 Rheumatoid arthritis without rheumatoid factor, multiple sites: Secondary | ICD-10-CM | POA: Diagnosis not present

## 2024-01-17 DIAGNOSIS — M316 Other giant cell arteritis: Secondary | ICD-10-CM | POA: Diagnosis not present

## 2024-01-19 DIAGNOSIS — H35372 Puckering of macula, left eye: Secondary | ICD-10-CM | POA: Diagnosis not present

## 2024-01-19 DIAGNOSIS — H353122 Nonexudative age-related macular degeneration, left eye, intermediate dry stage: Secondary | ICD-10-CM | POA: Diagnosis not present

## 2024-01-19 DIAGNOSIS — H353211 Exudative age-related macular degeneration, right eye, with active choroidal neovascularization: Secondary | ICD-10-CM | POA: Diagnosis not present

## 2024-01-19 DIAGNOSIS — E119 Type 2 diabetes mellitus without complications: Secondary | ICD-10-CM | POA: Diagnosis not present

## 2024-01-19 DIAGNOSIS — H47012 Ischemic optic neuropathy, left eye: Secondary | ICD-10-CM | POA: Diagnosis not present

## 2024-01-19 DIAGNOSIS — H43813 Vitreous degeneration, bilateral: Secondary | ICD-10-CM | POA: Diagnosis not present

## 2024-01-19 LAB — OPHTHALMOLOGY REPORT-SCANNED

## 2024-01-20 ENCOUNTER — Encounter: Payer: Self-pay | Admitting: Nurse Practitioner

## 2024-01-20 ENCOUNTER — Ambulatory Visit: Payer: Self-pay | Admitting: Nurse Practitioner

## 2024-01-20 VITALS — BP 124/74 | HR 79 | Temp 97.1°F | Resp 18 | Ht 61.0 in | Wt 113.6 lb

## 2024-01-20 DIAGNOSIS — E782 Mixed hyperlipidemia: Secondary | ICD-10-CM

## 2024-01-20 DIAGNOSIS — M316 Other giant cell arteritis: Secondary | ICD-10-CM

## 2024-01-20 DIAGNOSIS — Z95828 Presence of other vascular implants and grafts: Secondary | ICD-10-CM

## 2024-01-20 DIAGNOSIS — F320 Major depressive disorder, single episode, mild: Secondary | ICD-10-CM

## 2024-01-20 DIAGNOSIS — N1831 Chronic kidney disease, stage 3a: Secondary | ICD-10-CM

## 2024-01-20 DIAGNOSIS — I10 Essential (primary) hypertension: Secondary | ICD-10-CM

## 2024-01-20 DIAGNOSIS — M810 Age-related osteoporosis without current pathological fracture: Secondary | ICD-10-CM | POA: Diagnosis not present

## 2024-01-20 DIAGNOSIS — D509 Iron deficiency anemia, unspecified: Secondary | ICD-10-CM

## 2024-01-20 MED ORDER — DENOSUMAB-BBDZ 60 MG/ML ~~LOC~~ SOSY
60.0000 mg | PREFILLED_SYRINGE | Freq: Once | SUBCUTANEOUS | Status: AC
  Administered 2024-01-20: 60 mg via SUBCUTANEOUS

## 2024-01-20 NOTE — Progress Notes (Signed)
 Careteam: Patient Care Team: Caro Harlene POUR, NP as PCP - General (Geriatric Medicine) Court Dorn PARAS, MD as PCP - Cardiology (Cardiology) Waylan Cain, MD as Consulting Physician (Ophthalmology) Duke, Jon Garre, PA as Physician Assistant (Cardiology) Caleen Dirks, MD as Consulting Physician (Internal Medicine) Raj Rankin SAUNDERS, MD as Referring Physician (Ophthalmology) Ishmael Jon, MD as Consulting Physician (Rheumatology)  PLACE OF SERVICE:  [facility]  Advanced Directive information    Allergies[1]  Chief Complaint  Patient presents with   Medical Management of Chronic Issues    Six Months Follow-up for routine follow up / Jubbonti  Injection-[facility] to deliver to Office.     HPI:  Discussed the use of AI scribe software for clinical note transcription with the patient, who gave verbal consent to proceed.  History of Present Illness Anne Pena is an 89 year old female who presents for a six-month follow-up visit.  During this visit, her lab work from the rheumatologist's office will be reviewed, and she is scheduled to receive her Prolia  injection. Her calcium  levels are confirmed to be normal.  She is followed by rheumatology due to GCA and RA  Recent lab results show creatinine levels increasing from 1.42 in July to 1.54 currently, and a GFR of 36 on the last labs. She has not yet seen a nephrologist for her kidney function issues.  Her triglycerides are slightly elevated at 275, but her LDL is well-controlled at 63. She is currently taking Crestor  10 mg for cholesterol management.  She is on metformin  for diabetes management, taking 500 mg 2 tablets in am and 1 tablet in PM. Her A1c is stable at 6.9.  She continues to take Xarelto  due to hx of aortobifemoral bypass grafting in 2004, she is without any unusual bruising or bleeding.   She has a history of iron deficiency anemia, but her hemoglobin levels have been stable recently.  She  is on Remeron  for mood and sleep, which she reports is helping.   No chest pains, palpitations, cough, congestion, or shortness of breath. Bowel movements are normal. No blood in stools.   Review of Systems:  Review of Systems  Constitutional:  Negative for chills, fever and weight loss.  HENT:  Negative for tinnitus.   Respiratory:  Negative for cough, sputum production and shortness of breath.   Cardiovascular:  Negative for chest pain, palpitations and leg swelling.  Gastrointestinal:  Negative for abdominal pain, constipation, diarrhea and heartburn.  Genitourinary:  Negative for dysuria, frequency and urgency.  Musculoskeletal:  Negative for back pain, falls, joint pain and myalgias.  Skin: Negative.   Neurological:  Negative for dizziness and headaches.  Psychiatric/Behavioral:  Negative for depression and memory loss. The patient does not have insomnia.     Past Medical History:  Diagnosis Date   Anemia    per Records from [facility] Rhe[facility]ology   Atherosclerosis of aorta    s/p AO bypass 2004, Per records received from [facility]    Bladder cancer [facility]) 02/10/2010   CAD (coronary artery disease)    Coronary intervention [facility] in 2000.   Diabetes mellitus without complication (HCC)    Former tobacco use    Per records received from [facility] Rheumatology    Former tobacco use    1-2 ppd x 55 years, quit 2013) per Records from [facility] Rheumatology   GCA (giant cell arteritis) (HCC)    Giant cell arteritis (HCC)    Per records received from [facility] Rheumatology    Giant cell  arteritis (HCC)    per Records from [facility] Rhe[facility]   Hyperlipidemia    Hypertension    Macular degeneration    Osteoporosis    Prolia  injection administered on 05/09/2019, Per records received from [facility] Rheumatology    PAD (peripheral artery disease)    s/p bilateral iliac-femoral artery bypass 2000, Per records received from [facility] Rheumatology     Peripheral vascular disease of lower extremity    aortobifemoral bypass grafting at [facility] in [place] in 2004.    Presence of stent in left circumflex coronary artery    Rheumatoid arthritis [facility])    Per records received from [facility] Rheumatology    Past Surgical History:  Procedure Laterality Date   AORTO-FEMORAL BYPASS GRAFT     aortobifemoral bypass grafting at [facility] in [place] in 2004.    CATARACT EXTRACTION     HERNIA REPAIR     KIDNEY SURGERY  04/06/2011   URETER SURGERY  05/05/2011   Social History:   reports that she quit smoking about 12 years ago. Her smoking use included cigarettes. She started smoking about 76 years ago. She has a 64 pack-year smoking history. She has never used smokeless tobacco. She reports that she does not currently use alcohol . She reports that she does not use drugs.  Family History  Problem Relation Age of Onset   Stroke Mother    Hypertension Mother        Per records received from [facility] Rheumatology    Lung cancer Father    Heart attack Brother    Hyperlipidemia Brother    Hypertension Brother    Diabetes Maternal Uncle    Diabetes Maternal Grandmother    Hypertension Son        Per records received from [place] Rheumatology    Medications: Patient's Medications  New Prescriptions   No medications on file  Previous Medications   ACETAMINOPHEN (TYLENOL) 325 MG TABLET    Take 650 mg by mouth every 6 (six) hours as needed.   B COMPLEX VITAMINS TABLET    Take 1 tablet by mouth daily.   CALCIUM  CARBONATE (CALCIUM  500 PO)    Take 1 tablet by mouth in the morning and at bedtime. Only 2 weeks prior to Prolia  injection and 2 weeks after   CHOLECALCIFEROL (VITAMIN D3) 50 MCG (2000 UT) TABS    Take 1 tablet by mouth every other day.   DENOSUMAB  (PROLIA ) 60 MG/ML SOSY INJECTION    Inject 60 mg into the skin every 6 (six) months.   DENOSUMAB -BBDZ (JUBBONTI ) 60 MG/ML SOSY INJECTION    Inject 60 mg into the skin every 6 (six)  months.   GLUCOSE BLOOD (ONETOUCH ULTRA TEST) TEST STRIP    1 each by Other route daily. E11.69   LOSARTAN  (COZAAR ) 100 MG TABLET    Take 1 tablet (100 mg total) by mouth daily.   METFORMIN  (GLUCOPHAGE ) 500 MG TABLET    TAKE 2 TABLETS BY MOUTH IN THE MORNING AND 1 TABLET WITH EVENING MEAL   METOPROLOL  TARTRATE (LOPRESSOR ) 25 MG TABLET    TAKE 1 TABLET BY MOUTH IN THE MORNING AND 1/2 TABLET IN THE EVENING   MIRTAZAPINE  (REMERON ) 30 MG TABLET    Take 30 mg by mouth at bedtime.   MULTIPLE VITAMINS-MINERALS (ICAPS AREDS 2) CAPS    Take 1 capsule by mouth 2 (two) times daily. Every morning and every evening   MULTIPLE VITAMINS-MINERALS (WOMENS MULTI GUMMIES PO)    Take 1 tablet by mouth  in the morning and at bedtime.   OMEPRAZOLE  (PRILOSEC) 20 MG CAPSULE    TAKE 1 CAPSULE BY MOUTH DAILY.   ROSUVASTATIN  (CRESTOR ) 20 MG TABLET    Take 0.5 tablets (10 mg total) by mouth at bedtime.   TOCILIZUMAB  (ACTEMRA  IV)    Inject into the vein every 30 (thirty) days. Administered by rheumatologist for autoimmune disease   XARELTO  2.5 MG TABS TABLET    TAKE 1 TABLET BY MOUTH 2 TIMES DAILY.  Modified Medications   No medications on file  Discontinued Medications   No medications on file    Physical Exam:  Vitals:   01/20/24 1054  BP: 124/74  Pulse: 79  Resp: 18  Temp: (!) 97.1 F (36.2 C)  SpO2: 95%  Weight: 113 lb 9.6 oz (51.5 kg)  Height: 5' 1 (1.549 m)   Body mass index is 21.46 kg/m. Wt Readings from Last 3 Encounters:  01/20/24 113 lb 9.6 oz (51.5 kg)  08/31/23 111 lb 9.6 oz (50.6 kg)  07/18/23 112 lb 12.8 oz (51.2 kg)    Physical Exam Constitutional:      General: She is not in acute distress.    Appearance: She is well-developed. She is not diaphoretic.  HENT:     Head: Normocephalic and atraumatic.     Mouth/Throat:     Pharynx: No oropharyngeal exudate.  Eyes:     Conjunctiva/sclera: Conjunctivae normal.     Pupils: Pupils are equal, round, and reactive to light.   Cardiovascular:     Rate and Rhythm: Normal rate and regular rhythm.     Heart sounds: Normal heart sounds.  Pulmonary:     Effort: Pulmonary effort is normal.     Breath sounds: Normal breath sounds.  Abdominal:     General: Bowel sounds are normal.     Palpations: Abdomen is soft.  Musculoskeletal:     Cervical back: Normal range of motion and neck supple.     Right lower leg: No edema.     Left lower leg: No edema.  Skin:    General: Skin is warm and dry.  Neurological:     Mental Status: She is alert.  Psychiatric:        Mood and Affect: Mood normal.     Labs reviewed: Basic Metabolic Panel: Recent Labs    06/17/23 1330 08/31/23 0950  NA 142 141  141  K 4.5 4.4  4.4  CL 105 106  106  CO2 26 25  25   GLUCOSE 86 122*  122*  BUN 31* 28*  28*  CREATININE 1.49* 1.42*  1.42*  CALCIUM  10.4 9.9  9.9   Liver Function Tests: Recent Labs    06/17/23 1330 08/31/23 0950  AST 17 16  16   ALT 17 18  18   BILITOT 0.5 0.6  0.6  PROT 6.9 6.6  6.6   No results for input(s): LIPASE, AMYLASE in the last 8760 hours. No results for input(s): AMMONIA in the last 8760 hours. CBC: Recent Labs    06/17/23 1330 08/31/23 0950  WBC 7.2 5.5  NEUTROABS 4,630 3,377  HGB 11.0* 12.7  HCT 35.7 38.7  MCV 81.7 90.6  PLT 201 149   Lipid Panel: Recent Labs    08/31/23 0950  CHOL 141  HDL 46*  LDLCALC 63  TRIG 724*  CHOLHDL 3.1   TSH: No results for input(s): TSH in the last 8760 hours. A1C: Lab Results  Component Value Date   HGBA1C 6.9 (H)  06/17/2023     Assessment/Plan Assessment and Plan Assessment & Plan Diabetes mellitus due to underlying condition with stage 3 chronic kidney disease Diabetes management complicated by stage 3 CKD. Slight worsening of kidney function noted. A1c stable at 6.9. Metformin  dose adjustment considered if GFR falls below 30. - Checked A1c today. - Rechecked kidney function. - Adjust metformin  dose if GFR falls below  30. - Ensure adequate hydration.  Osteoporosis Prolia  management ongoing with normal calcium  levels. - Administered Prolia  injection today.  Iron deficiency anemia Well-controlled with normal hemoglobin levels. - Rechecked hemoglobin today.  Mixed hyperlipidemia LDL well-controlled at 63 with rosuvastatin . - Continue rosuvastatin  10 mg daily.  Major depressive disorder, single episode, mild Mood and sleep well-managed with Remeron . - Continue Remeron  for mood and sleep.  Hypertension Controlled on current regimen Continues on losartan  and metoprolol     GCA (giant cell arteritis) (HCC) Followed by rheumatology    S/P aorto-bifemoral bypass surgery Continues on xarelto , monitor cbc  General Health Maintenance Routine health maintenance includes lab monitoring and medication management. - Checked electrolytes today. - Ensure labs are done before next Prolia  visit. - Use rheumatologist labs if within 30 days of visit.   Return in about 6 months (around 07/20/2024) for routine follow up, labs prior to visit.: Kalvyn Desa K. Caro BODILY [facility] [phone]      [1]  Allergies Allergen Reactions   Benadryl [Diphenhydramine]     Side effects per records received from [facility]    Lipitor [Atorvastatin]     rash   Tricor [Fenofibrate]     rash

## 2024-01-21 LAB — COMPREHENSIVE METABOLIC PANEL WITH GFR
AG Ratio: 2.5 (calc) (ref 1.0–2.5)
ALT: 18 U/L (ref 6–29)
AST: 20 U/L (ref 10–35)
Albumin: 4.8 g/dL (ref 3.6–5.1)
Alkaline phosphatase (APISO): 54 U/L (ref 37–153)
BUN/Creatinine Ratio: 17 (calc) (ref 6–22)
BUN: 27 mg/dL — ABNORMAL HIGH (ref 7–25)
CO2: 27 mmol/L (ref 20–32)
Calcium: 10.3 mg/dL (ref 8.6–10.4)
Chloride: 107 mmol/L (ref 98–110)
Creat: 1.6 mg/dL — ABNORMAL HIGH (ref 0.60–0.95)
Globulin: 1.9 g/dL (ref 1.9–3.7)
Glucose, Bld: 81 mg/dL (ref 65–139)
Potassium: 5 mmol/L (ref 3.5–5.3)
Sodium: 142 mmol/L (ref 135–146)
Total Bilirubin: 0.5 mg/dL (ref 0.2–1.2)
Total Protein: 6.7 g/dL (ref 6.1–8.1)
eGFR: 31 mL/min/1.73m2 — ABNORMAL LOW (ref >=60)

## 2024-01-21 LAB — HEMOGLOBIN A1C
Hgb A1c MFr Bld: 6.7 % — ABNORMAL HIGH (ref <5.7)
Mean Plasma Glucose: 146 mg/dL
eAG (mmol/L): 8.1 mmol/L

## 2024-01-23 ENCOUNTER — Ambulatory Visit: Payer: Self-pay | Admitting: Nurse Practitioner

## 2024-01-23 DIAGNOSIS — E1169 Type 2 diabetes mellitus with other specified complication: Secondary | ICD-10-CM

## 2024-01-23 NOTE — Telephone Encounter (Signed)
 Medication list updated.

## 2024-02-08 MED ORDER — SITAGLIPTIN PHOSPHATE 25 MG PO TABS: 25.0000 mg | ORAL_TABLET | Freq: Every day | ORAL | 1 refills | Status: AC

## 2024-02-08 NOTE — Telephone Encounter (Signed)
 Please fax

## 2024-02-08 NOTE — Addendum Note (Signed)
 Addended by: Kellie Murrill K on: 02/08/2024 08:47 AM   Modules accepted: Orders

## 2024-04-13 ENCOUNTER — Ambulatory Visit (HOSPITAL_COMMUNITY)

## 2024-07-17 ENCOUNTER — Other Ambulatory Visit

## 2024-07-20 ENCOUNTER — Ambulatory Visit: Admitting: Nurse Practitioner

## 2024-08-31 ENCOUNTER — Encounter: Payer: Self-pay | Admitting: Nurse Practitioner
